# Patient Record
Sex: Male | Born: 1955 | Race: Black or African American | Hispanic: No | Marital: Single | Smoking: Former smoker
Health system: Southern US, Community
[De-identification: ages and names within clinical notes are randomized; demographics above are authoritative.]

## PROBLEM LIST (undated history)

## (undated) DIAGNOSIS — Z789 Other specified health status: Secondary | ICD-10-CM

## (undated) DIAGNOSIS — K8020 Calculus of gallbladder without cholecystitis without obstruction: Secondary | ICD-10-CM

## (undated) HISTORY — PX: PANCREAS SURGERY: SHX731

## (undated) HISTORY — DX: Other specified health status: Z78.9

## (undated) HISTORY — DX: Calculus of gallbladder without cholecystitis without obstruction: K80.20

## (undated) NOTE — *Deleted (*Deleted)
Patient ID: Tanner Thomas, male   DOB: 06/03/1955, 42 y.o.   MRN: [id number]    Progress Note   Subjective  Day # 2   Objective   Vital signs in last 24 hours: Temp:  [92.3 F (33.5 C)-98.6 F (37 C)] 98.6 F (37 C) (10/15 0624) Pulse Rate:  [45-65] 59 (10/15 0624) Resp:  [10-18] 18 (10/15 0624) BP: (129-168)/(69-91) 137/88 (10/15 0624) SpO2:  [98 %-100 %] 99 % (10/15 0624) Weight:  [66.2 kg] 66.2 kg (10/14 0947) Last BM Date: 02/04/20 General:    white male in NAD Heart:  Regular rate and rhythm; no murmurs Lungs: Respirations even and unlabored, lungs CTA bilaterally Abdomen:  Soft, nontender and nondistended. Normal bowel sounds. Extremities:  Without edema. Neurologic:  Alert and oriented,  grossly normal neurologically. Psych:  Cooperative. Normal mood and affect.  Intake/Output from previous day: 10/14 0701 - 10/15 0700 In: 1059.9 [P.O.:75; I.V.:984.9] Out: 1900 [Urine:1900] Intake/Output this shift: No intake/output data recorded.  Lab Results: Recent Labs    02/05/20 1620 02/05/20 2119 02/06/20 0429  WBC 8.0  --   --   HGB 13.6 12.8* 13.1  HCT 41.8 38.1* 40.1  PLT 112*  --   --    BMET Recent Labs    02/05/20 1620  NA 136  K 3.9  CL 104  CO2 22  GLUCOSE 128*  BUN 13  CREATININE 0.84  CALCIUM 8.7*   LFT Recent Labs    02/05/20 1620  PROT 7.7  ALBUMIN 4.0  AST 25  ALT 13  ALKPHOS 62  BILITOT 0.8   PT/INR No results for input(s): LABPROT, INR in the last 72 hours.  Studies/Results: DG Abd 1 View - KUB  Result Date: 02/05/2020 CLINICAL DATA:  Post ERCP.  Acute pancreatitis. EXAM: ABDOMEN - 1 VIEW COMPARISON:  CT abdomen pelvis 01/16/2019 FINDINGS: Normal bowel gas pattern. Small pancreatic calcifications noted in the head of the pancreas. Biliary stent noted. Cholecystectomy clips. Calcifications on either side of the L3 vertebral body appear to be in the anterior abdominal wall based on prior CT. IMPRESSION: No acute abnormality.  Pancreatic calcifications compatible with chronic pancreatitis. Biliary stent in good position. Electronically Signed   By: Marlan Palau M.D.   On: 02/05/2020 13:50   DG CHEST PORT 1 VIEW  Result Date: 02/05/2020 CLINICAL DATA:  Post ERCP.  Acute pancreatitis. EXAM: PORTABLE CHEST 1 VIEW COMPARISON:  01/19/2019 FINDINGS: The heart size and mediastinal contours are within normal limits. Both lungs are clear. The visualized skeletal structures are unremarkable. Surgical staples and scarring right apex. IMPRESSION: No active disease. Electronically Signed   By: Marlan Palau M.D.   On: 02/05/2020 13:52   DG ERCP  Result Date: 02/05/2020 CLINICAL DATA:  ERCP with sphincterotomy and stone extraction. Stent exchange for CBD stricture. EXAM: ERCP TECHNIQUE: Multiple spot images obtained with the fluoroscopic device and submitted for interpretation post-procedure. COMPARISON:  KUB 02/05/2020 FINDINGS: Plastic biliary stent in place placed in the common bile the beginning duct and contrast injected of the procedure. Wire placed into the common bile duct which is dilated with multiple filling defects consistent with calculi. Cholecystectomy clips. Metal stent was placed at the end of the procedure in the common bile duct. IMPRESSION: Dilated common bile duct with multiple filling defects compatible with stones. Plastic stent removed and metal stent placed in the common bile duct. These images were submitted for radiologic interpretation only. Please see the procedural report for the amount of contrast  and the fluoroscopy time utilized. Electronically Signed   By: Marlan Palau M.D.   On: 02/05/2020 13:55       Assessment / Plan:   ***        Active Problems:   Acute upper GI bleed     LOS: 0 days   Amy Esterwood PA-C 02/06/2020, 8:40 AM

---

## 2018-12-23 ENCOUNTER — Other Ambulatory Visit: Payer: Self-pay

## 2018-12-23 ENCOUNTER — Encounter (HOSPITAL_COMMUNITY): Payer: Self-pay | Admitting: Emergency Medicine

## 2018-12-23 ENCOUNTER — Emergency Department (HOSPITAL_COMMUNITY)
Admission: EM | Admit: 2018-12-23 | Discharge: 2018-12-23 | Disposition: A | Discharge status: Home / Self Care | Payer: Self-pay | Attending: Emergency Medicine | Admitting: Emergency Medicine

## 2018-12-23 ENCOUNTER — Emergency Department (HOSPITAL_COMMUNITY): Payer: Self-pay

## 2018-12-23 DIAGNOSIS — F17210 Nicotine dependence, cigarettes, uncomplicated: Secondary | ICD-10-CM | POA: Insufficient documentation

## 2018-12-23 DIAGNOSIS — M1711 Unilateral primary osteoarthritis, right knee: Secondary | ICD-10-CM | POA: Insufficient documentation

## 2018-12-23 MED ORDER — IBUPROFEN 600 MG PO TABS: 600.0000 mg | ORAL_TABLET | Freq: Four times a day (QID) | ORAL | 0 refills | Status: DC | PRN

## 2018-12-23 MED ORDER — IBUPROFEN 800 MG PO TABS
800.0000 mg | ORAL_TABLET | Freq: Once | ORAL | Status: AC
  Administered 2018-12-23: 12:00:00 800 mg via ORAL
  Filled 2018-12-23: qty 1

## 2018-12-23 NOTE — Discharge Instructions (Addendum)
Applying ice packs can help with pain if especially achy if on your feet a lot.  A heating pad can also be soothing, try both to determine which gives better relief.  You have severe arthritis as suspected (wear and tear of your joint). Call Dr. Aline Brochure for further evaluation and management of this condition.

## 2018-12-23 NOTE — ED Triage Notes (Signed)
Pt states "I am here for a checkup. I don't have a doctor any my knee and back have been hurting for several months."

## 2018-12-24 NOTE — ED Provider Notes (Signed)
[facility] EMERGENCY DEPARTMENT Provider Note   CSN: [id number] Arrival date & time: 12/23/18  0920     History   Chief Complaint Chief Complaint  Patient presents with  . Back Pain    HPI Tanner Thomas is a 63 y.o. male with no known significant past medical history but endorses has not had an MD in many years, recently moved to this area and has complaints of both low back pain and right knee pain which has been worsening in recent months.  He used to work in a job requiring constant standing and frequent lifting, but had to quit secondary to pain and moved to [place] to stay with family.  He rests the knee and back as much as possible, has had no other tx.  He denies weakness or numbness in his legs, no urinary or fecal incontinence or retention.  Denies history of cancer or IVDU.     The history is provided by the patient.    History reviewed. No pertinent past medical history.  There are no active problems to display for this patient.   Past Surgical History:  Procedure Laterality Date  . PANCREAS SURGERY          Home Medications    Prior to Admission medications   Medication Sig Start Date End Date Taking? Authorizing Provider  ibuprofen (ADVIL) 600 MG tablet Take 1 tablet (600 mg total) by mouth every 6 (six) hours as needed. 12/23/18   Evalee Jefferson, PA-C    Family History History reviewed. No pertinent family history.  Social History Social History   Tobacco Use  . Smoking status: Current Every Day Smoker    Packs/day: 1.00    Types: Cigarettes  Substance Use Topics  . Alcohol use: Yes    Comment: 2-3 days per week  . Drug use: Yes    Types: Marijuana     Allergies   Patient has no known allergies.   Review of Systems Review of Systems  Constitutional: Negative for fever.  Respiratory: Negative for shortness of breath.   Cardiovascular: Negative for chest pain and leg swelling.  Gastrointestinal: Negative for abdominal distention, abdominal pain  and constipation.  Genitourinary: Negative for difficulty urinating, dysuria, flank pain, frequency and urgency.  Musculoskeletal: Positive for arthralgias and back pain. Negative for gait problem and joint swelling.  Skin: Negative for rash.  Neurological: Negative for weakness and numbness.     Physical Exam Updated Vital Signs BP (!) 129/102 (BP Location: Right Arm)   Pulse (!) 53   Temp 98.6 F (37 C) (Oral)   Resp 16   Ht 5\' 9"  (1.753 m)   Wt 68 kg   SpO2 100%   BMI 22.15 kg/m   Physical Exam Vitals signs and nursing note reviewed.  Constitutional:      Appearance: He is well-developed.  HENT:     Head: Normocephalic.  Eyes:     Conjunctiva/sclera: Conjunctivae normal.  Neck:     Musculoskeletal: Normal range of motion and neck supple.  Cardiovascular:     Rate and Rhythm: Normal rate.     Comments: Pedal pulses normal. Pulmonary:     Effort: Pulmonary effort is normal.  Abdominal:     General: Bowel sounds are normal. There is no distension.     Palpations: Abdomen is soft. There is no mass.  Musculoskeletal:     Right knee: He exhibits decreased range of motion and abnormal patellar mobility. He exhibits no swelling, no effusion and  no erythema. Tenderness found. Medial joint line tenderness noted.     Lumbar back: He exhibits no tenderness, no bony tenderness, no swelling, no edema and no spasm.     Comments: Moderate persistent crepitus with ROM right knee. No effusion appreciated. No erythema.   Skin:    General: Skin is warm and dry.  Neurological:     Mental Status: He is alert.     Sensory: No sensory deficit.     Motor: No tremor or atrophy.     Gait: Gait normal.     Deep Tendon Reflexes:     Reflex Scores:      Patellar reflexes are 2+ on the right side and 2+ on the left side.      Achilles reflexes are 2+ on the right side and 2+ on the left side.    Comments: No strength deficit noted in hip and knee flexor and extensor muscle groups.  Ankle  flexion and extension intact.      ED Treatments / Results  Labs (all labs ordered are listed, but only abnormal results are displayed) Labs Reviewed - No data to display  EKG None  Radiology Dg Lumbar Spine Complete  Result Date: 12/23/2018 CLINICAL DATA:  Low back pain and right leg pain EXAM: LUMBAR SPINE - COMPLETE 4+ VIEW COMPARISON:  None. FINDINGS: Six non rib-bearing lumbar type vertebral segments. Vertebral body heights are maintained. No fracture identified. Relative preservation of the intervertebral disc spaces. Mild multilevel endplate spurring. Minimal lower lumbar facet arthrosis. IMPRESSION: 1. No acute fracture or static subluxation of the lumbar spine. 2. Mild degenerative changes. Electronically Signed   By: Davina Poke M.D.   On: 12/23/2018 12:30   Dg Knee Complete 4 Views Right  Result Date: 12/23/2018 CLINICAL DATA:  Pain EXAM: RIGHT KNEE - COMPLETE 4+ VIEW COMPARISON:  None. FINDINGS: No fracture or dislocation of the right knee. 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 Moderate, nonspecific knee joint effusion. Heterotopic ossification about the lateral distal femur. IMPRESSION: No fracture or dislocation of the right knee. 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 Moderate, nonspecific knee joint effusion. Heterotopic ossification about the lateral distal femur, consistent with prior trauma. Electronically Signed   By: Eddie Candle M.D.   On: 12/23/2018 12:29    Procedures Procedures (including critical care time)  Medications Ordered in ED Medications  ibuprofen (ADVIL) tablet 800 mg (800 mg Oral Given 12/23/18 1157)     Initial Impression / Assessment and Plan / ED Course  I have reviewed the triage vital signs and the nursing notes.  Pertinent labs & imaging  results that were available during my care of the patient were reviewed by me and considered in my medical decision making (see chart for details).        Pt with advanced osteoarthritis right knee.  Imaging reviewed and discussed with pt.  Advised that he will benefit from ortho evaluation, referral given.  He was also desirous of obtaining primary care - referrals given for this.  Prescribed ibuprofen, discussed role of ice/heat tx.  Activity as tolerated.  Lumbar exam and imaging unremarkable, no signs/sx of radiculopathy or cauda equina.  The patient appears reasonably screened and/or stabilized for discharge and I doubt any other medical condition or other [facility] requiring further screening, evaluation, or treatment in the ED at this  time prior to discharge.   Final Clinical Impressions(s) / ED Diagnoses   Final diagnoses:  Osteoarthritis of right knee, unspecified osteoarthritis type    ED Discharge Orders         Ordered    ibuprofen (ADVIL) 600 MG tablet  Every 6 hours PRN     12/23/18 1302           Evalee Jefferson, PA-C 12/24/18 [place], [place], DO 12/26/18 1545

## 2019-01-16 ENCOUNTER — Emergency Department (HOSPITAL_COMMUNITY): Payer: Self-pay

## 2019-01-16 ENCOUNTER — Ambulatory Visit
Admission: EM | Admit: 2019-01-16 | Discharge: 2019-01-16 | Disposition: A | Discharge status: Home / Self Care | Payer: Self-pay

## 2019-01-16 ENCOUNTER — Encounter (HOSPITAL_COMMUNITY): Payer: Self-pay | Admitting: Emergency Medicine

## 2019-01-16 ENCOUNTER — Other Ambulatory Visit: Payer: Self-pay

## 2019-01-16 ENCOUNTER — Inpatient Hospital Stay (HOSPITAL_COMMUNITY)
Admission: EM | Admit: 2019-01-16 | Discharge: 2019-01-19 | DRG: 689 | Disposition: A | Discharge status: Home / Self Care | Payer: Self-pay | Source: Ambulatory Visit | Attending: Family Medicine | Admitting: Family Medicine

## 2019-01-16 DIAGNOSIS — R1084 Generalized abdominal pain: Secondary | ICD-10-CM

## 2019-01-16 DIAGNOSIS — R0600 Dyspnea, unspecified: Secondary | ICD-10-CM

## 2019-01-16 DIAGNOSIS — R0689 Other abnormalities of breathing: Secondary | ICD-10-CM

## 2019-01-16 DIAGNOSIS — B9620 Unspecified Escherichia coli [E. coli] as the cause of diseases classified elsewhere: Secondary | ICD-10-CM | POA: Diagnosis present

## 2019-01-16 DIAGNOSIS — K831 Obstruction of bile duct: Secondary | ICD-10-CM | POA: Diagnosis present

## 2019-01-16 DIAGNOSIS — N12 Tubulo-interstitial nephritis, not specified as acute or chronic: Secondary | ICD-10-CM

## 2019-01-16 DIAGNOSIS — F1010 Alcohol abuse, uncomplicated: Secondary | ICD-10-CM | POA: Diagnosis present

## 2019-01-16 DIAGNOSIS — E43 Unspecified severe protein-calorie malnutrition: Secondary | ICD-10-CM | POA: Diagnosis present

## 2019-01-16 DIAGNOSIS — K861 Other chronic pancreatitis: Secondary | ICD-10-CM | POA: Diagnosis present

## 2019-01-16 DIAGNOSIS — R402362 Coma scale, best motor response, obeys commands, at arrival to emergency department: Secondary | ICD-10-CM | POA: Diagnosis present

## 2019-01-16 DIAGNOSIS — R000 Tachycardia, unspecified: Secondary | ICD-10-CM

## 2019-01-16 DIAGNOSIS — R402252 Coma scale, best verbal response, oriented, at arrival to emergency department: Secondary | ICD-10-CM | POA: Diagnosis present

## 2019-01-16 DIAGNOSIS — D649 Anemia, unspecified: Secondary | ICD-10-CM | POA: Diagnosis present

## 2019-01-16 DIAGNOSIS — Z681 Body mass index (BMI) 19 or less, adult: Secondary | ICD-10-CM

## 2019-01-16 DIAGNOSIS — I951 Orthostatic hypotension: Secondary | ICD-10-CM | POA: Diagnosis present

## 2019-01-16 DIAGNOSIS — R402142 Coma scale, eyes open, spontaneous, at arrival to emergency department: Secondary | ICD-10-CM | POA: Diagnosis present

## 2019-01-16 DIAGNOSIS — F17210 Nicotine dependence, cigarettes, uncomplicated: Secondary | ICD-10-CM | POA: Diagnosis present

## 2019-01-16 DIAGNOSIS — E860 Dehydration: Secondary | ICD-10-CM | POA: Diagnosis present

## 2019-01-16 DIAGNOSIS — I9589 Other hypotension: Secondary | ICD-10-CM

## 2019-01-16 DIAGNOSIS — N10 Acute tubulo-interstitial nephritis: Principal | ICD-10-CM | POA: Diagnosis present

## 2019-01-16 DIAGNOSIS — E871 Hypo-osmolality and hyponatremia: Secondary | ICD-10-CM | POA: Diagnosis present

## 2019-01-16 DIAGNOSIS — Z20828 Contact with and (suspected) exposure to other viral communicable diseases: Secondary | ICD-10-CM | POA: Diagnosis present

## 2019-01-16 HISTORY — DX: Tubulo-interstitial nephritis, not specified as acute or chronic: N12

## 2019-01-16 LAB — URINALYSIS, ROUTINE W REFLEX MICROSCOPIC
Bilirubin Urine: NEGATIVE
Glucose, UA: NEGATIVE mg/dL
Ketones, ur: NEGATIVE mg/dL
Nitrite: NEGATIVE
Protein, ur: 30 mg/dL — AB
Specific Gravity, Urine: 1.033 — ABNORMAL HIGH (ref 1.005–1.030)
WBC, UA: >50 WBC/hpf — ABNORMAL HIGH (ref 0–5)
pH: 6 (ref 5.0–8.0)

## 2019-01-16 LAB — COMPREHENSIVE METABOLIC PANEL
ALT: 20 U/L (ref 0–44)
AST: 32 U/L (ref 15–41)
Albumin: 3.2 g/dL — ABNORMAL LOW (ref 3.5–5.0)
Alkaline Phosphatase: 60 U/L (ref 38–126)
Anion gap: 11 (ref 5–15)
BUN: 16 mg/dL (ref 6–20)
CO2: 23 mmol/L (ref 22–32)
Calcium: 8.9 mg/dL (ref 8.9–10.3)
Chloride: 94 mmol/L — ABNORMAL LOW (ref 98–111)
Creatinine, Ser: 1.18 mg/dL (ref 0.61–1.24)
GFR calc Af Amer: >60 mL/min (ref >60)
GFR calc non Af Amer: >60 mL/min (ref >60)
Glucose, Bld: 135 mg/dL — ABNORMAL HIGH (ref 70–99)
Potassium: 4 mmol/L (ref 3.5–5.1)
Sodium: 128 mmol/L — ABNORMAL LOW (ref 135–145)
Total Bilirubin: 1.1 mg/dL (ref 0.3–1.2)
Total Protein: 8.1 g/dL (ref 6.5–8.1)

## 2019-01-16 LAB — RAPID URINE DRUG SCREEN, HOSP PERFORMED
Amphetamines: NOT DETECTED
Barbiturates: NOT DETECTED
Benzodiazepines: NOT DETECTED
Cocaine: NOT DETECTED
Opiates: NOT DETECTED
Tetrahydrocannabinol: POSITIVE — AB

## 2019-01-16 LAB — CBC
HCT: 31.9 % — ABNORMAL LOW (ref 39.0–52.0)
Hemoglobin: 10.1 g/dL — ABNORMAL LOW (ref 13.0–17.0)
MCH: 29.8 pg (ref 26.0–34.0)
MCHC: 31.7 g/dL (ref 30.0–36.0)
MCV: 94.1 fL (ref 80.0–100.0)
Platelets: 274 10*3/uL (ref 150–400)
RBC: 3.39 MIL/uL — ABNORMAL LOW (ref 4.22–5.81)
RDW: 16.7 % — ABNORMAL HIGH (ref 11.5–15.5)
WBC: 11.1 10*3/uL — ABNORMAL HIGH (ref 4.0–10.5)
nRBC: 0 % (ref 0.0–0.2)

## 2019-01-16 LAB — LIPASE, BLOOD: Lipase: 16 U/L (ref 11–51)

## 2019-01-16 LAB — ETHANOL: Alcohol, Ethyl (B): <10 mg/dL (ref <10)

## 2019-01-16 MED ORDER — SODIUM CHLORIDE 0.9 % IV SOLN
1.0000 g | Freq: Once | INTRAVENOUS | Status: AC
  Administered 2019-01-16: 1 g via INTRAVENOUS
  Filled 2019-01-16: qty 10

## 2019-01-16 MED ORDER — IOHEXOL 300 MG/ML  SOLN
100.0000 mL | Freq: Once | INTRAMUSCULAR | Status: AC | PRN
  Administered 2019-01-16: 100 mL via INTRAVENOUS

## 2019-01-16 MED ORDER — SODIUM CHLORIDE 0.9 % IV BOLUS
1000.0000 mL | Freq: Once | INTRAVENOUS | Status: AC
  Administered 2019-01-16: 1000 mL via INTRAVENOUS

## 2019-01-16 NOTE — ED Notes (Signed)
Advised patient we needed urine specimen.  Urinal left at bedside.  °

## 2019-01-16 NOTE — Discharge Instructions (Addendum)
Recommending further evaluation and management in the ED for chronic abdominal/ back pain, weight loss (apx 25 lbs in 3 weeks), hypotension in office, and tachycardia in office.  Patient, and patient's sister aware and in agreement with this plan.  Will go by private vehicle to [facility] ED.

## 2019-01-16 NOTE — ED Provider Notes (Signed)
[facility] EMERGENCY DEPARTMENT Provider Note   CSN: [id number] Arrival date & time: 01/16/19  1135     History   Chief Complaint Chief Complaint  Patient presents with  . Abdominal Pain    HPI Tanner Thomas is a 63 y.o. male.     HPI   Tanner Thomas is a 63 y.o. male who presents to the emergency department after evaluation at a local urgent care.  He complains of right-sided lower abdominal pain that radiates to his right back.  He states he has daily abdominal pain for 2 years, but the pain has gradually been increasing for more than 1 week.  He also endorses a 25 pound weight loss within the last month.  He describes the pain as constant and is unaffected by food intake.  He also complains of dizziness upon position change or standing.  He describes the dizziness as brief.  He does admit to frequent alcohol use, states that he drinks 3 to 4 16 ounce beers per day. Last drink was 2 days ago.   He denies chest pain, shortness of breath, nausea or vomiting, dysuria, diarrhea or constipation or dark-colored or bloody stools.  No fever or chills.  He admits to a history of surgery to his pancreas, but he is unable to further explain the type of surgery he had.  He is moved from [place] to [place] to be closer to family.   oHistory reviewed. No pertinent past medical history.  There are no active problems to display for this patient.   Past Surgical History:  Procedure Laterality Date  . PANCREAS SURGERY       Home Medications    Prior to Admission medications   Medication Sig Start Date End Date Taking? Authorizing Provider  ibuprofen (ADVIL) 600 MG tablet Take 1 tablet (600 mg total) by mouth every 6 (six) hours as needed. 12/23/18  Yes IdolAlmyra Free, PA-C    Family History No family history on file.  Social History Social History   Tobacco Use  . Smoking status: Current Every Day Smoker    Packs/day: 0.50    Types: Cigarettes  . Smokeless tobacco: Never Used   Substance Use Topics  . Alcohol use: Yes    Comment: 5-6 beers a day  . Drug use: Not Currently    Types: Marijuana     Allergies   Patient has no known allergies.   Review of Systems Review of Systems  Constitutional: Positive for unexpected weight change. Negative for appetite change, chills and fever.  Respiratory: Negative for cough and shortness of breath.   Cardiovascular: Negative for chest pain.  Gastrointestinal: Positive for abdominal pain. Negative for blood in stool, diarrhea, nausea and vomiting.  Genitourinary: Negative for decreased urine volume, difficulty urinating, dysuria and flank pain.  Musculoskeletal: Negative for back pain.  Skin: Negative for color change and rash.  Neurological: Positive for dizziness. Negative for syncope, facial asymmetry, speech difficulty, weakness, numbness and headaches.  Hematological: Negative for adenopathy.     Physical Exam Updated Vital Signs BP (!) 109/98 (BP Location: Right Arm)   Pulse (!) 110   Temp 98 F (36.7 C) (Oral)   Resp 18   Ht 5\' 9"  (1.753 m)   Wt 57.6 kg   SpO2 99%   BMI 18.75 kg/m   Physical Exam Vitals signs and nursing note reviewed.  Constitutional:      Appearance: Normal appearance. He is not toxic-appearing.     Comments: Pt is  somewhat frail appearing for stated age  HENT:     Mouth/Throat:     Mouth: Mucous membranes are moist.  Eyes:     General: No scleral icterus.    Extraocular Movements: Extraocular movements intact.     Pupils: Pupils are equal, round, and reactive to light.  Neck:     Musculoskeletal: Normal range of motion.  Cardiovascular:     Rate and Rhythm: Normal rate and regular rhythm.     Pulses: Normal pulses.  Pulmonary:     Effort: Pulmonary effort is normal. No respiratory distress.     Breath sounds: Normal breath sounds. No wheezing.  Abdominal:     General: Bowel sounds are normal.     Palpations: Abdomen is soft.     Tenderness: There is abdominal  tenderness (mild ttp of the right mid to lower abdomen.  no guarding or rebound tenderness.  no peritoneal signs.  ) in the right lower quadrant. There is no rebound. Negative signs include McBurney's sign.  Genitourinary:    Rectum: Guaiac result negative. No mass, tenderness or anal fissure.     Comments: Heme negative, brown stool on DRE.  No palpable masses Musculoskeletal: Normal range of motion.     Right lower leg: No edema.     Left lower leg: No edema.  Skin:    General: Skin is warm.     Capillary Refill: Capillary refill takes less than 2 seconds.     Findings: No rash.  Neurological:     General: No focal deficit present.     Mental Status: He is alert.     GCS: GCS eye subscore is 4. GCS verbal subscore is 5. GCS motor subscore is 6.     Sensory: Sensation is intact.     Motor: Motor function is intact. No weakness.     Coordination: Coordination is intact.     Comments: CN III-XII grossly intact.  Speech clear, mentating well      ED Treatments / Results  Labs (all labs ordered are listed, but only abnormal results are displayed) Labs Reviewed  COMPREHENSIVE METABOLIC PANEL - Abnormal; Notable for the following components:      Result Value   Sodium 128 (*)    Chloride 94 (*)    Glucose, Bld 135 (*)    Albumin 3.2 (*)    All other components within normal limits  CBC - Abnormal; Notable for the following components:   WBC 11.1 (*)    RBC 3.39 (*)    Hemoglobin 10.1 (*)    HCT 31.9 (*)    RDW 16.7 (*)    All other components within normal limits  LIPASE, BLOOD  ETHANOL  URINALYSIS, ROUTINE W REFLEX MICROSCOPIC  HEPATITIS PANEL, ACUTE  RAPID URINE DRUG SCREEN, HOSP PERFORMED  POC OCCULT BLOOD, ED    EKG None  Radiology No results found.  Procedures Procedures (including critical care time)  Medications Ordered in ED Medications  sodium chloride 0.9 % bolus 1,000 mL (has no administration in time range)     Initial Impression / Assessment and  Plan / ED Course  I have reviewed the triage vital signs and the nursing notes.  Pertinent labs & imaging results that were available during my care of the patient were reviewed by me and considered in my medical decision making (see chart for details).    Orthostatic VS for the past 24 hrs:  BP- Lying Pulse- Lying BP- Sitting Pulse- Sitting BP- Standing  at 0 minutes Pulse- Standing at 0 minutes  01/16/19 1515 120/77 65 118/78 74 105/82 93           Pt with right mid to lower abdominal pain.  Hx of same.  Hyponatremic, mildly orthostatic and anemic, will give IVF's.  Admits to hx of anemia.  Heme neg stool on DRE.  CT abd/pelvis pending.    1710  Discussed pt findings with Evalee Jefferson, PA-C who agrees to reassess and arrange dispo.    Final Clinical Impressions(s) / ED Diagnoses   Final diagnoses:  None    ED Discharge Orders    None       Kem Parkinson, PA-C 01/16/19 1744    Noemi Chapel, MD 01/18/19 [phone]

## 2019-01-16 NOTE — H&P (Signed)
History and Physical    Tanner Thomas [id number] DOB: 1956/02/16 DOA: 01/16/2019  PCP: Tanner Thomas   Patient coming from: Home  I have personally briefly reviewed patient's old medical records in [facility]  Chief Complaint: Abdominal pain, right flank pain  HPI: Tanner Thomas is a 63 y.o. male with medical history significant for alcohol abuse.  Presented to the ED with complaints of chronic intermittent lower abdominal pain over the past 1 to 2 years.  He tells me it is on the left lower side, but prior documentation states right lower side.  Over the past few days he has had new right flank pain.  No fever no chills.  No vomiting.  No loose stools.  No pain with urination. Patient also reports 25 pound weight loss over the past months.  Reports good p.o. intake.  He has never had a colonoscopy.  Family history of cancers.  Smokes half pack cigarettes daily.  No cough or difficulty breathing or chest pain. Patient moved from [place] to [place] about a month ago to be closer to family.  Currently lives with his sister.  Patient has not been receiving regular medical care.  He has hardly been to a hospital clinic except for when he had surgery on his pancreas about 5 years ago.  He is unsure what type of procedure was done or why he needed it.  ED Course: Temperature 98.  Blood pressure systolic 99991111.  WBC 11.1.  UA > 50 WBCs few bacteria.  UDS positive for marijuana. Sodium 128.  Blood alcohol level unremarkable.  Pelvis with contrast showed acute right pyelonephritis.  No abscess or hydronephrosis.  Also suggestive of cystitis.  Diffuse biliary ductal dilatation with distal common bile duct obstruction which appears to be due to chronic calcific pancreatitis.  In the ED he was weak and reported dizziness standing.  IV ceftriaxone started.  Hospitalist to admit for acute pyelonephritis.  Review of Systems: As Thomas HPI all other systems reviewed and negative.  History  reviewed. No pertinent past medical history.  Past Surgical History:  Procedure Laterality Date   PANCREAS SURGERY       reports that he has been smoking cigarettes. He has been smoking about 0.50 packs Thomas day. He has never used smokeless tobacco. He reports current alcohol use. He reports previous drug use. Drug: Marijuana.  No Known Allergies  No family history of cancers.   Prior to Admission medications   Medication Sig Start Date End Date Taking? Authorizing Provider  ibuprofen (ADVIL) 600 MG tablet Take 1 tablet (600 mg total) by mouth every 6 (six) hours as needed. 12/23/18  Yes Evalee Jefferson, PA-C    Physical Exam: Vitals:   01/16/19 1212 01/16/19 1517 01/16/19 1900 01/16/19 1930  BP:  120/77 93/71 114/75  Pulse:  65 73 (!) 59  Resp:  18 20 18   Temp:      TempSrc:      SpO2:  100% 100% 100%  Weight: 57.6 kg     Height: 5\' 9"  (1.753 m)       Constitutional: NAD, calm, comfortable Vitals:   01/16/19 1212 01/16/19 1517 01/16/19 1900 01/16/19 1930  BP:  120/77 93/71 114/75  Pulse:  65 73 (!) 59  Resp:  18 20 18   Temp:      TempSrc:      SpO2:  100% 100% 100%  Weight: 57.6 kg     Height: 5\' 9"  (1.753 m)  Eyes: PERRL, lids and conjunctivae normal ENMT: Mucous membranes are moist. Posterior pharynx clear of any exudate or lesions.Normal dentition.  Neck: normal, supple, no masses, no thyromegaly Respiratory: clear to auscultation bilaterally, no wheezing, no crackles. Normal respiratory effort. No accessory muscle use.  Cardiovascular: Regular rate and rhythm, no murmurs / rubs / gallops. No extremity edema. 2+ pedal pulses. No carotid bruits.  Abdomen: no tenderness, no masses palpated. No hepatosplenomegaly. Bowel sounds positive.  Musculoskeletal: no clubbing / cyanosis. No joint deformity upper and lower extremities. Good ROM, no contractures. Normal muscle tone.  Skin: no rashes, lesions, ulcers. No induration Neurologic: CN 2-12 grossly intact. Strength  5/5 in all 4.  Psychiatric: Normal judgment and insight. Alert and oriented x 3. Normal mood.   Labs on Admission: I have personally reviewed following labs and imaging studies  CBC: Recent Labs  Lab 01/16/19 1324  WBC 11.1*  HGB 10.1*  HCT 31.9*  MCV 94.1  PLT 123456   Basic Metabolic Panel: Recent Labs  Lab 01/16/19 1324  NA 128*  K 4.0  CL 94*  CO2 23  GLUCOSE 135*  BUN 16  CREATININE 1.18  CALCIUM 8.9   Liver Function Tests: Recent Labs  Lab 01/16/19 1324  AST 32  ALT 20  ALKPHOS 60  BILITOT 1.1  PROT 8.1  ALBUMIN 3.2*   Recent Labs  Lab 01/16/19 1324  LIPASE 16   Urine analysis:    Component Value Date/Time   COLORURINE AMBER (A) 01/16/2019 1724   APPEARANCEUR CLOUDY (A) 01/16/2019 1724   LABSPEC 1.033 (H) 01/16/2019 1724   PHURINE 6.0 01/16/2019 1724   GLUCOSEU NEGATIVE 01/16/2019 1724   HGBUR SMALL (A) 01/16/2019 1724   BILIRUBINUR NEGATIVE 01/16/2019 1724   Lee 01/16/2019 1724   PROTEINUR 30 (A) 01/16/2019 1724   NITRITE NEGATIVE 01/16/2019 1724   LEUKOCYTESUR LARGE (A) 01/16/2019 1724    Radiological Exams on Admission: Ct Abdomen Pelvis W Contrast  Result Date: 01/16/2019 CLINICAL DATA:  Progressive right-sided abdominal pain. Unintentional weight loss over past 2 months. EXAM: CT ABDOMEN AND PELVIS WITH CONTRAST TECHNIQUE: Multidetector CT imaging of the abdomen and pelvis was performed using the standard protocol following bolus administration of intravenous contrast. CONTRAST:  170mL OMNIPAQUE IOHEXOL 300 MG/ML  SOLN COMPARISON:  None. FINDINGS: Lower Chest: No acute findings. Hepatobiliary: No hepatic masses identified. Gallbladder is unremarkable. Diffuse biliary ductal dilatation is seen to the level of the pancreatic head, with common bile duct measuring approximately 13 mm in diameter. Pancreas: Diffuse pancreatic calcifications are seen, consistent with chronic pancreatitis. No pancreatic mass identified. No evidence of  acute peripancreatic inflammatory changes. A new rim enhancing fluid collection is seen adjacent to the pancreatic tail which measures 2.2 x 1.2 cm, consistent with a small pancreatic pseudocyst. Spleen: Within normal limits in size and appearance. Adrenals/Urinary Tract: Multiple ill-defined areas of decreased parenchymal enhancement are seen in the right kidney with mild perinephric stranding. These findings are consistent with pyelonephritis. No evidence of renal mass or abscess. No evidence of ureteral calculi or hydronephrosis. Normal appearance of left kidney. Mild diffuse bladder wall thickening, suspicious for cystitis. Stomach/Bowel: No evidence of obstruction, inflammatory process or abnormal fluid collections. Vascular/Lymphatic: No pathologically enlarged lymph nodes. No abdominal aortic aneurysm. Aortic atherosclerosis. 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 Reproductive:  No mass or other significant abnormality. Other:  None. Musculoskeletal:  No suspicious bone lesions identified. IMPRESSION: Acute right pyelonephritis. No evidence of  renal abscess or hydronephrosis. Mild diffuse bladder wall thickening, suspicious for cystitis. Diffuse biliary ductal dilatation, with distal common bile duct obstruction which appears to be due to chronic calcific pancreatitis. No radiographic evidence of pancreatic mass or acute pancreatitis. Small pancreatic pseudocyst adjacent to pancreatic tail. Chronic splenic vein thrombosis, with portosystemic collaterals in gastrohepatic and gastrosplenic ligaments. Electronically Signed   By: Marlaine Hind M.D.   On: 01/16/2019 17:00    EKG: None   Assessment/Plan Active Problems:   Pyelonephritis  Acute pyelonephritis-right flank pain.  Rules out for sepsis.  WBC 11.1.  Denies dysuria.  CT also suggest cystitis. -IV ceftriaxone 1 g daily -Follow-up urine cultures -CBC, BMP  a.m.  Hyponatremia-sodium 128.  Likely secondary to alcohol abuse/beer [place] mania.  1 L bolus normal saline given in ED. - n/s 100cc/hr x 15 hrs - BMP a.m  Weight loss-reports 25 pound weight loss in 2 months.  Tobacco and alcohol abuse.  Never had colonoscopy.  No known family history of cancers.  Does not have a primary care provider.  CT abdomen and pelvis suggest diffuse biliary ductal dilatation, distal common bile duct obstruction likely due to chronic calcific pancreatitis.  -Will need to establish care with outpatient provider.  Alcohol abuse- drinks 2 to 4 16 ounce beers Thomas day. Last drink yesterday morning 9/23. - CIWA  - thiamine, folate, multivitamins.  Tobacco abuse- Smokes half pack cigarettes daily. -Nicotine patch   DVT prophylaxis: Lovenox Code Status: Full Family Communication: None at bedside Disposition Plan: Thomas rounding team Consults called: None Admission status: Inpt, Med-surg. I certify that at the point of admission it is my clinical judgment that the patient will require inpatient hospital care spanning beyond 2 midnights from the point of admission due to high intensity of service, high risk for further deterioration and high frequency of surveillance required. The following factors support the patient status of inpatient: Acute pyelonephritis requiring IV antibiotics, while awaiting urine culture results.    Bethena Roys MD [facility]  01/16/2019, 9:14 PM

## 2019-01-16 NOTE — Clinical Social Work Note (Signed)
Called patient from my office.  Confirmed that he had no insurance, no PCP and no income.  He recently [about a month] moved from [place], and is staying with sister.  He gave permission to give his information with Diane from [facility].  Called Diane, who will follow up aith patient tomorrow.

## 2019-01-16 NOTE — ED Provider Notes (Signed)
Pt signed out at shift change.  Pt with chronic RLQ pain, etoh abuser, unintentional weight loss of 25 lbs in past 3 weeks.  Labs and CT imaging reviewed.  Discussed with pt who does endorse aching pain in his right flank and frequent urination, denies dysuria.  Generalized weakness.   Pt with hyponatremia, hypotension who is orthostatic with pyelonephritis.  Rocephin ordered.  IV fluids 1 L given. Will call for inpatient admission for further tx of sx.   Results for orders placed or performed during the hospital encounter of 01/16/19  Lipase, blood  Result Value Ref Range   Lipase 16 11 - 51 U/L  Comprehensive metabolic panel  Result Value Ref Range   Sodium 128 (L) 135 - 145 mmol/L   Potassium 4.0 3.5 - 5.1 mmol/L   Chloride 94 (L) 98 - 111 mmol/L   CO2 23 22 - 32 mmol/L   Glucose, Bld 135 (H) 70 - 99 mg/dL   BUN 16 6 - 20 mg/dL   Creatinine, Ser 1.18 0.61 - 1.24 mg/dL   Calcium 8.9 8.9 - 10.3 mg/dL   Total Protein 8.1 6.5 - 8.1 g/dL   Albumin 3.2 (L) 3.5 - 5.0 g/dL   AST 32 15 - 41 U/L   ALT 20 0 - 44 U/L   Alkaline Phosphatase 60 38 - 126 U/L   Total Bilirubin 1.1 0.3 - 1.2 mg/dL   GFR calc non Af Amer >60 >60 mL/min   GFR calc Af Amer >60 >60 mL/min   Anion gap 11 5 - 15  CBC  Result Value Ref Range   WBC 11.1 (H) 4.0 - 10.5 K/uL   RBC 3.39 (L) 4.22 - 5.81 MIL/uL   Hemoglobin 10.1 (L) 13.0 - 17.0 g/dL   HCT 31.9 (L) 39.0 - 52.0 %   MCV 94.1 80.0 - 100.0 fL   MCH 29.8 26.0 - 34.0 pg   MCHC 31.7 30.0 - 36.0 g/dL   RDW 16.7 (H) 11.5 - 15.5 %   Platelets 274 150 - 400 K/uL   nRBC 0.0 0.0 - 0.2 %  Urinalysis, Routine w reflex microscopic  Result Value Ref Range   Color, Urine AMBER (A) YELLOW   APPearance CLOUDY (A) CLEAR   Specific Gravity, Urine 1.033 (H) 1.005 - 1.030   pH 6.0 5.0 - 8.0   Glucose, UA NEGATIVE NEGATIVE mg/dL   Hgb urine dipstick SMALL (A) NEGATIVE   Bilirubin Urine NEGATIVE NEGATIVE   Ketones, ur NEGATIVE NEGATIVE mg/dL   Protein, ur 30 (A) NEGATIVE  mg/dL   Nitrite NEGATIVE NEGATIVE   Leukocytes,Ua LARGE (A) NEGATIVE   RBC / HPF 0-5 0 - 5 RBC/hpf   WBC, UA >50 (H) 0 - 5 WBC/hpf   Bacteria, UA FEW (A) NONE SEEN   Squamous Epithelial / LPF 0-5 0 - 5   WBC Clumps PRESENT    Mucus PRESENT    Non Squamous Epithelial 0-5 (A) NONE SEEN  Ethanol  Result Value Ref Range   Alcohol, Ethyl (B) <10 <10 mg/dL  Urine rapid drug screen (hosp performed)  Result Value Ref Range   Opiates NONE DETECTED NONE DETECTED   Cocaine NONE DETECTED NONE DETECTED   Benzodiazepines NONE DETECTED NONE DETECTED   Amphetamines NONE DETECTED NONE DETECTED   Tetrahydrocannabinol POSITIVE (A) NONE DETECTED   Barbiturates NONE DETECTED NONE DETECTED   Dg Lumbar Spine Complete  Result Date: 12/23/2018 CLINICAL DATA:  Low back pain and right leg pain EXAM: LUMBAR SPINE -  COMPLETE 4+ VIEW COMPARISON:  None. FINDINGS: Six non rib-bearing lumbar type vertebral segments. Vertebral body heights are maintained. No fracture identified. Relative preservation of the intervertebral disc spaces. Mild multilevel endplate spurring. Minimal lower lumbar facet arthrosis. IMPRESSION: 1. No acute fracture or static subluxation of the lumbar spine. 2. Mild degenerative changes. Electronically Signed   By: Davina Poke M.D.   On: 12/23/2018 12:30   Ct Abdomen Pelvis W Contrast  Result Date: 01/16/2019 CLINICAL DATA:  Progressive right-sided abdominal pain. Unintentional weight loss over past 2 months. EXAM: CT ABDOMEN AND PELVIS WITH CONTRAST TECHNIQUE: Multidetector CT imaging of the abdomen and pelvis was performed using the standard protocol following bolus administration of intravenous contrast. CONTRAST:  133mL OMNIPAQUE IOHEXOL 300 MG/ML  SOLN COMPARISON:  None. FINDINGS: Lower Chest: No acute findings. Hepatobiliary: No hepatic masses identified. Gallbladder is unremarkable. Diffuse biliary ductal dilatation is seen to the level of the pancreatic head, with common bile duct  measuring approximately 13 mm in diameter. Pancreas: Diffuse pancreatic calcifications are seen, consistent with chronic pancreatitis. No pancreatic mass identified. No evidence of acute peripancreatic inflammatory changes. A new rim enhancing fluid collection is seen adjacent to the pancreatic tail which measures 2.2 x 1.2 cm, consistent with a small pancreatic pseudocyst. Spleen: Within normal limits in size and appearance. Adrenals/Urinary Tract: Multiple ill-defined areas of decreased parenchymal enhancement are seen in the right kidney with mild perinephric stranding. These findings are consistent with pyelonephritis. No evidence of renal mass or abscess. No evidence of ureteral calculi or hydronephrosis. Normal appearance of left kidney. Mild diffuse bladder wall thickening, suspicious for cystitis. Stomach/Bowel: No evidence of obstruction, inflammatory process or abnormal fluid collections. Vascular/Lymphatic: No pathologically enlarged lymph nodes. No abdominal aortic aneurysm. Aortic atherosclerosis. 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 Reproductive:  No mass or other significant abnormality. Other:  None. Musculoskeletal:  No suspicious bone lesions identified. IMPRESSION: Acute right pyelonephritis. No evidence of renal abscess or hydronephrosis. Mild diffuse bladder wall thickening, suspicious for cystitis. Diffuse biliary ductal dilatation, with distal common bile duct obstruction which appears to be due to chronic calcific pancreatitis. No radiographic evidence of pancreatic mass or acute pancreatitis. Small pancreatic pseudocyst adjacent to pancreatic tail. Chronic splenic vein thrombosis, with portosystemic collaterals in gastrohepatic and gastrosplenic ligaments. Electronically Signed   By: Marlaine Hind M.D.   On: 01/16/2019 17:00   Dg Knee Complete 4 Views Right  Result Date: 12/23/2018 CLINICAL DATA:  Pain  EXAM: RIGHT KNEE - COMPLETE 4+ VIEW COMPARISON:  None. FINDINGS: No fracture or dislocation of the right knee. 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 Moderate, nonspecific knee joint effusion. Heterotopic ossification about the lateral distal femur. IMPRESSION: No fracture or dislocation of the right knee. 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 Moderate, nonspecific knee joint effusion. Heterotopic ossification about the lateral distal femur, consistent with prior trauma. Electronically Signed   By: Eddie Candle M.D.   On: 12/23/2018 12:29      Evalee Jefferson, Hershal Coria 01/16/19 Newell, Waller, DO 01/21/19 1112

## 2019-01-16 NOTE — ED Triage Notes (Signed)
ABD pain to RLQ that radiates to RT lower back x 2 years.  Seen at urgent care today for weight loss of 25 lbs within the last 3 weeks.

## 2019-01-16 NOTE — ED Triage Notes (Signed)
Pt presents to UC w/ c/o abdominal pain for 1 year. Pt is with his sister who is present in triage room. He states he has had decreased appetite, weight loss x2 months. Pt's sister states EMS yesterday bc he was weak and couldn't get up from bed. Pt denies diarrhea, n/v. Pt states he feels dizzy when he stands.

## 2019-01-16 NOTE — ED Provider Notes (Signed)
[facility]   ZP:[id number] 01/16/19 Arrival Time: 1048  CC: ABDOMINAL pain  SUBJECTIVE: HPI obtained from patient and patient's sister:  Tanner Thomas is a 63 y.o. male who presents with complaint of abdominal pain x >1 year.  Denies a precipitating event, or trauma.  Localizes pain to RT low back  Describes as intermittent, worsening and throbbing in character.  Has not tried OTC medications.  Denies alleviating or aggravating factors.  Denies association with eating or using bathroom.  Denies similar symptoms in the past.  Last BM this morning and normal for patient.  Complains of associated dizziness, weakness, decreased appetite, and weight loss x 2 months.  Sister called EMS yesterday because patient was too weak to get out of bed.  Refused transfer to ED.  Denies fever, chills, nausea, vomiting, chest pain, SOB, diarrhea, constipation, hematochezia, melena, dysuria, difficulty urinating, increased frequency or urgency, flank pain, loss of bowel or bladder function.  Admits to alcohol use.  Recent alcohol use last night.    Patient is a poor historian, cannot tell me more information about the surgery he had on his pancreas.  Moved from [place] to  to be closer to family.     ROS: As per HPI.  All other pertinent ROS negative.     History reviewed. No pertinent past medical history. Past Surgical History:  Procedure Laterality Date  . PANCREAS SURGERY     No Known Allergies No current facility-administered medications on file prior to encounter.    Current Outpatient Medications on File Prior to Encounter  Medication Sig Dispense Refill  . ibuprofen (ADVIL) 600 MG tablet Take 1 tablet (600 mg total) by mouth every 6 (six) hours as needed. 30 tablet 0   Social History   Socioeconomic History  . Marital status: Single    Spouse name: Not on file  . Number of children: Not on file  . Years of education: Not on file  . Highest education level: Not on file  Occupational  History  . Not on file  Social Needs  . Financial resource strain: Not on file  . Food insecurity    Worry: Not on file    Inability: Not on file  . Transportation needs    Medical: Not on file    Non-medical: Not on file  Tobacco Use  . Smoking status: Current Every Day Smoker    Packs/day: 1.00    Types: Cigarettes  . Smokeless tobacco: Never Used  Substance and Sexual Activity  . Alcohol use: Yes    Comment: 2-3 days per week  . Drug use: Not Currently    Types: Marijuana  . Sexual activity: Not on file  Lifestyle  . Physical activity    Days per week: Not on file    Minutes per session: Not on file  . Stress: Not on file  Relationships  . Social Herbalist on phone: Not on file    Gets together: Not on file    Attends religious service: Not on file    Active member of club or organization: Not on file    Attends meetings of clubs or organizations: Not on file    Relationship status: Not on file  . Intimate partner violence    Fear of current or ex partner: Not on file    Emotionally abused: Not on file    Physically abused: Not on file    Forced sexual activity: Not on file  Other Topics  Concern  . Not on file  Social History Narrative  . Not on file   History reviewed. No pertinent family history.   OBJECTIVE:  Vitals:   01/16/19 1056  BP: 93/66  Pulse: (!) 114  Resp: 16  Temp: 98.2 F (36.8 C)  TempSrc: Oral  SpO2: 97%    127LBS in office today.    General appearance: Alert; chronically ill appearing HEENT: NCAT. PERRL, EOMI grossly; Oropharynx clear.  Dry mucous membranes.  Lungs: clear to auscultation bilaterally without adventitious breath sounds Heart: Tachycardic  Abdomen: soft, non-distended; normal active bowel sounds; non-tender to light and deep palpation; nontender at McBurney's point; negative Murphy's sign; no guarding; no pulsatile mass; small hard mass approximately 1 cm in diameter in 9 o'clock position of navel, NTTP  (states this has been present since birth) Back: no CVA tenderness; TTP over LT low back, no midline tenderness Extremities: no edema; symmetrical with no gross deformities Skin: warm and dry Neurologic: normal gait Psychological: alert and cooperative; normal mood and affect   ASSESSMENT & PLAN:  1. Generalized abdominal pain   2. Other specified hypotension   3. Tachycardia     Deferred further work-up to the ED Recommending further evaluation and management in the ED for chronic abdominal/ back pain, weight loss (apx 25 lbs in 3 weeks), hypotension in office, and tachycardia in office.  Patient, and patient's sister aware and in agreement with this plan.  Will go by private vehicle to [facility] ED.      Lestine Box, PA-C 01/16/19 1214

## 2019-01-16 NOTE — ED Provider Notes (Signed)
Pt presents with R mid abd ttp - has been ongoing for a couple of years - comes from UC after having c/o same with 25 lb weight loss - in last month per his rpoert - chronic alcoholic. Has mild ttp mid R abd - no guarding, no muprhy's signs. Pt agreeable to CT.  Medical screening examination/treatment/procedure(s) were conducted as a shared visit with non-physician practitioner(s) and myself.  I personally evaluated the patient during the encounter.  Clinical Impression:   Final diagnoses:  None         Noemi Chapel, MD 01/18/19 [phone]

## 2019-01-16 NOTE — ED Notes (Signed)
Patient became dizzy after standing for approximately 30 seconds.  Patient stated that "it did clear up but he had a brief episode of dizziness".

## 2019-01-17 DIAGNOSIS — N12 Tubulo-interstitial nephritis, not specified as acute or chronic: Secondary | ICD-10-CM

## 2019-01-17 LAB — CBC
HCT: 26.1 % — ABNORMAL LOW (ref 39.0–52.0)
Hemoglobin: 8.2 g/dL — ABNORMAL LOW (ref 13.0–17.0)
MCH: 29.6 pg (ref 26.0–34.0)
MCHC: 31.4 g/dL (ref 30.0–36.0)
MCV: 94.2 fL (ref 80.0–100.0)
Platelets: 224 10*3/uL (ref 150–400)
RBC: 2.77 MIL/uL — ABNORMAL LOW (ref 4.22–5.81)
RDW: 16.2 % — ABNORMAL HIGH (ref 11.5–15.5)
WBC: 6.7 10*3/uL (ref 4.0–10.5)
nRBC: 0 % (ref 0.0–0.2)

## 2019-01-17 LAB — BASIC METABOLIC PANEL
Anion gap: 6 (ref 5–15)
BUN: 15 mg/dL (ref 6–20)
CO2: 23 mmol/L (ref 22–32)
Calcium: 8.2 mg/dL — ABNORMAL LOW (ref 8.9–10.3)
Chloride: 101 mmol/L (ref 98–111)
Creatinine, Ser: 0.87 mg/dL (ref 0.61–1.24)
GFR calc Af Amer: >60 mL/min (ref >60)
GFR calc non Af Amer: >60 mL/min (ref >60)
Glucose, Bld: 122 mg/dL — ABNORMAL HIGH (ref 70–99)
Potassium: 3.7 mmol/L (ref 3.5–5.1)
Sodium: 130 mmol/L — ABNORMAL LOW (ref 135–145)

## 2019-01-17 LAB — HEPATITIS PANEL, ACUTE
HCV Ab: 0.3 s/co ratio (ref 0.0–0.9)
Hep A IgM: NEGATIVE
Hep B C IgM: NEGATIVE
Hepatitis B Surface Ag: NEGATIVE

## 2019-01-17 LAB — MAGNESIUM: Magnesium: 1.6 mg/dL — ABNORMAL LOW (ref 1.7–2.4)

## 2019-01-17 LAB — SARS CORONAVIRUS 2 (TAT 6-24 HRS): SARS Coronavirus 2: NEGATIVE

## 2019-01-17 LAB — PHOSPHORUS: Phosphorus: 2.3 mg/dL — ABNORMAL LOW (ref 2.5–4.6)

## 2019-01-17 MED ORDER — ACETAMINOPHEN 325 MG PO TABS
650.0000 mg | ORAL_TABLET | Freq: Four times a day (QID) | ORAL | Status: DC | PRN
  Administered 2019-01-18: 650 mg via ORAL
  Filled 2019-01-17: qty 2

## 2019-01-17 MED ORDER — LORAZEPAM 1 MG PO TABS
1.0000 mg | ORAL_TABLET | ORAL | Status: DC | PRN
  Administered 2019-01-18 (×2): 2 mg via ORAL
  Filled 2019-01-17 (×2): qty 2

## 2019-01-17 MED ORDER — SODIUM CHLORIDE 0.9 % IV SOLN
1.0000 g | INTRAVENOUS | Status: DC
  Administered 2019-01-17 – 2019-01-19 (×3): 1 g via INTRAVENOUS
  Filled 2019-01-17 (×3): qty 10

## 2019-01-17 MED ORDER — ACETAMINOPHEN 650 MG RE SUPP: 650.0000 mg | Freq: Four times a day (QID) | RECTAL | Status: DC | PRN

## 2019-01-17 MED ORDER — SODIUM CHLORIDE 0.9 % IV SOLN
INTRAVENOUS | Status: AC
  Administered 2019-01-17 (×2): via INTRAVENOUS

## 2019-01-17 MED ORDER — MORPHINE SULFATE (PF) 2 MG/ML IV SOLN
2.0000 mg | INTRAVENOUS | Status: DC | PRN
  Administered 2019-01-18: 2 mg via INTRAVENOUS
  Filled 2019-01-17: qty 1

## 2019-01-17 MED ORDER — FOLIC ACID 1 MG PO TABS
1.0000 mg | ORAL_TABLET | Freq: Every day | ORAL | Status: DC
  Administered 2019-01-17 – 2019-01-19 (×3): 1 mg via ORAL
  Filled 2019-01-17 (×3): qty 1

## 2019-01-17 MED ORDER — VITAMIN B-1 100 MG PO TABS
100.0000 mg | ORAL_TABLET | Freq: Every day | ORAL | Status: DC
  Administered 2019-01-17 – 2019-01-19 (×3): 100 mg via ORAL
  Filled 2019-01-17 (×3): qty 1

## 2019-01-17 MED ORDER — SODIUM CHLORIDE 0.9 % IV SOLN
INTRAVENOUS | Status: DC
  Administered 2019-01-17 – 2019-01-19 (×5): via INTRAVENOUS

## 2019-01-17 MED ORDER — ADULT MULTIVITAMIN W/MINERALS CH
1.0000 | ORAL_TABLET | Freq: Every day | ORAL | Status: DC
  Administered 2019-01-17 – 2019-01-19 (×3): 1 via ORAL
  Filled 2019-01-17 (×3): qty 1

## 2019-01-17 MED ORDER — ONDANSETRON HCL 4 MG/2ML IJ SOLN: 4.0000 mg | Freq: Four times a day (QID) | INTRAMUSCULAR | Status: DC | PRN

## 2019-01-17 MED ORDER — THIAMINE HCL 100 MG/ML IJ SOLN: 100.0000 mg | Freq: Every day | INTRAMUSCULAR | Status: DC

## 2019-01-17 MED ORDER — ONDANSETRON HCL 4 MG PO TABS: 4.0000 mg | ORAL_TABLET | Freq: Four times a day (QID) | ORAL | Status: DC | PRN

## 2019-01-17 MED ORDER — NICOTINE 7 MG/24HR TD PT24
7.0000 mg | MEDICATED_PATCH | Freq: Every day | TRANSDERMAL | Status: DC
  Administered 2019-01-17 – 2019-01-19 (×3): 7 mg via TRANSDERMAL
  Filled 2019-01-17 (×4): qty 1

## 2019-01-17 MED ORDER — DIAZEPAM 5 MG PO TABS
5.0000 mg | ORAL_TABLET | Freq: Once | ORAL | Status: AC
  Administered 2019-01-17: 5 mg via ORAL
  Filled 2019-01-17: qty 1

## 2019-01-17 MED ORDER — ENOXAPARIN SODIUM 40 MG/0.4ML ~~LOC~~ SOLN
40.0000 mg | SUBCUTANEOUS | Status: DC
  Administered 2019-01-17 – 2019-01-19 (×3): 40 mg via SUBCUTANEOUS
  Filled 2019-01-17 (×3): qty 0.4

## 2019-01-17 MED ORDER — ENSURE ENLIVE PO LIQD
237.0000 mL | Freq: Two times a day (BID) | ORAL | Status: DC
  Administered 2019-01-18 – 2019-01-19 (×3): 237 mL via ORAL

## 2019-01-17 MED ORDER — LORAZEPAM 2 MG/ML IJ SOLN: 1.0000 mg | INTRAMUSCULAR | Status: DC | PRN

## 2019-01-17 MED ORDER — POLYETHYLENE GLYCOL 3350 17 G PO PACK: 17.0000 g | PACK | Freq: Every day | ORAL | Status: DC | PRN

## 2019-01-17 NOTE — Progress Notes (Addendum)
Patient Demographics:    Tanner Thomas, is a 63 y.o. male, DOB - [birth year], [id number]  Admit date - 01/16/2019   Admitting Physician Ejiroghene Arlyce Dice, MD  Outpatient Primary MD for the patient is Patient, No Pcp Per  LOS - 1   Chief Complaint  Patient presents with   Abdominal Pain        Subjective:    Tanner Thomas today has no fevers, no emesis,  No chest pain, right flank pain persist,  Assessment  & Plan :    Active Problems:   Pyelonephritis   1) right-sided pyelonephritis----continue IV Rocephin pending urine cultures, WBC down to 6.7 from 11  2) hyponatremia--- suspect due to beer potomania, sodium is up to 130 from 128,, continue IV normal saline  3) acute on chronic anemia, hemoglobin down to 8.2 from 10.1 on admission, patient with history of alcohol abuse, continue Protonix, stool occult blood pending  4) severe protein caloric malnutrition--the setting of alcohol and tobacco abuse with reduced oral intake, nutritional supplement as advised, dietitian consult and input appreciated  5)ETOH Abuse--no frank DTs at this time, continue lorazepam per CIWA protocol, continue thiamine and folic acid  Disposition/Need for in-Hospital Stay- patient unable to be discharged at this time due to -hyponatremia and right-sided pyelonephritis requiring IV fluids and IV antibiotics respectively  Code Status : Full  Family Communication:   NA (patient is alert, awake and coherent)  Disposition Plan  : home  Consults  :  na  DVT Prophylaxis  :  Lovenox -  SCDs   Lab Results  Component Value Date   PLT 224 01/17/2019    Inpatient Medications  Scheduled Meds:  enoxaparin (LOVENOX) injection  40 mg Subcutaneous Q24H   feeding supplement (ENSURE ENLIVE)  237 mL Oral BID BM   folic acid  1 mg Oral Daily   multivitamin with minerals  1 tablet Oral Daily   nicotine  7 mg  Transdermal Daily   thiamine  100 mg Oral Daily   Or   thiamine  100 mg Intravenous Daily   Continuous Infusions:  cefTRIAXone (ROCEPHIN)  IV Stopped (01/17/19 0222)   PRN Meds:.acetaminophen **OR** acetaminophen, LORazepam **OR** LORazepam, morphine injection, ondansetron **OR** ondansetron (ZOFRAN) IV, polyethylene glycol   Anti-infectives (From admission, onward)   Start     Dose/Rate Route Frequency Ordered Stop   01/17/19 0108  cefTRIAXone (ROCEPHIN) 1 g in sodium chloride 0.9 % 100 mL IVPB     1 g 200 mL/hr over 30 Minutes Intravenous Every 24 hours 01/17/19 0108     01/16/19 1900  cefTRIAXone (ROCEPHIN) 1 g in sodium chloride 0.9 % 100 mL IVPB     1 g 200 mL/hr over 30 Minutes Intravenous  Once 01/16/19 1855 01/16/19 2037        Objective:   Vitals:   01/17/19 0100 01/17/19 0130 01/17/19 0652 01/17/19 1247  BP: 117/66 112/67 111/61 98/62  Pulse: 65 70 61 73  Resp: 18 19 20 18   Temp:   99.1 F (37.3 C) 97.9 F (36.6 C)  TempSrc:   Oral   SpO2: 100% 100% 100% 100%  Weight:   57.5 kg   Height:   5\' 10"  (1.778 m)  Wt Readings from Last 3 Encounters:  01/17/19 57.5 kg  12/23/18 68 kg     Intake/Output Summary (Last 24 hours) at 01/17/2019 1754 Last data filed at 01/17/2019 1300 Gross per 24 hour  Intake 1480 ml  Output --  Net 1480 ml   Physical Exam  Gen:- Awake Alert,   HEENT:- [place].AT, No sclera icterus Neck-Supple Neck,No JVD,.  Lungs-  CTAB , fair symmetrical air movement CV- S1, S2 normal, regular  Abd-  +ve B.Sounds, Abd Soft, Rt flank/CVA area tenderness, prior laparotomy scar noted Extremity/Skin:- No  edema, pedal pulses present  Psych-affect is appropriate, oriented x3 Neuro-no new focal deficits, no tremors   Data Review:   Micro Results Recent Results (from the past 240 hour(s))  SARS CORONAVIRUS 2 (TAT 6-24 HRS) Nasopharyngeal Nasopharyngeal Swab     Status: None   Collection Time: 01/16/19  7:28 PM   Specimen: Nasopharyngeal Swab   Result Value Ref Range Status   SARS Coronavirus 2 NEGATIVE NEGATIVE Final    Comment: (NOTE) SARS-CoV-2 target nucleic acids are NOT DETECTED. The SARS-CoV-2 RNA is generally detectable in upper and lower respiratory specimens during the acute phase of infection. Negative results do not preclude SARS-CoV-2 infection, do not rule out co-infections with other pathogens, and should not be used as the sole basis for treatment or other patient management decisions. Negative results must be combined with clinical observations, patient history, and epidemiological information. The expected result is Negative. Fact Sheet for Patients: SugarRoll.be Fact Sheet for Healthcare Providers: https://www.woods-mathews.com/ This test is not yet approved or cleared by the [place] FDA and  has been authorized for detection and/or diagnosis of SARS-CoV-2 by FDA under an Emergency Use Authorization (EUA). This EUA will remain  in effect (meaning this test can be used) for the duration of the COVID-19 declaration under Section 56 4(b)(1) of the Act, 21 U.S.C. section 360bbb-3(b)(1), unless the authorization is terminated or revoked sooner. Performed at [facility], [place] [address] [place], [place] [address]     Radiology Reports Dg Lumbar Spine Complete  Result Date: 12/23/2018 CLINICAL DATA:  Low back pain and right leg pain EXAM: LUMBAR SPINE - COMPLETE 4+ VIEW COMPARISON:  None. FINDINGS: Six non rib-bearing lumbar type vertebral segments. Vertebral body heights are maintained. No fracture identified. Relative preservation of the intervertebral disc spaces. Mild multilevel endplate spurring. Minimal lower lumbar facet arthrosis. IMPRESSION: 1. No acute fracture or static subluxation of the lumbar spine. 2. Mild degenerative changes. Electronically Signed   By: Davina Poke M.D.   On: 12/23/2018 12:30   Ct Abdomen Pelvis W Contrast  Result  Date: 01/16/2019 CLINICAL DATA:  Progressive right-sided abdominal pain. Unintentional weight loss over past 2 months. EXAM: CT ABDOMEN AND PELVIS WITH CONTRAST TECHNIQUE: Multidetector CT imaging of the abdomen and pelvis was performed using the standard protocol following bolus administration of intravenous contrast. CONTRAST:  139mL OMNIPAQUE IOHEXOL 300 MG/ML  SOLN COMPARISON:  None. FINDINGS: Lower Chest: No acute findings. Hepatobiliary: No hepatic masses identified. Gallbladder is unremarkable. Diffuse biliary ductal dilatation is seen to the level of the pancreatic head, with common bile duct measuring approximately 13 mm in diameter. Pancreas: Diffuse pancreatic calcifications are seen, consistent with chronic pancreatitis. No pancreatic mass identified. No evidence of acute peripancreatic inflammatory changes. A new rim enhancing fluid collection is seen adjacent to the pancreatic tail which measures 2.2 x 1.2 cm, consistent with a small pancreatic pseudocyst. Spleen: Within normal limits in size and appearance. Adrenals/Urinary  Tract: Multiple ill-defined areas of decreased parenchymal enhancement are seen in the right kidney with mild perinephric stranding. These findings are consistent with pyelonephritis. No evidence of renal mass or abscess. No evidence of ureteral calculi or hydronephrosis. Normal appearance of left kidney. Mild diffuse bladder wall thickening, suspicious for cystitis. Stomach/Bowel: No evidence of obstruction, inflammatory process or abnormal fluid collections. Vascular/Lymphatic: No pathologically enlarged lymph nodes. No abdominal aortic aneurysm. Aortic atherosclerosis. 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 Reproductive:  No mass or other significant abnormality. Other:  None. Musculoskeletal:  No suspicious bone lesions identified. IMPRESSION: Acute right pyelonephritis. No evidence of renal  abscess or hydronephrosis. Mild diffuse bladder wall thickening, suspicious for cystitis. Diffuse biliary ductal dilatation, with distal common bile duct obstruction which appears to be due to chronic calcific pancreatitis. No radiographic evidence of pancreatic mass or acute pancreatitis. Small pancreatic pseudocyst adjacent to pancreatic tail. Chronic splenic vein thrombosis, with portosystemic collaterals in gastrohepatic and gastrosplenic ligaments. Electronically Signed   By: Marlaine Hind M.D.   On: 01/16/2019 17:00   Dg Knee Complete 4 Views Right  Result Date: 12/23/2018 CLINICAL DATA:  Pain EXAM: RIGHT KNEE - COMPLETE 4+ VIEW COMPARISON:  None. FINDINGS: No fracture or dislocation of the right knee. 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 Moderate, nonspecific knee joint effusion. Heterotopic ossification about the lateral distal femur. IMPRESSION: No fracture or dislocation of the right knee. 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 Moderate, nonspecific knee joint effusion. Heterotopic ossification about the lateral distal femur, consistent with prior trauma. Electronically Signed   By: Eddie Candle M.D.   On: 12/23/2018 12:29     CBC Recent Labs  Lab 01/16/19 1324 01/17/19 0625  WBC 11.1* 6.7  HGB 10.1* 8.2*  HCT 31.9* 26.1*  PLT 274 224  MCV 94.1 94.2  MCH 29.8 29.6  MCHC 31.7 31.4  RDW 16.7* 16.2*    Chemistries  Recent Labs  Lab 01/16/19 1324 01/16/19 2326 01/17/19 0625  NA 128*  --  130*  K 4.0  --  3.7  CL 94*  --  101  CO2 23  --  23  GLUCOSE 135*  --  122*  BUN 16  --  15  CREATININE 1.18  --  0.87  CALCIUM 8.9  --  8.2*  MG  --  1.6*  --   AST 32  --   --   ALT 20  --   --   ALKPHOS 60  --   --   BILITOT 1.1  --   --     ------------------------------------------------------------------------------------------------------------------ No results for input(s): CHOL, HDL, LDLCALC, TRIG, CHOLHDL, LDLDIRECT in the last 72 hours.  No results found for: HGBA1C ------------------------------------------------------------------------------------------------------------------ No results for input(s): TSH, T4TOTAL, T3FREE, THYROIDAB in the last 72 hours.  Invalid input(s): FREET3 ------------------------------------------------------------------------------------------------------------------ No results for input(s): VITAMINB12, FOLATE, FERRITIN, TIBC, IRON, RETICCTPCT in the last 72 hours.  Coagulation profile No results for input(s): INR, PROTIME in the last 168 hours.  No results for input(s): DDIMER in the last 72 hours.  Cardiac Enzymes No results for input(s): CKMB, TROPONINI, MYOGLOBIN in the last 168 hours.  Invalid input(s): CK ------------------------------------------------------------------------------------------------------------------ No results found for: BNP   Roxan Hockey M.D on 01/17/2019 at 5:54 PM  Go to [email or web address] - for contact info  Component Value Date/Time   BNP 1,397.7 (H) 01/09/2019 1302   BNP 45.9 09/01/2015 1629   Bemnet Trovato M.D on 01/10/2019 at 4:00 PM  Go to [email or web address] - for contact info  [facility] - Office  [phone]

## 2019-01-17 NOTE — Progress Notes (Addendum)
Initial Nutrition Assessment  DOCUMENTATION CODES:   Severe malnutrition in context of chronic illness   Underweight  INTERVENTION:  Ensure Enlive po BID, each supplement provides 350 kcal and 20 grams of protein    NUTRITION DIAGNOSIS:   Severe Malnutrition related to chronic illness(ETOH abuse) as evidenced by 15%- percent weight loss, moderate fat depletion, severe muscle depletion.   GOAL:   Patient will meet greater than or equal to 90% of their needs  MONITOR:   PO intake, Supplement acceptance, Weight trends  REASON FOR ASSESSMENT:   Malnutrition Screening Tool    ASSESSMENT:  Patient is an underweight 63 yo male with hx of ETOH abuse, tobacco smoker. He presents with complaint of abdominal pain, unplanned weight loss. Hypomagnesium and hypophosphatemia.     Meal intake: 75% -feeds himself. Endorses good appetite and according to diet history eats well. Breakfast: eggs, bacon, toast coffee. Lunch: 1-2 sandwiches and a soft drink or water. Dinner: chicken, vegetables and cornbread. Question if pt has been eating as well as stated given the severity of his wt loss.   Patient currently weighs 126.5 lb (57.5 kg). A significant loss of ~ 25 lbs (15%) reported- 68 kg 8/31. Patient reports loss as starting a few months ago when he lived in [place] . He has been in the area around 1 month. Lives with his sister.  Medications reviewed and include: Thiamine, Folic acid, MVI, Rocephin.    Labs: Mag.  BMP Latest Ref Rng & Units 01/17/2019 01/16/2019  Glucose 70 - 99 mg/dL 122(H) 135(H)  BUN 6 - 20 mg/dL 15 16  Creatinine 0.61 - 1.24 mg/dL 0.87 1.18  Sodium 135 - 145 mmol/L 130(L) 128(L)  Potassium 3.5 - 5.1 mmol/L 3.7 4.0  Chloride 98 - 111 mmol/L 101 94(L)  CO2 22 - 32 mmol/L 23 23  Calcium 8.9 - 10.3 mg/dL 8.2(L) 8.9     NUTRITION - FOCUSED PHYSICAL EXAM: Nutrition-Focused physical exam findings are moderate fat depletion, severe muscle depletion, and no edema.      Diet Order:   Diet Order            Diet regular Room service appropriate? Yes; Fluid consistency: Thin  Diet effective now              EDUCATION NEEDS:  No education needs have been identified at this time   Skin:  Skin Assessment: Reviewed RN Assessment  Last BM:  unknown  Height:   Ht Readings from Last 1 Encounters:  01/17/19 5\' 10"  (1.778 m)    Weight:   Wt Readings from Last 1 Encounters:  01/17/19 57.5 kg    Ideal Body Weight:  75 kg  BMI:  Body mass index is 18.19 kg/m.  Estimated Nutritional Needs:   Kcal:  1740-2030 (30-35 kcal/kg/bw)  Protein:  81-93 (1.4-1.6 gr/kg/bw)  Fluid:  1.7-2.0 liters daily  Colman Cater MS,RD,CSG,LDN Office: [phone] Pager: [phone]

## 2019-01-17 NOTE — Plan of Care (Signed)

## 2019-01-18 LAB — HIV ANTIBODY (ROUTINE TESTING W REFLEX): HIV Screen 4th Generation wRfx: NONREACTIVE

## 2019-01-18 MED ORDER — K PHOS MONO-SOD PHOS DI & MONO 155-852-130 MG PO TABS
250.0000 mg | ORAL_TABLET | Freq: Three times a day (TID) | ORAL | Status: DC
  Administered 2019-01-18 – 2019-01-19 (×4): 250 mg via ORAL
  Filled 2019-01-18 (×4): qty 1

## 2019-01-18 NOTE — Progress Notes (Signed)
Tanner Thomas Demographics:    Tanner Thomas, is a 63 y.o. male, DOB - 30-Apr-1955, [id number]  Admit date - 01/16/2019   Admitting Physician Ejiroghene Arlyce Dice, MD  Outpatient Primary MD for the Tanner Thomas is Tanner Thomas, No Pcp Per  LOS - 2   Chief Complaint  Tanner Thomas presents with   Abdominal Pain        Subjective:    [name] today has no fevers, no emesis,  No chest pain, right flank pain persist,   Assessment  & Plan :    Active Problems:   Pyelonephritis   1) right-sided E coli pyelonephritis----continue IV Rocephin pending urine cultures, WBC down to 6.7 from 11  2) hyponatremia--- suspect due to beer potomania, sodium is up to 130 from 128,, continue IV normal saline  3)Hypotension--- query dehydration and volume depletion  4) severe protein caloric malnutrition--the setting of alcohol and tobacco abuse with reduced oral intake, nutritional supplement as advised, dietitian consult and input appreciated  5)ETOH Abuse--no frank DTs at this time, continue lorazepam per CIWA protocol, continue thiamine and folic acid  6) acute on chronic anemia, hemoglobin down to 8.2 from 10.1 on admission, Tanner Thomas with history of alcohol abuse, continue Protonix, stool occult blood pending  Disposition/Need for in-Hospital Stay- Tanner Thomas unable to be discharged at this time due to -hyponatremia and right-sided pyelonephritis requiring IV fluids and IV antibiotics pending sensitivities of E. coli  Code Status : Full  Family Communication:   NA (Tanner Thomas is alert, awake and coherent)  Disposition Plan  : home  Consults  :  na  DVT Prophylaxis  :  Lovenox -  SCDs   Lab Results  Component Value Date   PLT 224 01/17/2019    Inpatient Medications  Scheduled Meds:  enoxaparin (LOVENOX) injection  40 mg Subcutaneous Q24H   feeding supplement (ENSURE ENLIVE)  237 mL Oral BID BM   folic acid  1 mg  Oral Daily   multivitamin with minerals  1 tablet Oral Daily   nicotine  7 mg Transdermal Daily   phosphorus  250 mg Oral TID   thiamine  100 mg Oral Daily   Or   thiamine  100 mg Intravenous Daily   Continuous Infusions:  sodium chloride 75 mL/hr at 01/18/19 0354   cefTRIAXone (ROCEPHIN)  IV 1 g (01/18/19 0042)   PRN Meds:.acetaminophen **OR** acetaminophen, LORazepam **OR** LORazepam, morphine injection, ondansetron **OR** ondansetron (ZOFRAN) IV, polyethylene glycol   Anti-infectives (From admission, onward)   Start     Dose/Rate Route Frequency Ordered Stop   01/17/19 0108  cefTRIAXone (ROCEPHIN) 1 g in sodium chloride 0.9 % 100 mL IVPB     1 g 200 mL/hr over 30 Minutes Intravenous Every 24 hours 01/17/19 0108     01/16/19 1900  cefTRIAXone (ROCEPHIN) 1 g in sodium chloride 0.9 % 100 mL IVPB     1 g 200 mL/hr over 30 Minutes Intravenous  Once 01/16/19 1855 01/16/19 2037        Objective:   Vitals:   01/17/19 0652 01/17/19 1247 01/18/19 0030 01/18/19 0612  BP: 111/61 98/62 91/63  (!) 87/62  Pulse: 61 73 78 67  Resp: 20 18 16 14   Temp: 99.1 F (37.3 C) 97.9 F (36.6 C)  98.5 F (36.9 C) 97.8 F (36.6 C)  TempSrc: Oral  Oral Oral  SpO2: 100% 100% 100% 100%  Weight: 57.5 kg     Height: 5\' 10"  (1.778 m)       Wt Readings from Last 3 Encounters:  01/17/19 57.5 kg  12/23/18 68 kg     Intake/Output Summary (Last 24 hours) at 01/18/2019 0914 Last data filed at 01/18/2019 [id number] Gross per 24 hour  Intake 3260.93 ml  Output 1000 ml  Net 2260.93 ml   Physical Exam  Gen:- Awake Alert,   HEENT:- [place].AT, No sclera icterus Neck-Supple Neck,No JVD,.  Lungs-  CTAB , fair symmetrical air movement CV- S1, S2 normal, regular  Abd-  +ve B.Sounds, Abd Soft, improving rt flank/CVA area tenderness, prior laparotomy scar noted Extremity/Skin:- No  edema, pedal pulses present  Psych-affect is appropriate, oriented x3 Neuro-no new focal deficits, no tremors   Data Review:    Micro Results Recent Results (from the past 240 hour(s))  Urine Culture     Status: Abnormal (Preliminary result)   Collection Time: 01/16/19  6:55 PM   Specimen: Urine, Clean Catch  Result Value Ref Range Status   Specimen Description   Final    URINE, CLEAN CATCH Performed at [facility], [address], [place], [place] [address]    Special Requests   Final    NONE Performed at [facility], [address] [place], [place] [address]    Culture >=100,000 COLONIES/mL GRAM NEGATIVE RODS (A)  Final   Report Status PENDING  Incomplete  SARS CORONAVIRUS 2 (TAT 6-24 HRS) Nasopharyngeal Nasopharyngeal Swab     Status: None   Collection Time: 01/16/19  7:28 PM   Specimen: Nasopharyngeal Swab  Result Value Ref Range Status   SARS Coronavirus 2 NEGATIVE NEGATIVE Final    Comment: (NOTE) SARS-CoV-2 target nucleic acids are NOT DETECTED. The SARS-CoV-2 RNA is generally detectable in upper and lower respiratory specimens during the acute phase of infection. Negative results do not preclude SARS-CoV-2 infection, do not rule out co-infections with other pathogens, and should not be used as the sole basis for treatment or other Tanner Thomas management decisions. Negative results must be combined with clinical observations, Tanner Thomas history, and epidemiological information. The expected result is Negative. Fact Sheet for Patients: SugarRoll.be Fact Sheet for Healthcare Providers: https://www.woods-mathews.com/ This test is not yet approved or cleared by the [place] FDA and  has been authorized for detection and/or diagnosis of SARS-CoV-2 by FDA under an Emergency Use Authorization (EUA). This EUA will remain  in effect (meaning this test can be used) for the duration of the COVID-19 declaration under Section 56 4(b)(1) of the Act, 21 U.S.C. section 360bbb-3(b)(1), unless the authorization is terminated or revoked sooner. Performed at [facility], [place] [address], [place], [place] [address]     Radiology Reports Dg Lumbar Spine Complete  Result Date: 12/23/2018 CLINICAL DATA:  Low back pain and right leg pain EXAM: LUMBAR SPINE - COMPLETE 4+ VIEW COMPARISON:  None. FINDINGS: Six non rib-bearing lumbar type vertebral segments. Vertebral body heights are maintained. No fracture identified. Relative preservation of the intervertebral disc spaces. Mild multilevel endplate spurring. Minimal lower lumbar facet arthrosis. IMPRESSION: 1. No acute fracture or static subluxation of the lumbar spine. 2. Mild degenerative changes. Electronically Signed   By: Davina Poke M.D.   On: 12/23/2018 12:30   Ct Abdomen Pelvis W Contrast  Result Date: 01/16/2019 CLINICAL DATA:  Progressive right-sided abdominal pain. Unintentional  weight loss over past 2 months. EXAM: CT ABDOMEN AND PELVIS WITH CONTRAST TECHNIQUE: Multidetector CT imaging of the abdomen and pelvis was performed using the standard protocol following bolus administration of intravenous contrast. CONTRAST:  16mL OMNIPAQUE IOHEXOL 300 MG/ML  SOLN COMPARISON:  None. FINDINGS: Lower Chest: No acute findings. Hepatobiliary: No hepatic masses identified. Gallbladder is unremarkable. Diffuse biliary ductal dilatation is seen to the level of the pancreatic head, with common bile duct measuring approximately 13 mm in diameter. Pancreas: Diffuse pancreatic calcifications are seen, consistent with chronic pancreatitis. No pancreatic mass identified. No evidence of acute peripancreatic inflammatory changes. A new rim enhancing fluid collection is seen adjacent to the pancreatic tail which measures 2.2 x 1.2 cm, consistent with a small pancreatic pseudocyst. Spleen: Within normal limits in size and appearance. Adrenals/Urinary Tract: Multiple ill-defined areas of decreased parenchymal enhancement are seen in the right kidney with mild perinephric stranding. These findings are consistent with  pyelonephritis. No evidence of renal mass or abscess. No evidence of ureteral calculi or hydronephrosis. Normal appearance of left kidney. Mild diffuse bladder wall thickening, suspicious for cystitis. Stomach/Bowel: No evidence of obstruction, inflammatory process or abnormal fluid collections. Vascular/Lymphatic: No pathologically enlarged lymph nodes. No abdominal aortic aneurysm. Aortic atherosclerosis. 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 Reproductive:  No mass or other significant abnormality. Other:  None. Musculoskeletal:  No suspicious bone lesions identified. IMPRESSION: Acute right pyelonephritis. No evidence of renal abscess or hydronephrosis. Mild diffuse bladder wall thickening, suspicious for cystitis. Diffuse biliary ductal dilatation, with distal common bile duct obstruction which appears to be due to chronic calcific pancreatitis. No radiographic evidence of pancreatic mass or acute pancreatitis. Small pancreatic pseudocyst adjacent to pancreatic tail. Chronic splenic vein thrombosis, with portosystemic collaterals in gastrohepatic and gastrosplenic ligaments. Electronically Signed   By: Marlaine Hind M.D.   On: 01/16/2019 17:00   Dg Knee Complete 4 Views Right  Result Date: 12/23/2018 CLINICAL DATA:  Pain EXAM: RIGHT KNEE - COMPLETE 4+ VIEW COMPARISON:  None. FINDINGS: No fracture or dislocation of the right knee. 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 Moderate, nonspecific knee joint effusion. Heterotopic ossification about the lateral distal femur. IMPRESSION: No fracture or dislocation of the right knee. 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 Moderate, nonspecific knee joint effusion. Heterotopic ossification about  the lateral distal femur, consistent with prior trauma. Electronically Signed   By: Eddie Candle M.D.   On: 12/23/2018 12:29     CBC Recent Labs  Lab 01/16/19 1324 01/17/19 0625  WBC 11.1* 6.7  HGB 10.1* 8.2*  HCT 31.9* 26.1*  PLT 274 224  MCV 94.1 94.2  MCH 29.8 29.6  MCHC 31.7 31.4  RDW 16.7* 16.2*    Chemistries  Recent Labs  Lab 01/16/19 1324 01/16/19 2326 01/17/19 0625  NA 128*  --  130*  K 4.0  --  3.7  CL 94*  --  101  CO2 23  --  23  GLUCOSE 135*  --  122*  BUN 16  --  15  CREATININE 1.18  --  0.87  CALCIUM 8.9  --  8.2*  MG  --  1.6*  --   AST 32  --   --   ALT 20  --   --   ALKPHOS 60  --   --  BILITOT 1.1  --   --    ------------------------------------------------------------------------------------------------------------------ No results for input(s): CHOL, HDL, LDLCALC, TRIG, CHOLHDL, LDLDIRECT in the last 72 hours.  No results found for: HGBA1C ------------------------------------------------------------------------------------------------------------------ No results for input(s): TSH, T4TOTAL, T3FREE, THYROIDAB in the last 72 hours.  Invalid input(s): FREET3 ------------------------------------------------------------------------------------------------------------------ No results for input(s): VITAMINB12, FOLATE, FERRITIN, TIBC, IRON, RETICCTPCT in the last 72 hours.  Coagulation profile No results for input(s): INR, PROTIME in the last 168 hours.  No results for input(s): DDIMER in the last 72 hours.  Cardiac Enzymes No results for input(s): CKMB, TROPONINI, MYOGLOBIN in the last 168 hours.  Invalid input(s): CK ------------------------------------------------------------------------------------------------------------------ No results found for: BNP   Roxan Hockey M.D on 01/18/2019 at 9:14 AM  Go to [email or web address] - for contact info  [facility] - Office  [phone]

## 2019-01-19 ENCOUNTER — Inpatient Hospital Stay (HOSPITAL_COMMUNITY): Payer: Self-pay

## 2019-01-19 LAB — COMPREHENSIVE METABOLIC PANEL
ALT: 28 U/L (ref 0–44)
AST: 38 U/L (ref 15–41)
Albumin: 2.5 g/dL — ABNORMAL LOW (ref 3.5–5.0)
Alkaline Phosphatase: 115 U/L (ref 38–126)
Anion gap: 7 (ref 5–15)
BUN: 10 mg/dL (ref 6–20)
CO2: 23 mmol/L (ref 22–32)
Calcium: 8.1 mg/dL — ABNORMAL LOW (ref 8.9–10.3)
Chloride: 102 mmol/L (ref 98–111)
Creatinine, Ser: 0.77 mg/dL (ref 0.61–1.24)
GFR calc Af Amer: >60 mL/min (ref >60)
GFR calc non Af Amer: >60 mL/min (ref >60)
Glucose, Bld: 118 mg/dL — ABNORMAL HIGH (ref 70–99)
Potassium: 3.4 mmol/L — ABNORMAL LOW (ref 3.5–5.1)
Sodium: 132 mmol/L — ABNORMAL LOW (ref 135–145)
Total Bilirubin: 0.3 mg/dL (ref 0.3–1.2)
Total Protein: 6.6 g/dL (ref 6.5–8.1)

## 2019-01-19 LAB — MAGNESIUM: Magnesium: 1.3 mg/dL — ABNORMAL LOW (ref 1.7–2.4)

## 2019-01-19 LAB — CBC
HCT: 26.4 % — ABNORMAL LOW (ref 39.0–52.0)
Hemoglobin: 8.2 g/dL — ABNORMAL LOW (ref 13.0–17.0)
MCH: 28.9 pg (ref 26.0–34.0)
MCHC: 31.1 g/dL (ref 30.0–36.0)
MCV: 93 fL (ref 80.0–100.0)
Platelets: 202 10*3/uL (ref 150–400)
RBC: 2.84 MIL/uL — ABNORMAL LOW (ref 4.22–5.81)
RDW: 16.3 % — ABNORMAL HIGH (ref 11.5–15.5)
WBC: 4.4 10*3/uL (ref 4.0–10.5)
nRBC: 0 % (ref 0.0–0.2)

## 2019-01-19 LAB — URINE CULTURE: Culture: >=100000 — AB

## 2019-01-19 MED ORDER — POTASSIUM CHLORIDE CRYS ER 20 MEQ PO TBCR
40.0000 meq | EXTENDED_RELEASE_TABLET | ORAL | Status: AC
  Administered 2019-01-19: 40 meq via ORAL
  Filled 2019-01-19: qty 2

## 2019-01-19 MED ORDER — MAGNESIUM SULFATE 2 GM/50ML IV SOLN
2.0000 g | Freq: Once | INTRAVENOUS | Status: AC
  Administered 2019-01-19: 2 g via INTRAVENOUS

## 2019-01-19 MED ORDER — ACETAMINOPHEN 325 MG PO TABS: 650.0000 mg | ORAL_TABLET | Freq: Four times a day (QID) | ORAL | 1 refills | Status: DC | PRN

## 2019-01-19 MED ORDER — FOLIC ACID 1 MG PO TABS: 1.0000 mg | ORAL_TABLET | Freq: Every day | ORAL | 3 refills | Status: DC

## 2019-01-19 MED ORDER — AMOXICILLIN 500 MG PO CAPS: 500.0000 mg | ORAL_CAPSULE | Freq: Three times a day (TID) | ORAL | 0 refills | Status: AC

## 2019-01-19 MED ORDER — THIAMINE HCL 100 MG PO TABS: 100.0000 mg | ORAL_TABLET | Freq: Every day | ORAL | 2 refills | Status: DC

## 2019-01-19 NOTE — Plan of Care (Signed)
  Problem: Education: Goal: Knowledge of General Education information will improve Description: Including pain rating scale, medication(s)/side effects and non-pharmacologic comfort measures 01/19/2019 1254 by Santa Lighter, RN Outcome: Adequate for Discharge 01/19/2019 1254 by Santa Lighter, RN Outcome: Progressing   Problem: Clinical Measurements: Goal: Ability to maintain clinical measurements within normal limits will improve Outcome: Adequate for Discharge Goal: Will remain free from infection Outcome: Adequate for Discharge Goal: Diagnostic test results will improve Outcome: Adequate for Discharge Goal: Respiratory complications will improve Outcome: Adequate for Discharge Goal: Cardiovascular complication will be avoided Outcome: Adequate for Discharge   Problem: Activity: Goal: Risk for activity intolerance will decrease Outcome: Adequate for Discharge   Problem: Nutrition: Goal: Adequate nutrition will be maintained Outcome: Adequate for Discharge   Problem: Coping: Goal: Level of anxiety will decrease Outcome: Adequate for Discharge   Problem: Elimination: Goal: Will not experience complications related to bowel motility Outcome: Adequate for Discharge Goal: Will not experience complications related to urinary retention Outcome: Adequate for Discharge   Problem: Pain Managment: Goal: General experience of comfort will improve Outcome: Adequate for Discharge   Problem: Safety: Goal: Ability to remain free from injury will improve Outcome: Adequate for Discharge   Problem: Skin Integrity: Goal: Risk for impaired skin integrity will decrease Outcome: Adequate for Discharge

## 2019-01-19 NOTE — Plan of Care (Signed)
  Problem: Education: Goal: Knowledge of General Education information will improve Description Including pain rating scale, medication(s)/side effects and non-pharmacologic comfort measures Outcome: Progressing   Problem: Health Behavior/Discharge Planning: Goal: Ability to manage health-related needs will improve Outcome: Progressing   

## 2019-01-19 NOTE — Progress Notes (Signed)
Removed IV-clean, dry, intact. Reviewed  D/c paperwork with patient. Answered all questions and discussed new medications. Walked stable patient and belongings to main entrance where I thought his sister would be there to pick him up but patient said he had forgotten to call her. I called her and she said she would come pick him up. She agreed that I could leave him sitting in the lobby.

## 2019-01-19 NOTE — Discharge Instructions (Signed)
1) you were treated for E. coli infection in your urine your kidney--- please take and complete amoxicillin antibiotic as prescribed 2) follow-up to primary care physician within a week for recheck and reevaluation 3) abstinence from tobacco and alcohol advised 4)Follow-up with gastroenterologist Dr. Laural Golden ([address] , [address], --[phone])  for endoluminal evaluation to rule out underlying malignancy given tobacco and alcohol abuse and concerns about possible weight loss

## 2019-01-19 NOTE — Discharge Summary (Signed)
Tanner Thomas, is a 63 y.o. male  DOB 1955-05-26  MRN [id number].  Admission date:  01/16/2019  Admitting Physician  Bethena Roys, MD  Discharge Date:  01/19/2019   Primary MD  Patient, No Pcp Per  Recommendations for primary care physician for things to follow:   - 1)You were treated for E. coli infection in your urine your kidney--- please take and complete amoxicillin antibiotic as prescribed 2) follow-up to primary care physician within a week for recheck and reevaluation 3) abstinence from tobacco and alcohol advised 4)Follow-up with gastroenterologist Dr. Laural Golden ([address] , [address], --[phone])  for endoluminal evaluation to rule out underlying malignancy given tobacco and alcohol abuse and concerns about possible weight loss  Admission Diagnosis  Hyponatremia [E87.1] Pyelonephritis [N12] Orthostasis [I95.1]   Discharge Diagnosis  Hyponatremia [E87.1] Pyelonephritis [N12] Orthostasis [I95.1]    Principal Problem:   E coli Pyelonephritis      History reviewed. No pertinent past medical history.  Past Surgical History:  Procedure Laterality Date  . PANCREAS SURGERY         HPI  from the history and physical done on the day of admission:   -  Patient coming from: Home  I have personally briefly reviewed patient's old medical records in [place]  Chief Complaint: Abdominal pain, right flank pain  HPI: Tanner Thomas is a 63 y.o. male with medical history significant for alcohol abuse.  Presented to the ED with complaints of chronic intermittent lower abdominal pain over the past 1 to 2 years.  He tells me it is on the left lower side, but prior documentation states right lower side.  Over the past few days he has had new right flank pain.  No fever no chills.  No vomiting.  No loose stools.  No pain with urination. Patient also reports 25  pound weight loss over the past months.  Reports good p.o. intake.  He has never had a colonoscopy.  Family history of cancers.  Smokes half pack cigarettes daily.  No cough or difficulty breathing or chest pain. Patient moved from [place] to [place] about a month ago to be closer to family.  Currently lives with his sister.  Patient has not been receiving regular medical care.  He has hardly been to a hospital clinic except for when he had surgery on his pancreas about 5 years ago.  He is unsure what type of procedure was done or why he needed it.  ED Course: Temperature 98.  Blood pressure systolic 99991111.  WBC 11.1.  UA > 50 WBCs few bacteria.  UDS positive for marijuana. Sodium 128.  Blood alcohol level unremarkable.  Pelvis with contrast showed acute right pyelonephritis.  No abscess or hydronephrosis.  Also suggestive of cystitis.  Diffuse biliary ductal dilatation with distal common bile duct obstruction which appears to be due to chronic calcific pancreatitis.  In the ED he was weak and reported dizziness standing.  IV ceftriaxone started.  Hospitalist to admit for  acute pyelonephritis.      Hospital Course:     -1) Right-sided E coli Pyelonephritis----treated with  IV Rocephin  , WBC down to 4.4 from 11 -No fever , right flank pain resolved -We will discharge home on p.o. amoxicillin  2) hyponatremia--- suspect due to beer potomania, sodium is up to 132 from 128,, treated with IV normal saline -Excessive beer intake discouraged  3)Hypotension--- query dehydration and volume depletion  4) severe protein caloric malnutrition/weight loss concerns--the setting of alcohol and tobacco abuse with reduced oral intake, nutritional supplement as advised, dietitian consult and input appreciated -CT abdomen and pelvis without acute findings -Chest x-ray without acute findings -Follow-up with gastroenterologist for endoluminal evaluation to rule out underlying malignancy given tobacco  and alcohol abuse  5)ETOH Abuse--no frank DTs at this time, continue lorazepam per CIWA protocol, continue thiamine and folic acid -Hypomagnesemia with magnesium of 1.3 noted, IV magnesium given  6) acute on chronic anemia, hemoglobin down to 8.2 from 10.1 on admission, patient with history of alcohol abuse, --EGD and colonoscopy with Dr. Laural Golden as outpatient advised   Code Status : Full  Family Communication:   NA (patient is alert, awake and coherent)  Disposition Plan  : home  Discharge Condition: stable  Follow UP- Follow-up with gastroenterologist Dr. Laural Golden ([address] , [address], --[phone])  for endoluminal evaluation to rule out underlying malignancy given tobacco and alcohol abuse and concerns about possible weight loss   Diet and Activity recommendation:  As advised  Discharge Instructions     Discharge Instructions    Call MD for:  difficulty breathing, headache or visual disturbances   Complete by: As directed    Call MD for:  persistant dizziness or light-headedness   Complete by: As directed    Call MD for:  persistant nausea and vomiting   Complete by: As directed    Call MD for:  temperature >100.4   Complete by: As directed    Diet - low sodium heart healthy   Complete by: As directed    Discharge instructions   Complete by: As directed    1) you were treated for E. coli infection in your urine your kidney--- please take and complete amoxicillin antibiotic as prescribed 2) follow-up to primary care physician within a week for recheck and reevaluation 3) abstinence from tobacco and alcohol advised 4)Follow-up with gastroenterologist Dr. Laural Golden ([address] , [address], --[phone])  for endoluminal evaluation to rule out underlying malignancy given tobacco and alcohol abuse and concerns about possible weight loss   Increase activity slowly   Complete by: As directed         Discharge Medications      Allergies as of 01/19/2019   No Known Allergies     Medication List    STOP taking these medications   ibuprofen 600 MG tablet Commonly known as: ADVIL     TAKE these medications   acetaminophen 325 MG tablet Commonly known as: TYLENOL Take 2 tablets (650 mg total) by mouth every 6 (six) hours as needed for mild pain, fever or headache (or Fever >/= 101).   amoxicillin 500 MG capsule Commonly known as: AMOXIL Take 1 capsule (500 mg total) by mouth 3 (three) times daily for 5 days.   folic acid 1 MG tablet Commonly known as: FOLVITE Take 1 tablet (1 mg total) by mouth daily. Start taking on: January 20, 2019   thiamine 100 MG tablet Take 1 tablet (100  mg total) by mouth daily. Start taking on: January 20, 2019       Major procedures and Radiology Reports - PLEASE review detailed and final reports for all details, in brief -    Dg Lumbar Spine Complete  Result Date: 12/23/2018 CLINICAL DATA:  Low back pain and right leg pain EXAM: LUMBAR SPINE - COMPLETE 4+ VIEW COMPARISON:  None. FINDINGS: Six non rib-bearing lumbar type vertebral segments. Vertebral body heights are maintained. No fracture identified. Relative preservation of the intervertebral disc spaces. Mild multilevel endplate spurring. Minimal lower lumbar facet arthrosis. IMPRESSION: 1. No acute fracture or static subluxation of the lumbar spine. 2. Mild degenerative changes. Electronically Signed   By: Davina Poke M.D.   On: 12/23/2018 12:30   Ct Abdomen Pelvis W Contrast  Result Date: 01/16/2019 CLINICAL DATA:  Progressive right-sided abdominal pain. Unintentional weight loss over past 2 months. EXAM: CT ABDOMEN AND PELVIS WITH CONTRAST TECHNIQUE: Multidetector CT imaging of the abdomen and pelvis was performed using the standard protocol following bolus administration of intravenous contrast. CONTRAST:  143mL OMNIPAQUE IOHEXOL 300 MG/ML  SOLN COMPARISON:  None. FINDINGS: Lower Chest: No acute findings.  Hepatobiliary: No hepatic masses identified. Gallbladder is unremarkable. Diffuse biliary ductal dilatation is seen to the level of the pancreatic head, with common bile duct measuring approximately 13 mm in diameter. Pancreas: Diffuse pancreatic calcifications are seen, consistent with chronic pancreatitis. No pancreatic mass identified. No evidence of acute peripancreatic inflammatory changes. A new rim enhancing fluid collection is seen adjacent to the pancreatic tail which measures 2.2 x 1.2 cm, consistent with a small pancreatic pseudocyst. Spleen: Within normal limits in size and appearance. Adrenals/Urinary Tract: Multiple ill-defined areas of decreased parenchymal enhancement are seen in the right kidney with mild perinephric stranding. These findings are consistent with pyelonephritis. No evidence of renal mass or abscess. No evidence of ureteral calculi or hydronephrosis. Normal appearance of left kidney. Mild diffuse bladder wall thickening, suspicious for cystitis. Stomach/Bowel: No evidence of obstruction, inflammatory process or abnormal fluid collections. Vascular/Lymphatic: No pathologically enlarged lymph nodes. No abdominal aortic aneurysm. Aortic atherosclerosis. 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 Reproductive:  No mass or other significant abnormality. Other:  None. Musculoskeletal:  No suspicious bone lesions identified. IMPRESSION: Acute right pyelonephritis. No evidence of renal abscess or hydronephrosis. Mild diffuse bladder wall thickening, suspicious for cystitis. Diffuse biliary ductal dilatation, with distal common bile duct obstruction which appears to be due to chronic calcific pancreatitis. No radiographic evidence of pancreatic mass or acute pancreatitis. Small pancreatic pseudocyst adjacent to pancreatic tail. Chronic splenic vein thrombosis, with portosystemic collaterals in gastrohepatic and  gastrosplenic ligaments. Electronically Signed   By: Marlaine Hind M.D.   On: 01/16/2019 17:00   Dg Knee Complete 4 Views Right  Result Date: 12/23/2018 CLINICAL DATA:  Pain EXAM: RIGHT KNEE - COMPLETE 4+ VIEW COMPARISON:  None. FINDINGS: No fracture or dislocation of the right knee. 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 Moderate, nonspecific knee joint effusion. Heterotopic ossification about the lateral distal femur. IMPRESSION: No fracture or dislocation of the right knee. 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 Moderate, nonspecific knee joint effusion. Heterotopic ossification about the lateral distal femur, consistent with prior trauma. Electronically Signed   By: Eddie Candle M.D.   On: 12/23/2018 12:29  Micro Results    Recent Results (from the past 240 hour(s))  Urine Culture     Status: Abnormal   Collection Time: 01/16/19  6:55 PM   Specimen: Urine, Clean Catch  Result Value Ref Range Status   Specimen Description   Final    URINE, CLEAN CATCH Performed at [facility], [address] [place], [place] [address]    Special Requests   Final    NONE Performed at [facility], [address] [place], [place] [address]    Culture >=100,000 COLONIES/mL ESCHERICHIA COLI (A)  Final   Report Status 01/19/2019 FINAL  Final   Organism ID, Bacteria ESCHERICHIA COLI (A)  Final      Susceptibility   Escherichia coli - MIC*    AMPICILLIN <=2 SENSITIVE Sensitive     CEFAZOLIN <=4 SENSITIVE Sensitive     CEFTRIAXONE <=1 SENSITIVE Sensitive     CIPROFLOXACIN <=0.25 SENSITIVE Sensitive     GENTAMICIN <=1 SENSITIVE Sensitive     IMIPENEM <=0.25 SENSITIVE Sensitive     NITROFURANTOIN <=16 SENSITIVE Sensitive     TRIMETH/SULFA <=20 SENSITIVE Sensitive     AMPICILLIN/SULBACTAM <=2 SENSITIVE  Sensitive     PIP/TAZO <=4 SENSITIVE Sensitive     Extended ESBL NEGATIVE Sensitive     * >=100,000 COLONIES/mL ESCHERICHIA COLI  SARS CORONAVIRUS 2 (TAT 6-24 HRS) Nasopharyngeal Nasopharyngeal Swab     Status: None   Collection Time: 01/16/19  7:28 PM   Specimen: Nasopharyngeal Swab  Result Value Ref Range Status   SARS Coronavirus 2 NEGATIVE NEGATIVE Final    Comment: (NOTE) SARS-CoV-2 target nucleic acids are NOT DETECTED. The SARS-CoV-2 RNA is generally detectable in upper and lower respiratory specimens during the acute phase of infection. Negative results do not preclude SARS-CoV-2 infection, do not rule out co-infections with other pathogens, and should not be used as the sole basis for treatment or other patient management decisions. Negative results must be combined with clinical observations, patient history, and epidemiological information. The expected result is Negative. Fact Sheet for Patients: SugarRoll.be Fact Sheet for Healthcare Providers: https://www.woods-mathews.com/ This test is not yet approved or cleared by the [place] FDA and  has been authorized for detection and/or diagnosis of SARS-CoV-2 by FDA under an Emergency Use Authorization (EUA). This EUA will remain  in effect (meaning this test can be used) for the duration of the COVID-19 declaration under Section 56 4(b)(1) of the Act, 21 U.S.C. section 360bbb-3(b)(1), unless the authorization is terminated or revoked sooner. Performed at [facility], [place] [address] [place], [place] [address]        Today   Subjective    Marlow Guard today has no new complaints, -Voiding well without dysuria or hematuria, no further flank pain, no vomiting or diarrhea -No fevers no chills          Patient has been seen and examined prior to discharge   Objective   Blood pressure 107/86, pulse 75, temperature 100.1 F (37.8 C), temperature source Oral,  resp. rate 17, height 5\' 10"  (1.778 m), weight 57.5 kg, SpO2 100 %.   Intake/Output Summary (Last 24 hours) at 01/19/2019 1259 Last data filed at 01/19/2019 0700 Gross per 24 hour  Intake 3218.68 ml  Output 1900 ml  Net 1318.68 ml    Exam Gen:- Awake Alert, no acute distress  HEENT:- [place].AT, No sclera icterus Mouth--poor dentition Neck-Supple Neck,No JVD,.  Lungs-  CTAB , good air movement bilaterally  CV- S1, S2 normal,  regular Abd-  +ve B.Sounds, Abd Soft, No CVA tenderness tenderness,    -Prior laparotomy scar noted Extremity/Skin:- No  edema,   good pulses Psych-affect is appropriate, oriented x3 Neuro-no new focal deficits, no tremors    Data Review   CBC w Diff:  Lab Results  Component Value Date   WBC 4.4 01/19/2019   HGB 8.2 (L) 01/19/2019   HCT 26.4 (L) 01/19/2019   PLT 202 01/19/2019    CMP:  Lab Results  Component Value Date   NA 132 (L) 01/19/2019   K 3.4 (L) 01/19/2019   CL 102 01/19/2019   CO2 23 01/19/2019   BUN 10 01/19/2019   CREATININE 0.77 01/19/2019   PROT 6.6 01/19/2019   ALBUMIN 2.5 (L) 01/19/2019   BILITOT 0.3 01/19/2019   ALKPHOS 115 01/19/2019   AST 38 01/19/2019   ALT 28 01/19/2019  .   Total Discharge time is about 33 minutes  Roxan Hockey M.D on 01/19/2019 at 12:59 PM  Go to [email or web address] -  for contact info  [facility] - Office  [phone]

## 2019-01-22 ENCOUNTER — Telehealth: Payer: Self-pay

## 2019-01-22 NOTE — Telephone Encounter (Signed)
Pt. Eligibility is 01/22/2019 till 01/22/2020 with [facility]. Faxed over pt. medassist application and other docs for [facility]ist.  Drema Halon

## 2019-02-06 ENCOUNTER — Encounter: Payer: Self-pay | Admitting: Physician Assistant

## 2019-02-06 ENCOUNTER — Ambulatory Visit: Payer: Self-pay | Admitting: Physician Assistant

## 2019-02-06 VITALS — BP 90/72 | HR 94 | Temp 98.1°F | Wt 133.4 lb

## 2019-02-06 DIAGNOSIS — D649 Anemia, unspecified: Secondary | ICD-10-CM

## 2019-02-06 DIAGNOSIS — F17200 Nicotine dependence, unspecified, uncomplicated: Secondary | ICD-10-CM

## 2019-02-06 DIAGNOSIS — E876 Hypokalemia: Secondary | ICD-10-CM

## 2019-02-06 DIAGNOSIS — Z7689 Persons encountering health services in other specified circumstances: Secondary | ICD-10-CM

## 2019-02-06 DIAGNOSIS — R739 Hyperglycemia, unspecified: Secondary | ICD-10-CM

## 2019-02-06 DIAGNOSIS — Z13220 Encounter for screening for lipoid disorders: Secondary | ICD-10-CM

## 2019-02-06 DIAGNOSIS — E46 Unspecified protein-calorie malnutrition: Secondary | ICD-10-CM

## 2019-02-06 DIAGNOSIS — R634 Abnormal weight loss: Secondary | ICD-10-CM

## 2019-02-06 DIAGNOSIS — Z1211 Encounter for screening for malignant neoplasm of colon: Secondary | ICD-10-CM

## 2019-02-06 DIAGNOSIS — Z125 Encounter for screening for malignant neoplasm of prostate: Secondary | ICD-10-CM

## 2019-02-06 DIAGNOSIS — E871 Hypo-osmolality and hyponatremia: Secondary | ICD-10-CM

## 2019-02-06 DIAGNOSIS — F1010 Alcohol abuse, uncomplicated: Secondary | ICD-10-CM

## 2019-02-06 NOTE — Progress Notes (Signed)
BP 90/72   Pulse 94   Temp 98.1 F (36.7 C)   Wt 133 lb 6.4 oz (60.5 kg)   SpO2 99%   BMI 19.14 kg/m    Subjective:    Patient ID: Tanner Thomas, male    DOB: [birth year], 63 y.o.   MRN: [id number]  HPI: Tanner Thomas is a 63 y.o. male presenting on 02/06/2019 for New Patient (Initial Visit) (pt states he takes a medicine but is unsure of the name and what it's for. pt states he got it at a medicine place in [place]. )   HPI   Pt had a negative covid 19 screening questionnaire   Pt is a poor historian.  Some History obtained from recent hospital records.   Pt is not forthcoming or honest with answers.  Pt was recently in hospital for pyelonephritis  Pt states abdominal pain went away.   Tanner Thomas is a 63 y.o. male with medical history significant for alcohol abuse.  Pt with chronic intermittent lower abdominal pain over the past 1 to 2 years. Patient also reports 25 pound weight loss over the past several months. He Reports good p.o. intake.  He has never had a colonoscopy.  Family history of cancers.  Smokes pack cigarettes daily.  No cough or difficulty breathing or chest pain.  He also smokes marijuana (pt denies but + UDS )  Patient moved from [place] to [place] about a month ago to be closer to family.  Currently lives with his sister.  Patient has not been receiving regular medical care.  He has hardly been to a hospital clinic except for when he had surgery on his pancreas about 5 years ago.  He is unsure what type of procedure was done or why he needed it.  Pt does not work.  He has no reason he just hasn't for years and years.   He started losing weight "a couple months back"  Pt does not consider himself an alcoholic.  He says he does drink a lot and he knows it is bad for him.    Relevant past medical, surgical, family and social history reviewed and updated as indicated. Interim medical history since our last visit reviewed. Allergies and medications  reviewed and updated.  No current outpatient medications on file.   Review of Systems  Per HPI unless specifically indicated above     Objective:    BP 90/72   Pulse 94   Temp 98.1 F (36.7 C)   Wt 133 lb 6.4 oz (60.5 kg)   SpO2 99%   BMI 19.14 kg/m   Wt Readings from Last 3 Encounters:  02/06/19 133 lb 6.4 oz (60.5 kg)  01/17/19 126 lb 12.2 oz (57.5 kg)  12/23/18 150 lb (68 kg)    Physical Exam Vitals signs reviewed.  Constitutional:      General: He is not in acute distress.    Appearance: He is well-developed.     Comments: Pt does not look healthy.  He is thin and disheveled  HENT:     Head: Normocephalic and atraumatic.  Eyes:     Conjunctiva/sclera: Conjunctivae normal.     Pupils: Pupils are equal, round, and reactive to light.  Neck:     Musculoskeletal: Neck supple.     Thyroid: No thyromegaly.  Cardiovascular:     Rate and Rhythm: Normal rate and regular rhythm.  Pulmonary:     Effort: Pulmonary effort is normal.     Breath  sounds: Normal breath sounds. No wheezing or rales.  Abdominal:     General: Bowel sounds are normal.     Palpations: Abdomen is soft. There is no mass.     Tenderness: There is no abdominal tenderness.  Musculoskeletal:     Right lower leg: No edema.     Left lower leg: No edema.  Lymphadenopathy:     Cervical: No cervical adenopathy.  Skin:    General: Skin is warm and dry.     Findings: No rash.  Neurological:     Mental Status: He is alert and oriented to person, place, and time.  Psychiatric:        Attention and Perception: Attention normal.        Speech: Speech normal.        Behavior: Behavior is cooperative.           Assessment & Plan:   Encounter Diagnoses  Name Primary?  . Encounter to establish care Yes  . Anemia, unspecified type   . Tobacco use disorder   . Screening for malignant neoplasm of prostate   . Screening cholesterol level   . Hypokalemia   . Elevated serum glucose   . Hyponatremia    . Protein-calorie malnutrition, unspecified severity ([place])   . Alcohol abuse   . Weight loss   . Screening for colon cancer      -will check baseline labs -pt to Start OTC iron  -Refer to GI for colonoscopy/eval wt loss -Pt does not have a cell phone -pt is given application for [facility] financial assistance -pt counseled on eating enough calories every day -encouraged pt to cut back on drinking.  Recommended no more than 7 drinks/week at the most.  Encouraged him to consider AA meetings or other counseling services -encouraged smoking cessation -pt to follow up 1 month.  He is to contact office sooner prn

## 2019-02-06 NOTE — Patient Instructions (Addendum)
  Start over-the-counter iron supplement Cut back alcohol drinking Cut back smoking   -----------------------------------

## 2019-02-12 ENCOUNTER — Encounter: Payer: Self-pay | Admitting: Internal Medicine

## 2019-03-05 ENCOUNTER — Telehealth: Payer: Self-pay

## 2019-03-05 ENCOUNTER — Other Ambulatory Visit (HOSPITAL_COMMUNITY)
Admission: RE | Admit: 2019-03-05 | Discharge: 2019-03-05 | Disposition: A | Discharge status: Home / Self Care | Payer: Self-pay | Source: Ambulatory Visit | Attending: Nurse Practitioner | Admitting: Nurse Practitioner

## 2019-03-05 ENCOUNTER — Other Ambulatory Visit: Payer: Self-pay

## 2019-03-05 ENCOUNTER — Other Ambulatory Visit (HOSPITAL_COMMUNITY)
Admission: RE | Admit: 2019-03-05 | Discharge: 2019-03-05 | Disposition: A | Discharge status: Home / Self Care | Payer: Self-pay | Source: Ambulatory Visit | Attending: Internal Medicine | Admitting: Internal Medicine

## 2019-03-05 ENCOUNTER — Encounter (HOSPITAL_COMMUNITY)
Admission: RE | Admit: 2019-03-05 | Discharge: 2019-03-05 | Disposition: A | Discharge status: Home / Self Care | Payer: Self-pay | Source: Ambulatory Visit | Attending: Internal Medicine | Admitting: Internal Medicine

## 2019-03-05 ENCOUNTER — Ambulatory Visit (INDEPENDENT_AMBULATORY_CARE_PROVIDER_SITE_OTHER): Payer: Self-pay | Admitting: Nurse Practitioner

## 2019-03-05 ENCOUNTER — Encounter (HOSPITAL_COMMUNITY): Payer: Self-pay

## 2019-03-05 ENCOUNTER — Encounter: Payer: Self-pay | Admitting: Nurse Practitioner

## 2019-03-05 DIAGNOSIS — R634 Abnormal weight loss: Secondary | ICD-10-CM | POA: Insufficient documentation

## 2019-03-05 DIAGNOSIS — D649 Anemia, unspecified: Secondary | ICD-10-CM

## 2019-03-05 DIAGNOSIS — R935 Abnormal findings on diagnostic imaging of other abdominal regions, including retroperitoneum: Secondary | ICD-10-CM | POA: Insufficient documentation

## 2019-03-05 DIAGNOSIS — D509 Iron deficiency anemia, unspecified: Secondary | ICD-10-CM | POA: Insufficient documentation

## 2019-03-05 LAB — CBC WITH DIFFERENTIAL/PLATELET
Abs Immature Granulocytes: 0.01 10*3/uL (ref 0.00–0.07)
Basophils Absolute: 0 10*3/uL (ref 0.0–0.1)
Basophils Relative: 0 %
Eosinophils Absolute: 0.2 10*3/uL (ref 0.0–0.5)
Eosinophils Relative: 4 %
HCT: 37.8 % — ABNORMAL LOW (ref 39.0–52.0)
Hemoglobin: 11.9 g/dL — ABNORMAL LOW (ref 13.0–17.0)
Immature Granulocytes: 0 %
Lymphocytes Relative: 41 %
Lymphs Abs: 2.2 10*3/uL (ref 0.7–4.0)
MCH: 28.4 pg (ref 26.0–34.0)
MCHC: 31.5 g/dL (ref 30.0–36.0)
MCV: 90.2 fL (ref 80.0–100.0)
Monocytes Absolute: 0.7 10*3/uL (ref 0.1–1.0)
Monocytes Relative: 12 %
Neutro Abs: 2.4 10*3/uL (ref 1.7–7.7)
Neutrophils Relative %: 43 %
Platelets: 198 10*3/uL (ref 150–400)
RBC: 4.19 MIL/uL — ABNORMAL LOW (ref 4.22–5.81)
RDW: 14.6 % (ref 11.5–15.5)
WBC: 5.5 10*3/uL (ref 4.0–10.5)
nRBC: 0 % (ref 0.0–0.2)

## 2019-03-05 LAB — COMPREHENSIVE METABOLIC PANEL
ALT: 22 U/L (ref 0–44)
AST: 28 U/L (ref 15–41)
Albumin: 3.6 g/dL (ref 3.5–5.0)
Alkaline Phosphatase: 110 U/L (ref 38–126)
Anion gap: 8 (ref 5–15)
BUN: 8 mg/dL (ref 8–23)
CO2: 23 mmol/L (ref 22–32)
Calcium: 9.2 mg/dL (ref 8.9–10.3)
Chloride: 104 mmol/L (ref 98–111)
Creatinine, Ser: 0.59 mg/dL — ABNORMAL LOW (ref 0.61–1.24)
GFR calc Af Amer: >60 mL/min (ref >60)
GFR calc non Af Amer: >60 mL/min (ref >60)
Glucose, Bld: 92 mg/dL (ref 70–99)
Potassium: 3.7 mmol/L (ref 3.5–5.1)
Sodium: 135 mmol/L (ref 135–145)
Total Bilirubin: 0.8 mg/dL (ref 0.3–1.2)
Total Protein: 7.8 g/dL (ref 6.5–8.1)

## 2019-03-05 LAB — IRON: Iron: 75 ug/dL (ref 45–182)

## 2019-03-05 LAB — LIPASE, BLOOD: Lipase: 17 U/L (ref 11–51)

## 2019-03-05 LAB — SARS CORONAVIRUS 2 (TAT 6-24 HRS): SARS Coronavirus 2: NEGATIVE

## 2019-03-05 LAB — FERRITIN: Ferritin: 64 ng/mL (ref 24–336)

## 2019-03-05 NOTE — Telephone Encounter (Signed)
Melanie at [facility] called office, RMR wants to do TCS/-/+EGD w/Propofol tomorrow if possible. Tried to call pt, no answer, LMOVM for return call ASAP.

## 2019-03-05 NOTE — Progress Notes (Signed)
Primary Care Physician:  Soyla Dryer, PA-C Primary Gastroenterologist:  Dr. Gala Romney  Chief Complaint  Patient presents with  . Anemia  . Consult    TCS never done prior. No FH of colon ca or polyps  . loss of weight    30 lbs in 5-6 months. eats well per pt.    HPI:   Tanner Thomas is a 63 y.o. male who presents on referral from primary care for weight loss, anemia, and to schedule a colonoscopy.  Reviewed information provided with referral including office visit dated 02/06/2019 which was an encounter to establish care and discuss multiple issues.  Noted history of alcohol abuse.  Noted 25 pound weight loss over the past several months despite good oral intake.  No history of colonoscopy.  Just relocated to [place] from [place] a month ago to be closer to family.  He had some sort of pancreatic procedure/surgery, although he is unsure what was done.  He admits to drinking a lot but denies that he is an alcoholic.  Recommended referral to GI for colonoscopy/evaluation for weight loss.  Recommended reduction in alcohol intake with no more than 7 drinks a week.  Reviewed abdominal imaging dated 01/16/2019.  The pancreas has the appearance of chronic pancreatitis but no mass identified.  A new rim enhancing fluid collection adjacent to the pancreatic tail measures 2.2 x 1.2 cm consistent with a small pancreatic pseudocyst.  Also noted diffuse biliary ductal dilation measuring 13 mm in diameter to the level of the pancreatic head.  Recent CBCs found hemoglobin on 01/16/2019 at 10.1 which declined to 8.2 on 01/17/2019.  LFTs checked during hospitalization for acute pyelonephritis recently found no elevated LFTs despite biliary ductal dilation and obstructed CBD.  No history of colonoscopy found in our system.  Today he states he's doing ok overall. He has never been told he's anemic before his recent hospitalization. He initially states he had a colonoscopy at [facility] a month ago, but  when further questioned he states he wasn't put to sleep, no bowel prep; no report found. Not sure what "colonoscopy" he had. Denies hematochezia, melena. Has had ongoing weight loss of 30 lb in 3-6 months (subjectively); Objectively hard to quantify due to limited Epic history but is down 2-3 lbs in the past 3 weeks. Has LLQ abdominal pain, intermittent, at it's worst is 8/10, crampy. Has a bowel movement about every day, typically postprandially. Stools sometimes hard, depending on what he eats; denies diarrhea. Denies GERD symptoms. Denies N/V, fever, chills. Denies URI or flu-like symptoms. Denies loss of sense of taste or smell. Denies chest pain, dyspnea, dizziness, lightheadedness, syncope, near syncope. Denies any other upper or lower GI symptoms.  Denies previous [place] in [place] for pancreas problems. States later he did have pancreas surgery in [place] but doesn't know what was done.  States he only drinks 3-4 drinks a week. Used to drink 12-13 a week but seems not sure?  Not currently on any medications.  Past Medical History:  Diagnosis Date  . Known health problems: none     Past Surgical History:  Procedure Laterality Date  . PANCREAS SURGERY      No current outpatient medications on file.   No current facility-administered medications for this visit.     Allergies as of 03/05/2019  . (No Known Allergies)    Family History  Problem Relation Age of Onset  . Heart disease Father   . Colon cancer Neg Hx   .  Gastric cancer Neg Hx   . Esophageal cancer Neg Hx     Social History   Socioeconomic History  . Marital status: Single    Spouse name: Not on file  . Number of children: Not on file  . Years of education: Not on file  . Highest education level: Not on file  Occupational History  . Not on file  Social Needs  . Financial resource strain: Not on file  . Food insecurity    Worry: Not on file    Inability: Not on file  . Transportation needs    Medical:  Not on file    Non-medical: Not on file  Tobacco Use  . Smoking status: Current Every Day Smoker    Packs/day: 1.00    Types: Cigarettes  . Smokeless tobacco: Never Used  Substance and Sexual Activity  . Alcohol use: Yes    Alcohol/week: 13.0 standard drinks    Types: 13 Cans of beer per week    Comment: 03/04/19 states 3-4 a week; previously: as of 02-06-19 drinks about 12-13 beers weekly  . Drug use: Not Currently    Types: Marijuana    Comment: pt denies but + UDS 10/20  . Sexual activity: Not on file  Lifestyle  . Physical activity    Days per week: Not on file    Minutes per session: Not on file  . Stress: Not on file  Relationships  . Social Herbalist on phone: Not on file    Gets together: Not on file    Attends religious service: Not on file    Active member of club or organization: Not on file    Attends meetings of clubs or organizations: Not on file    Relationship status: Not on file  . Intimate partner violence    Fear of current or ex partner: Not on file    Emotionally abused: Not on file    Physically abused: Not on file    Forced sexual activity: Not on file  Other Topics Concern  . Not on file  Social History Narrative  . Not on file    Review of Systems: General: Negative for anorexia, weight loss, fever, chills, fatigue, weakness. ENT: Negative for hoarseness, difficulty swallowing. CV: Negative for chest pain, angina, palpitations, peripheral edema.  Respiratory: Negative for dyspnea at rest, cough, sputum, wheezing.  GI: See history of present illness. MS: Admits chronic joint pain.  Derm: Negative for rash or itching.  Endo: Negative for unusual weight change.  Heme: Negative for bruising or bleeding. Allergy: Negative for rash or hives.    Physical Exam: BP 115/81   Pulse 96   Temp (!) 97 F (36.1 C) (Oral)   Ht 5\' 10"  (1.778 m)   Wt 131 lb 12.8 oz (59.8 kg)   BMI 18.91 kg/m  General:   Alert and oriented. Pleasant and  cooperative. Appears thin and almost cachectic.  Head:  Normocephalic and atraumatic. Eyes:  Without icterus, sclera clear and conjunctiva pink.  Ears:  Normal auditory acuity. Cardiovascular:  S1, S2 present without murmurs appreciated. Extremities without clubbing or edema. Respiratory:  Clear to auscultation bilaterally. No wheezes, rales, or rhonchi. No distress.  Gastrointestinal:  +BS, soft, and non-distended. Mild right lower quadrant TTP. No HSM noted. No guarding or rebound. No masses appreciated.  Rectal:  Deferred  Musculoskalatal:  Symmetrical without gross deformities. Intercostal spaces sunken in. Skin:  Intact without significant lesions or rashes. Neurologic:  Alert  and oriented x4;  grossly normal neurologically. Psych:  Alert and cooperative. Normal mood and affect. Heme/Lymph/Immune: No excessive bruising noted.    03/05/2019 9:45 AM   Disclaimer: This note was dictated with voice recognition software. Similar sounding words can inadvertently be transcribed and may not be corrected upon review.

## 2019-03-05 NOTE — Patient Instructions (Signed)
Your health issues we discussed today were:   Anemia ("blood loss") with weight loss: 1. Have your labs done since you are able to 2. We will request your records from [facility] 3. We will schedule a colonoscopy and upper endoscopy for you 4. Further recommendations will follow 5. Call us if you have any worsening or severe symptoms such as blood in your stools  Overall I recommend:  1. Continue your other current medications 2. Call us if you have any questions or concerns 3. Follow-up in 3 months   Because of recent events of COVID-19 ("Coronavirus"), follow CDC recommendations:  1. Wash your hand frequently 2. Avoid touching your face 3. Stay away from people who are sick 4. If you have symptoms such as fever, cough, shortness of breath then call your healthcare provider for further guidance 5. If you are sick, STAY AT HOME unless otherwise directed by your healthcare provider. 6. Follow directions from state and national officials regarding staying safe   At [facility] we value your feedback. You may receive a survey about your visit today. Please share your experience as we strive to create trusting relationships with our patients to provide genuine, compassionate, quality care.  We appreciate your understanding and patience as we review any laboratory studies, imaging, and other diagnostic tests that are ordered as we care for you. Our office policy is 5 business days for review of these results, and any emergent or urgent results are addressed in a timely manner for your best interest. If you do not hear from our office in 1 week, please contact us.   We also encourage the use of MyChart, which contains your medical information for your review as well. If you are not enrolled in this feature, an access code is on this after visit summary for your convenience. Thank you for allowing [place] to be involved in your care.  It was great to see you today!  I hope you  have a Happy Thanksgiving!!

## 2019-03-05 NOTE — Assessment & Plan Note (Signed)
The patient admits a subjective weight loss of 30 pounds in the past 3 to 6 months.  Difficult to quantify objectively due to limited history as he is just recently moved to the area.  However, I am inclined to believe he has had a substantial weight loss.  He appears quite thin, almost cachectic.  He also has a significant history of alcohol abuse, although at this point he states he only drinks 3-4 drinks a week.  Apparent chronic pancreatitis based on imaging while he was in the hospital showing a calcified pancreatic head and CBD obstruction, although LFTs were normal.  We will proceed with endoscopic evaluation.  Given his weight loss, new onset anemia I am concerned about possible carcinogenic process.  Proceed with TCS and EGD on propofol/MAC with Dr. Gala Romney in near future: the risks, benefits, and alternatives have been discussed with the patient in detail. The patient states understanding and desires to proceed.  The patient is not currently on any medications.  He does have a history of alcohol abuse.  Currently drinks 3-4 drinks a week, although this is questionable given primary care's notes about his misleading related to his alcohol intake.  Denies drug use although his urine drug screen was positive for marijuana on 01/16/2019.  Because of all these factors we will plan for the procedure on propofol/MAC to promote adequate sedation.

## 2019-03-05 NOTE — Telephone Encounter (Signed)
RMR called office, wants pt to have TCS/EGD w/Propofol tomorrow at 9:45am.   Spoke to pt, he can do procedure tomorrow. He is at hospital now, had labs drawn. Orders entered.  Endo scheduler advised for him to go to COVID testing site now. Pre-op nurse will call him later today.  Informed pt, advised him to come back to our office to get procedure instructions. He has already been given Suprep sample.

## 2019-03-05 NOTE — H&P (View-Only) (Signed)
Primary Care Physician:  Soyla Dryer, PA-C Primary Gastroenterologist:  Dr. Gala Romney  Chief Complaint  Patient presents with  . Anemia  . Consult    TCS never done prior. No FH of colon ca or polyps  . loss of weight    30 lbs in 5-6 months. eats well per pt.    HPI:   Tanner Thomas is a 63 y.o. male who presents on referral from primary care for weight loss, anemia, and to schedule a colonoscopy.  Reviewed information provided with referral including office visit dated 02/06/2019 which was an encounter to establish care and discuss multiple issues.  Noted history of alcohol abuse.  Noted 25 pound weight loss over the past several months despite good oral intake.  No history of colonoscopy.  Just relocated to [place] from [place] a month ago to be closer to family.  He had some sort of pancreatic procedure/surgery, although he is unsure what was done.  He admits to drinking a lot but denies that he is an alcoholic.  Recommended referral to GI for colonoscopy/evaluation for weight loss.  Recommended reduction in alcohol intake with no more than 7 drinks a week.  Reviewed abdominal imaging dated 01/16/2019.  The pancreas has the appearance of chronic pancreatitis but no mass identified.  A new rim enhancing fluid collection adjacent to the pancreatic tail measures 2.2 x 1.2 cm consistent with a small pancreatic pseudocyst.  Also noted diffuse biliary ductal dilation measuring 13 mm in diameter to the level of the pancreatic head.  Recent CBCs found hemoglobin on 01/16/2019 at 10.1 which declined to 8.2 on 01/17/2019.  LFTs checked during hospitalization for acute pyelonephritis recently found no elevated LFTs despite biliary ductal dilation and obstructed CBD.  No history of colonoscopy found in our system.  Today he states he's doing ok overall. He has never been told he's anemic before his recent hospitalization. He initially states he had a colonoscopy at [facility] a month ago, but  when further questioned he states he wasn't put to sleep, no bowel prep; no report found. Not sure what "colonoscopy" he had. Denies hematochezia, melena. Has had ongoing weight loss of 30 lb in 3-6 months (subjectively); Objectively hard to quantify due to limited Epic history but is down 2-3 lbs in the past 3 weeks. Has LLQ abdominal pain, intermittent, at it's worst is 8/10, crampy. Has a bowel movement about every day, typically postprandially. Stools sometimes hard, depending on what he eats; denies diarrhea. Denies GERD symptoms. Denies N/V, fever, chills. Denies URI or flu-like symptoms. Denies loss of sense of taste or smell. Denies chest pain, dyspnea, dizziness, lightheadedness, syncope, near syncope. Denies any other upper or lower GI symptoms.  Denies previous [place] in [place] for pancreas problems. States later he did have pancreas surgery in [place] but doesn't know what was done.  States he only drinks 3-4 drinks a week. Used to drink 12-13 a week but seems not sure?  Not currently on any medications.  Past Medical History:  Diagnosis Date  . Known health problems: none     Past Surgical History:  Procedure Laterality Date  . PANCREAS SURGERY      No current outpatient medications on file.   No current facility-administered medications for this visit.     Allergies as of 03/05/2019  . (No Known Allergies)    Family History  Problem Relation Age of Onset  . Heart disease Father   . Colon cancer Neg Hx   .  Gastric cancer Neg Hx   . Esophageal cancer Neg Hx     Social History   Socioeconomic History  . Marital status: Single    Spouse name: Not on file  . Number of children: Not on file  . Years of education: Not on file  . Highest education level: Not on file  Occupational History  . Not on file  Social Needs  . Financial resource strain: Not on file  . Food insecurity    Worry: Not on file    Inability: Not on file  . Transportation needs    Medical:  Not on file    Non-medical: Not on file  Tobacco Use  . Smoking status: Current Every Day Smoker    Packs/day: 1.00    Types: Cigarettes  . Smokeless tobacco: Never Used  Substance and Sexual Activity  . Alcohol use: Yes    Alcohol/week: 13.0 standard drinks    Types: 13 Cans of beer per week    Comment: 03/04/19 states 3-4 a week; previously: as of 02-06-19 drinks about 12-13 beers weekly  . Drug use: Not Currently    Types: Marijuana    Comment: pt denies but + UDS 10/20  . Sexual activity: Not on file  Lifestyle  . Physical activity    Days per week: Not on file    Minutes per session: Not on file  . Stress: Not on file  Relationships  . Social Herbalist on phone: Not on file    Gets together: Not on file    Attends religious service: Not on file    Active member of club or organization: Not on file    Attends meetings of clubs or organizations: Not on file    Relationship status: Not on file  . Intimate partner violence    Fear of current or ex partner: Not on file    Emotionally abused: Not on file    Physically abused: Not on file    Forced sexual activity: Not on file  Other Topics Concern  . Not on file  Social History Narrative  . Not on file    Review of Systems: General: Negative for anorexia, weight loss, fever, chills, fatigue, weakness. ENT: Negative for hoarseness, difficulty swallowing. CV: Negative for chest pain, angina, palpitations, peripheral edema.  Respiratory: Negative for dyspnea at rest, cough, sputum, wheezing.  GI: See history of present illness. MS: Admits chronic joint pain.  Derm: Negative for rash or itching.  Endo: Negative for unusual weight change.  Heme: Negative for bruising or bleeding. Allergy: Negative for rash or hives.    Physical Exam: BP 115/81   Pulse 96   Temp (!) 97 F (36.1 C) (Oral)   Ht 5\' 10"  (1.778 m)   Wt 131 lb 12.8 oz (59.8 kg)   BMI 18.91 kg/m  General:   Alert and oriented. Pleasant and  cooperative. Appears thin and almost cachectic.  Head:  Normocephalic and atraumatic. Eyes:  Without icterus, sclera clear and conjunctiva pink.  Ears:  Normal auditory acuity. Cardiovascular:  S1, S2 present without murmurs appreciated. Extremities without clubbing or edema. Respiratory:  Clear to auscultation bilaterally. No wheezes, rales, or rhonchi. No distress.  Gastrointestinal:  +BS, soft, and non-distended. Mild right lower quadrant TTP. No HSM noted. No guarding or rebound. No masses appreciated.  Rectal:  Deferred  Musculoskalatal:  Symmetrical without gross deformities. Intercostal spaces sunken in. Skin:  Intact without significant lesions or rashes. Neurologic:  Alert  and oriented x4;  grossly normal neurologically. Psych:  Alert and cooperative. Normal mood and affect. Heme/Lymph/Immune: No excessive bruising noted.    03/05/2019 9:45 AM   Disclaimer: This note was dictated with voice recognition software. Similar sounding words can inadvertently be transcribed and may not be corrected upon review.

## 2019-03-05 NOTE — Assessment & Plan Note (Signed)
CT of the abdomen was completed during his recent admission for acute pyelonephritis on 01/16/2019.  The pancreas had diffuse pancreatic calcifications consistent with the appearance of chronic pancreatitis but no mass identified.  No evidence of acute pancreatitis.  A new rim-enhancing fluid collection adjacent to the pancreatic tail measuring 2.2 x 1.2 cm consistent with a small pancreatic pseudocyst.  Also noted diffuse biliary ductal dilation at 13 mm down to the level of the pancreatic head.  Likely chronic pancreatitis due to known history of chronic alcohol abuse.  No acute pancreatitis.  CBD dilation but normal LFTs and doubt true obstruction.  I will check a lipase and CMP at this time as well to his CBC as per above.  We will request previous records to try to identify past medical history and procedures.  Follow-up in 3 months.

## 2019-03-05 NOTE — Telephone Encounter (Signed)
Pt came by office and picked up procedure instructions. Reviewed instructions with pt. He verbalized understanding. He is aware pre-op nurse will call him this afternoon.

## 2019-03-05 NOTE — Assessment & Plan Note (Signed)
New onset anemia diagnosed during recent hospitalization in September for acute pyelonephritis.  Hemoglobin found to be 10.1 and then declined to ten 8.2 on 01/17/2019.  His hemoglobin remained stable on 01/19/2019 when it was also 8.2.  No overt symptomatic anemia symptoms.  I will recheck a CBC at this time to check status of his anemia.  Further endoscopic evaluation as per below.  Follow-up in 3 months.

## 2019-03-05 NOTE — Telephone Encounter (Signed)
Pt's friend called office, he is inside lab at this time for bloodwork. He will have pt call office.

## 2019-03-05 NOTE — Telephone Encounter (Signed)
Pt called office and asked what time he needs to arrive for TCS tomorrow. States pre-op nurse hasn't called him.  Called scheduler, pre-op nurse tried x3 to call him.  I called pt and spoke to him after couple attempts. Gave him phone number to endo scheduler so he can speak to pre-op nurse.

## 2019-03-06 ENCOUNTER — Ambulatory Visit (HOSPITAL_COMMUNITY): Payer: Self-pay | Admitting: Anesthesiology

## 2019-03-06 ENCOUNTER — Ambulatory Visit (HOSPITAL_COMMUNITY)
Admission: RE | Admit: 2019-03-06 | Discharge: 2019-03-06 | Disposition: A | Discharge status: Home / Self Care | Payer: Self-pay | Source: Ambulatory Visit | Attending: Internal Medicine | Admitting: Internal Medicine

## 2019-03-06 ENCOUNTER — Other Ambulatory Visit: Payer: Self-pay

## 2019-03-06 ENCOUNTER — Telehealth: Payer: Self-pay

## 2019-03-06 ENCOUNTER — Encounter (HOSPITAL_COMMUNITY): Payer: Self-pay | Admitting: *Deleted

## 2019-03-06 ENCOUNTER — Encounter (HOSPITAL_COMMUNITY)
Admission: RE | Disposition: A | Discharge status: Home / Self Care | Payer: Self-pay | Source: Ambulatory Visit | Attending: Internal Medicine

## 2019-03-06 DIAGNOSIS — F1020 Alcohol dependence, uncomplicated: Secondary | ICD-10-CM | POA: Insufficient documentation

## 2019-03-06 DIAGNOSIS — J449 Chronic obstructive pulmonary disease, unspecified: Secondary | ICD-10-CM | POA: Insufficient documentation

## 2019-03-06 DIAGNOSIS — I868 Varicose veins of other specified sites: Secondary | ICD-10-CM | POA: Insufficient documentation

## 2019-03-06 DIAGNOSIS — Z1211 Encounter for screening for malignant neoplasm of colon: Secondary | ICD-10-CM | POA: Insufficient documentation

## 2019-03-06 DIAGNOSIS — Z8249 Family history of ischemic heart disease and other diseases of the circulatory system: Secondary | ICD-10-CM | POA: Insufficient documentation

## 2019-03-06 DIAGNOSIS — Z20828 Contact with and (suspected) exposure to other viral communicable diseases: Secondary | ICD-10-CM | POA: Insufficient documentation

## 2019-03-06 DIAGNOSIS — K2950 Unspecified chronic gastritis without bleeding: Secondary | ICD-10-CM | POA: Insufficient documentation

## 2019-03-06 DIAGNOSIS — R935 Abnormal findings on diagnostic imaging of other abdominal regions, including retroperitoneum: Secondary | ICD-10-CM

## 2019-03-06 DIAGNOSIS — D649 Anemia, unspecified: Secondary | ICD-10-CM | POA: Insufficient documentation

## 2019-03-06 DIAGNOSIS — K6289 Other specified diseases of anus and rectum: Secondary | ICD-10-CM | POA: Insufficient documentation

## 2019-03-06 DIAGNOSIS — K3189 Other diseases of stomach and duodenum: Secondary | ICD-10-CM | POA: Insufficient documentation

## 2019-03-06 DIAGNOSIS — Z681 Body mass index (BMI) 19 or less, adult: Secondary | ICD-10-CM | POA: Insufficient documentation

## 2019-03-06 DIAGNOSIS — F17210 Nicotine dependence, cigarettes, uncomplicated: Secondary | ICD-10-CM | POA: Insufficient documentation

## 2019-03-06 DIAGNOSIS — R634 Abnormal weight loss: Secondary | ICD-10-CM | POA: Insufficient documentation

## 2019-03-06 DIAGNOSIS — K766 Portal hypertension: Secondary | ICD-10-CM | POA: Insufficient documentation

## 2019-03-06 DIAGNOSIS — I8510 Secondary esophageal varices without bleeding: Secondary | ICD-10-CM | POA: Insufficient documentation

## 2019-03-06 HISTORY — PX: ESOPHAGOGASTRODUODENOSCOPY (EGD) WITH PROPOFOL: SHX5813

## 2019-03-06 HISTORY — PX: BIOPSY: SHX5522

## 2019-03-06 HISTORY — PX: COLONOSCOPY WITH PROPOFOL: SHX5780

## 2019-03-06 SURGERY — COLONOSCOPY WITH PROPOFOL
Anesthesia: General

## 2019-03-06 MED ORDER — PROPOFOL 500 MG/50ML IV EMUL
INTRAVENOUS | Status: DC | PRN
  Administered 2019-03-06: 150 ug/kg/min via INTRAVENOUS

## 2019-03-06 MED ORDER — GLYCOPYRROLATE 0.2 MG/ML IJ SOLN
INTRAMUSCULAR | Status: DC | PRN
  Administered 2019-03-06: .2 mg via INTRAVENOUS

## 2019-03-06 MED ORDER — PROMETHAZINE HCL 25 MG/ML IJ SOLN: 6.2500 mg | INTRAMUSCULAR | Status: DC | PRN

## 2019-03-06 MED ORDER — KETAMINE HCL 50 MG/5ML IJ SOSY
PREFILLED_SYRINGE | INTRAMUSCULAR | Status: AC
  Filled 2019-03-06: qty 5

## 2019-03-06 MED ORDER — PROPOFOL 10 MG/ML IV BOLUS
INTRAVENOUS | Status: AC
  Filled 2019-03-06: qty 40

## 2019-03-06 MED ORDER — HYDROCODONE-ACETAMINOPHEN 7.5-325 MG PO TABS: 1.0000 | ORAL_TABLET | Freq: Once | ORAL | Status: DC | PRN

## 2019-03-06 MED ORDER — LIDOCAINE HCL 1 % IJ SOLN
INTRAMUSCULAR | Status: DC | PRN
  Administered 2019-03-06: 50 mg via INTRADERMAL

## 2019-03-06 MED ORDER — LACTATED RINGERS IV SOLN
INTRAVENOUS | Status: DC
  Administered 2019-03-06: 09:00:00 via INTRAVENOUS

## 2019-03-06 MED ORDER — MIDAZOLAM HCL 5 MG/5ML IJ SOLN
INTRAMUSCULAR | Status: DC | PRN
  Administered 2019-03-06 (×2): 1 mg via INTRAVENOUS

## 2019-03-06 MED ORDER — PHENYLEPHRINE 40 MCG/ML (10ML) SYRINGE FOR IV PUSH (FOR BLOOD PRESSURE SUPPORT)
PREFILLED_SYRINGE | INTRAVENOUS | Status: AC
  Filled 2019-03-06: qty 10

## 2019-03-06 MED ORDER — EPHEDRINE 5 MG/ML INJ
INTRAVENOUS | Status: AC
  Filled 2019-03-06: qty 10

## 2019-03-06 MED ORDER — MIDAZOLAM HCL 2 MG/2ML IJ SOLN: 0.5000 mg | Freq: Once | INTRAMUSCULAR | Status: DC | PRN

## 2019-03-06 MED ORDER — PROPOFOL 10 MG/ML IV BOLUS
INTRAVENOUS | Status: DC | PRN
  Administered 2019-03-06: 40 mg via INTRAVENOUS
  Administered 2019-03-06 (×3): 20 mg via INTRAVENOUS

## 2019-03-06 MED ORDER — CHLORHEXIDINE GLUCONATE CLOTH 2 % EX PADS: 6.0000 | MEDICATED_PAD | Freq: Once | CUTANEOUS | Status: DC

## 2019-03-06 MED ORDER — PHENYLEPHRINE HCL (PRESSORS) 10 MG/ML IV SOLN
INTRAVENOUS | Status: DC | PRN
  Administered 2019-03-06: 80 ug via INTRAVENOUS

## 2019-03-06 MED ORDER — HYDROMORPHONE HCL 1 MG/ML IJ SOLN: 0.2500 mg | INTRAMUSCULAR | Status: DC | PRN

## 2019-03-06 MED ORDER — KETAMINE HCL 10 MG/ML IJ SOLN
INTRAMUSCULAR | Status: DC | PRN
  Administered 2019-03-06: 5 mg via INTRAVENOUS
  Administered 2019-03-06: 10 mg via INTRAVENOUS

## 2019-03-06 MED ORDER — EPHEDRINE SULFATE 50 MG/ML IJ SOLN
INTRAMUSCULAR | Status: DC | PRN
  Administered 2019-03-06: 10 mg via INTRAVENOUS

## 2019-03-06 MED ORDER — MIDAZOLAM HCL 2 MG/2ML IJ SOLN
INTRAMUSCULAR | Status: AC
  Filled 2019-03-06: qty 2

## 2019-03-06 NOTE — Telephone Encounter (Signed)
Please see info from RMR below. [facility] Please schedule EUS, and a low dose screening chest CT.   EGD of portal hypertension and esophageal varices. Bulging periampullary area of uncertain significance. Colonoscopy inadequate prep but no gross cancer or big polyp. Rectal varices. Fat droplets in stool.   Please orchestrate initiating a PPI and pancreatic enzymes empirically for chronic pancreatitis. Patient needs to be scheduled for EUS-Dr. Ardis Hughs abnormal pancreaticobiliary tree. Thanks.   I would also recommend back to PCP a low-dose screening chest CT

## 2019-03-06 NOTE — Anesthesia Preprocedure Evaluation (Signed)
Anesthesia Evaluation  Patient identified by MRN, date of birth, ID band Patient awake    Reviewed: Allergy & Precautions, NPO status , Patient's Chart, lab work & pertinent test results  Airway Mallampati: I  TM Distance: >3 FB Neck ROM: Full    Dental no notable dental hx. (+) Edentulous Upper   Pulmonary COPD, Current Smoker and Patient abstained from smoking.,  Reports 2PPD smoker and was previously heavier smoker -COPD by h/o - denies o2 or inhaler use     Pulmonary exam normal breath sounds clear to auscultation       Cardiovascular Exercise Tolerance: Good negative cardio ROS Normal cardiovascular examI Rhythm:Regular Rate:Normal  States can walk miles on a level ground   Neuro/Psych negative neurological ROS  negative psych ROS   GI/Hepatic negative GI ROS, Neg liver ROS, Bowel prep,Here for EGD/Colon for Weight loss/anemia    Endo/Other  negative endocrine ROS  Renal/GU negative Renal ROS  negative genitourinary   Musculoskeletal negative musculoskeletal ROS (+)   Abdominal   Peds negative pediatric ROS (+)  Hematology negative hematology ROS (+) anemia ,   Anesthesia Other Findings   Reproductive/Obstetrics negative OB ROS                             Anesthesia Physical Anesthesia Plan  ASA: III  Anesthesia Plan: General   Post-op Pain Management:    Induction: Intravenous  PONV Risk Score and Plan: 1 and Propofol infusion, TIVA, Treatment may vary due to age or medical condition and Ondansetron  Airway Management Planned: Nasal Cannula and Simple Face Mask  Additional Equipment:   Intra-op Plan:   Post-operative Plan: Extubation in OR  Informed Consent: I have reviewed the patients History and Physical, chart, labs and discussed the procedure including the risks, benefits and alternatives for the proposed anesthesia with the patient or authorized representative  who has indicated his/her understanding and acceptance.     Dental advisory given  Plan Discussed with: CRNA  Anesthesia Plan Comments: (Plan Full PPE use  Plan GA with GETA as needed d/w pt -WTP with same after Q&A)        Anesthesia Quick Evaluation

## 2019-03-06 NOTE — Op Note (Signed)
[facility] Patient Name: Tanner Thomas Procedure Date: 03/06/2019 9:33 AM MRN: [id number] Date of Birth: 10/17/1955 Attending MD: Norvel Richards , MD CSN: [id number] Age: 63 Admit Type: Outpatient Procedure:                Upper GI endoscopy Indications:              Weight loss Providers:                Norvel Richards, MD, Charlsie Quest. Theda Sers RN, RN,                            Aram Candela Referring MD:              Medicines:                Propofol per Anesthesia Complications:            No immediate complications. Estimated Blood Loss:     Estimated blood loss was minimal. Procedure:                Pre-Anesthesia Assessment:                           - Prior to the procedure, a History and Physical                            was performed, and patient medications and                            allergies were reviewed. The patient's tolerance of                            previous anesthesia was also reviewed. The risks                            and benefits of the procedure and the sedation                            options and risks were discussed with the patient.                            All questions were answered, and informed consent                            was obtained. Prior Anticoagulants: The patient has                            taken no previous anticoagulant or antiplatelet                            agents. ASA Grade Assessment: III - A patient with                            severe systemic disease. After reviewing the risks  and benefits, the patient was deemed in                            satisfactory condition to undergo the procedure.                           After obtaining informed consent, the endoscope was                            passed under direct vision. Throughout the                            procedure, the patient's blood pressure, pulse, and                            oxygen saturations were  monitored continuously. The                            [id number] scope was introduced through the                            mouth, and advanced to the second part of duodenum.                            The upper GI endoscopy was accomplished without                            difficulty. The patient tolerated the procedure                            well. Scope In: 10:06:42 AM Scope Out: 10:12:37 AM Total Procedure Duration: 0 hours 5 minutes 55 seconds  Findings:      Grade 1-2 varix -approximately 8 cm in length distal one third of the       esophagus were found in the lower third of the esophagus. Being a lesion       no other lesion seen.      Portal hypertensive gastropathy was found in the gastric body and       fundus. Patchy erythema bastion in the distal stomach. No ulcer or       infiltrating process. No gastric varices seen. Pylorus patent.       Examination of the bulb and second portion revealed an unusually large       bulge and what I believed to be the periampullary region. I could not       identify the ampullary orifice however with the gastroscope. Mucosa       otherwise appeared normal. Biopsies taken. Impression:               - Grade I-II esophageal varices.                           - Portal hypertensive gastropathy. Abnormal gastric                            mucosa?"status post biopsy                           -  Normal duodenal bulb.                           - No specimens collected. Moderate Sedation:      Moderate (conscious) sedation was personally administered by an       anesthesia professional. The following parameters were monitored: oxygen       saturation, heart rate, blood pressure, respiratory rate, EKG, adequacy       of pulmonary ventilation, and response to care. Recommendation:           - Patient has a contact number available for                            emergencies. The signs and symptoms of potential                             delayed complications were discussed with the                            patient. Return to normal activities tomorrow.                            Written discharge instructions were provided to the                            patient.                           - Resume previous diet.                           - Continue present medications. We will consider                            adding pancreatic enzyme supplements empirically                            given CT findings. 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                           - Await pathology results.                           - Refer for an endoscopic ultrasound of the                            abnormal pancreaticobiliary tree in the near                            future. See colonoscopy report. Procedure Code(s):        ---  Professional ---                           [id number], Esophagogastroduodenoscopy, flexible,                            transoral; diagnostic, including collection of                            specimen(s) by brushing or washing, when performed                            (separate procedure) Diagnosis Code(s):        --- Professional ---                           I85.00, Esophageal varices without bleeding                           K76.6, Portal hypertension                           K31.89, Other diseases of stomach and duodenum                           R63.4, Abnormal weight loss CPT copyright 2019 American Medical Association. All rights reserved. The codes documented in this report are preliminary and upon coder review may  be revised to meet current compliance requirements. Cristopher Estimable. Maris Abascal, MD Norvel Richards, MD 03/06/2019 10:54:12 AM This report has been signed electronically. Number of Addenda: 0

## 2019-03-06 NOTE — Telephone Encounter (Signed)
Mansouraty, Telford Nab., MD  Timothy Lasso, RN; Milus Banister, MD        Patient with likely chronic pancreatitis and has biliary ductal dilation and normal LFTs.  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  EGD performed today showed a bulbous region near ampulla but not fully visualized.  Will be decreased sensitivity for finding lesions due to calcifications noted throughout the pancreas but worthwhile.   Needs EGD/EUS Radial/Linear in next few weeks with DJ or myself to evaluate and rule out an ampullary lesion - though none noted on imaging.  Please move forward with scheduling.   Thanks.  GM   FYI DJ.

## 2019-03-06 NOTE — Telephone Encounter (Signed)
Per note below looks like he is recommending PCP order low dose chest CT. Please advise EG. Referral for EUS already sent

## 2019-03-06 NOTE — Anesthesia Postprocedure Evaluation (Signed)
Anesthesia Post Note  Patient: Tanner Thomas  Procedure(s) Performed: COLONOSCOPY WITH PROPOFOL (N/A ) ESOPHAGOGASTRODUODENOSCOPY (EGD) WITH PROPOFOL (N/A ) BIOPSY  Patient location during evaluation: PACU Anesthesia Type: General Level of consciousness: awake and alert and oriented Pain management: pain level controlled Vital Signs Assessment: post-procedure vital signs reviewed and stable Respiratory status: spontaneous breathing Cardiovascular status: blood pressure returned to baseline and stable     Last Vitals:  Vitals:   03/06/19 0903  BP: 125/83  Pulse: 65  Temp: 36.4 C  SpO2: 100%    Last Pain:  Vitals:   03/06/19 1006  TempSrc:   PainSc: 0-No pain                 Gavyn Ybarra

## 2019-03-06 NOTE — Telephone Encounter (Signed)
Referral sent to [place] for EUS via Epic.

## 2019-03-06 NOTE — Discharge Instructions (Signed)
Colonoscopy Discharge Instructions  Read the instructions outlined below and refer to this sheet in the next few weeks. These discharge instructions provide you with general information on caring for yourself after you leave the hospital. Your doctor may also give you specific instructions. While your treatment has been planned according to the most current medical practices available, unavoidable complications occasionally occur. If you have any problems or questions after discharge, call Dr. Gala Romney at [phone]. ACTIVITY  You may resume your regular activity, but move at a slower pace for the next 24 hours.   Take frequent rest periods for the next 24 hours.   Walking will help get rid of the air and reduce the bloated feeling in your belly (abdomen).   No driving for 24 hours (because of the medicine (anesthesia) used during the test).    Do not sign any important legal documents or operate any machinery for 24 hours (because of the anesthesia used during the test).  NUTRITION  Drink plenty of fluids.   You may resume your normal diet as instructed by your doctor.   Begin with a light meal and progress to your normal diet. Heavy or fried foods are harder to digest and may make you feel sick to your stomach (nauseated).   Avoid alcoholic beverages for 24 hours or as instructed.  MEDICATIONS  You may resume your normal medications unless your doctor tells you otherwise.  WHAT YOU CAN EXPECT TODAY  Some feelings of bloating in the abdomen.   Passage of more gas than usual.   Spotting of blood in your stool or on the toilet paper.  IF YOU HAD POLYPS REMOVED DURING THE COLONOSCOPY:  No aspirin products for 7 days or as instructed.   No alcohol for 7 days or as instructed.   Eat a soft diet for the next 24 hours.  FINDING OUT THE RESULTS OF YOUR TEST Not all test results are available during your visit. If your test results are not back during the visit, make an appointment  with your caregiver to find out the results. Do not assume everything is normal if you have not heard from your caregiver or the medical facility. It is important for you to follow up on all of your test results.  SEEK IMMEDIATE MEDICAL ATTENTION IF:  You have more than a spotting of blood in your stool.   Your belly is swollen (abdominal distention).   You are nauseated or vomiting.   You have a temperature over 101.   You have abdominal pain or discomfort that is severe or gets worse throughout the day.    EGD Discharge instructions Please read the instructions outlined below and refer to this sheet in the next few weeks. These discharge instructions provide you with general information on caring for yourself after you leave the hospital. Your doctor may also give you specific instructions. While your treatment has been planned according to the most current medical practices available, unavoidable complications occasionally occur. If you have any problems or questions after discharge, please call your doctor. ACTIVITY  You may resume your regular activity but move at a slower pace for the next 24 hours.   Take frequent rest periods for the next 24 hours.   Walking will help expel (get rid of) the air and reduce the bloated feeling in your abdomen.   No driving for 24 hours (because of the anesthesia (medicine) used during the test).   You may shower.   Do not sign  any important legal documents or operate any machinery for 24 hours (because of the anesthesia used during the test).  NUTRITION  Drink plenty of fluids.   You may resume your normal diet.   Begin with a light meal and progress to your normal diet.   Avoid alcoholic beverages for 24 hours or as instructed by your caregiver.  MEDICATIONS  You may resume your normal medications unless your caregiver tells you otherwise.  WHAT YOU CAN EXPECT TODAY  You may experience abdominal discomfort such as a feeling of  fullness or gas pains.  FOLLOW-UP  Your doctor will discuss the results of your test with you.  SEEK IMMEDIATE MEDICAL ATTENTION IF ANY OF THE FOLLOWING OCCUR:  Excessive nausea (feeling sick to your stomach) and/or vomiting.   Severe abdominal pain and distention (swelling).   Trouble swallowing.   Temperature over 101 F (37.8 C).   Rectal bleeding or vomiting of blood.    Your stomach was inflamed and it was biopsied.  Further recommendations to follow pending review of pathology report  You will likely need further evaluation of your pancreas with a test called endoscopic ultrasound.  My office will schedule.  At patient's request, I called his cousin Jeanine Luz at [phone] and reviewed impression and recommendations    Monitored Anesthesia Care, Care After These instructions provide you with information about caring for yourself after your procedure. Your health care provider may also give you more specific instructions. Your treatment has been planned according to current medical practices, but problems sometimes occur. Call your health care provider if you have any problems or questions after your procedure. What can I expect after the procedure? After your procedure, you may:  Feel sleepy for several hours.  Feel clumsy and have poor balance for several hours.  Feel forgetful about what happened after the procedure.  Have poor judgment for several hours.  Feel nauseous or vomit.  Have a sore throat if you had a breathing tube during the procedure. Follow these instructions at home: For at least 24 hours after the procedure:      Have a responsible adult stay with you. It is important to have someone help care for you until you are awake and alert.  Rest as needed.  Do not: ? Participate in activities in which you could fall or become injured. ? Drive. ? Use heavy machinery. ? Drink alcohol. ? Take sleeping pills or medicines that cause  drowsiness. ? Make important decisions or sign legal documents. ? Take care of children on your own. Eating and drinking  Follow the diet that is recommended by your health care provider.  If you vomit, drink water, juice, or soup when you can drink without vomiting.  Make sure you have little or no nausea before eating solid foods. General instructions  Take over-the-counter and prescription medicines only as told by your health care provider.  If you have sleep apnea, surgery and certain medicines can increase your risk for breathing problems. Follow instructions from your health care provider about wearing your sleep device: ? Anytime you are sleeping, including during daytime naps. ? While taking prescription pain medicines, sleeping medicines, or medicines that make you drowsy.  If you smoke, do not smoke without supervision.  Keep all follow-up visits as told by your health care provider. This is important. Contact a health care provider if:  You keep feeling nauseous or you keep vomiting.  You feel light-headed.  You develop a rash.  You have  a fever. Get help right away if:  You have trouble breathing. Summary  For several hours after your procedure, you may feel sleepy and have poor judgment.  Have a responsible adult stay with you for at least 24 hours or until you are awake and alert. This information is not intended to replace advice given to you by your health care provider. Make sure you discuss any questions you have with your health care provider. Document Released: 08/01/2015 Document Revised: 07/09/2017 Document Reviewed: 08/01/2015 Elsevier Patient Education  2020 [employer].

## 2019-03-06 NOTE — Interval H&P Note (Signed)
History and Physical Interval Note:  03/06/2019 9:42 AM  Tanner Thomas  has presented today for surgery, with the diagnosis of weight loss, anemia.  The various methods of treatment have been discussed with the patient and family. After consideration of risks, benefits and other options for treatment, the patient has consented to  Procedure(s) with comments: COLONOSCOPY WITH PROPOFOL (N/A) - 9:45am - ok at 10:00 per Melanie ESOPHAGOGASTRODUODENOSCOPY (EGD) WITH PROPOFOL (N/A) as a surgical intervention.  The patient's history has been reviewed, patient examined, no change in status, stable for surgery.  I have reviewed the patient's chart and labs.  Questions were answered to the patient's satisfaction.     Manus Rudd  Patient denies dysphagia.  Longstanding history of alcohol and tobacco use.  Here for diagnostic EGD and colonoscopy as outlined.  The risks, benefits, limitations, imponderables and alternatives regarding both EGD and colonoscopy have been reviewed with the patient. Questions have been answered. All parties agreeable.

## 2019-03-06 NOTE — Op Note (Signed)
[facility] Patient Name: Tanner Thomas Procedure Date: 03/06/2019 9:27 AM MRN: [id number] Date of Birth: 1955-07-12 Attending MD: Norvel Richards , MD CSN: [id number] Age: 64 Admit Type: Outpatient Procedure:                Colonoscopy Indications:              Screening for colorectal malignant neoplasm Providers:                Norvel Richards, MD, Charlsie Quest. Theda Sers RN, RN,                            Aram Candela Referring MD:              Medicines:                Propofol per Anesthesia Complications:            No immediate complications. Estimated Blood Loss:     Estimated blood loss: none. Procedure:                Pre-Anesthesia Assessment:                           - Prior to the procedure, a History and Physical                            was performed, and patient medications and                            allergies were reviewed. The patient's tolerance of                            previous anesthesia was also reviewed. The risks                            and benefits of the procedure and the sedation                            options and risks were discussed with the patient.                            All questions were answered, and informed consent                            was obtained. Prior Anticoagulants: The patient has                            taken no previous anticoagulant or antiplatelet                            agents. ASA Grade Assessment: III - A patient with                            severe systemic disease. After reviewing the risks  and benefits, the patient was deemed in                            satisfactory condition to undergo the procedure.                           After obtaining informed consent, the colonoscope                            was passed under direct vision. Throughout the                            procedure, the patient's blood pressure, pulse, and   oxygen saturations were monitored continuously. The                            [id number] scope was introduced through                            the anus and advanced to the the cecum, identified                            by appendiceal orifice and ileocecal valve. The                            colonoscopy was performed without difficulty. The                            patient tolerated the procedure well. The quality                            of the bowel preparation was inadequate. Scope In: 10:19:45 AM Scope Out: 10:34:08 AM Scope Withdrawal Time: 0 hours 8 minutes 50 seconds  Total Procedure Duration: 0 hours 14 minutes 23 seconds  Findings:      The perianal and digital rectal examinations were normal. Patient had a       1 cm wide , Serpentiginous rectal varix extending from the anorectal       junction to the first valve of [place]. Prep was suboptimal throughout       the colon. No gross tumor or larger polyp seen. Distal 3 cm of terminal       ileum appeared normal. Quite abit of bit of vegetable debris and viscous       stool thwarted examination of the entire colonic mucosa. It is notable       there were quite a bit of fat droplets in the effluent. Impression:               - Preparation of the colon was inadequate. Rectal                            varix. Grossly normal-appearing colon (see above)                           - No specimens collected. See EGD report Moderate Sedation:      Moderate (conscious)  sedation was personally administered by an       anesthesia professional. The following parameters were monitored: oxygen       saturation, heart rate, blood pressure, respiratory rate, EKG, adequacy       of pulmonary ventilation, and response to care. Recommendation:           - Patient has a contact number available for                            emergencies. The signs and symptoms of potential                            delayed complications were  discussed with the                            patient. Return to normal activities tomorrow.                            Written discharge instructions were provided to the                            patient.                           - Advance diet as tolerated. We will plan to add                            pancreatic enzymes to his regimen in the near                            future. Patient needs an endoscopic ultrasound to                            further evaluate his pancreatico-biliary tree.                            Offer repeat screening colonoscopy in 1 year in the                            presence of a good prep. Procedure Code(s):        --- Professional ---                           [phone], Colonoscopy, flexible; diagnostic, including                            collection of specimen(s) by brushing or washing,                            when performed (separate procedure) Diagnosis Code(s):        --- Professional ---                           Z12.11, Encounter for screening for malignant  neoplasm of colon CPT copyright 2019 American Medical Association. All rights reserved. The codes documented in this report are preliminary and upon coder review may  be revised to meet current compliance requirements. Cristopher Estimable. Kelina Beauchamp, MD Norvel Richards, MD 03/06/2019 11:01:32 AM This report has been signed electronically. Number of Addenda: 0

## 2019-03-06 NOTE — Progress Notes (Signed)
cc'ed to pcp °

## 2019-03-06 NOTE — Telephone Encounter (Signed)
-----   Message from Daneil Dolin, MD sent at 03/06/2019 11:16 AM EST ----- Regarding: RE: EUS Yes, please ----- Message ----- From: Hassan Rowan, LPN Sent: [id number]  11:04 AM EST To: Daneil Dolin, MD Subject: EUS                                            Is it ok to send referral to Dr. Ardis Hughs at [place] for EUS?

## 2019-03-06 NOTE — Telephone Encounter (Signed)
Left message with [facility] for Soyla Dryer to discuss low dose CT chest. Requested call-back; they will be closed until Monday 03/10/2019.  Cc: ME for FYI. Cc: EG for f/u

## 2019-03-06 NOTE — Transfer of Care (Signed)
Immediate Anesthesia Transfer of Care Note  Patient: Tanner Thomas  Procedure(s) Performed: COLONOSCOPY WITH PROPOFOL (N/A ) ESOPHAGOGASTRODUODENOSCOPY (EGD) WITH PROPOFOL (N/A ) BIOPSY  Patient Location: PACU  Anesthesia Type:General  Level of Consciousness: awake  Airway & Oxygen Therapy: Patient Spontanous Breathing  Post-op Assessment: Report given to RN  Post vital signs: Reviewed  Last Vitals:  Vitals Value Taken Time  BP 102/78 03/06/19 1041  Temp    Pulse 91 03/06/19 1043  Resp 15 03/06/19 1043  SpO2 100 % 03/06/19 1043  Vitals shown include unvalidated device data.  Last Pain:  Vitals:   03/06/19 1006  TempSrc:   PainSc: 0-No pain         Complications: No apparent anesthesia complications

## 2019-03-09 ENCOUNTER — Encounter: Payer: Self-pay | Admitting: Internal Medicine

## 2019-03-10 ENCOUNTER — Other Ambulatory Visit: Payer: Self-pay

## 2019-03-10 DIAGNOSIS — R935 Abnormal findings on diagnostic imaging of other abdominal regions, including retroperitoneum: Secondary | ICD-10-CM

## 2019-03-10 DIAGNOSIS — D649 Anemia, unspecified: Secondary | ICD-10-CM

## 2019-03-10 LAB — SURGICAL PATHOLOGY

## 2019-03-10 NOTE — Telephone Encounter (Signed)
The pt has been scheduled for EUS/EGD on 12/17 at 1145 am at [facility] with Dr Ardis Hughs   Left message on machine to call back

## 2019-03-10 NOTE — Telephone Encounter (Signed)
Placed a call to set up EGD/EUS ampillary lesion with Dr Ardis Hughs on 04/10/19 at 1145 am.  Left message on machine to call back

## 2019-03-11 ENCOUNTER — Ambulatory Visit: Payer: Self-pay | Admitting: Physician Assistant

## 2019-03-11 ENCOUNTER — Telehealth: Payer: Self-pay | Admitting: Nurse Practitioner

## 2019-03-11 ENCOUNTER — Other Ambulatory Visit (HOSPITAL_COMMUNITY)
Admission: RE | Admit: 2019-03-11 | Discharge: 2019-03-11 | Disposition: A | Discharge status: Home / Self Care | Payer: Self-pay | Source: Ambulatory Visit | Attending: Physician Assistant | Admitting: Physician Assistant

## 2019-03-11 ENCOUNTER — Encounter: Payer: Self-pay | Admitting: Physician Assistant

## 2019-03-11 DIAGNOSIS — E46 Unspecified protein-calorie malnutrition: Secondary | ICD-10-CM | POA: Insufficient documentation

## 2019-03-11 DIAGNOSIS — Z125 Encounter for screening for malignant neoplasm of prostate: Secondary | ICD-10-CM | POA: Insufficient documentation

## 2019-03-11 DIAGNOSIS — E871 Hypo-osmolality and hyponatremia: Secondary | ICD-10-CM | POA: Insufficient documentation

## 2019-03-11 DIAGNOSIS — D649 Anemia, unspecified: Secondary | ICD-10-CM | POA: Insufficient documentation

## 2019-03-11 DIAGNOSIS — R634 Abnormal weight loss: Secondary | ICD-10-CM

## 2019-03-11 DIAGNOSIS — Z13220 Encounter for screening for lipoid disorders: Secondary | ICD-10-CM | POA: Insufficient documentation

## 2019-03-11 DIAGNOSIS — R739 Hyperglycemia, unspecified: Secondary | ICD-10-CM | POA: Insufficient documentation

## 2019-03-11 DIAGNOSIS — E876 Hypokalemia: Secondary | ICD-10-CM | POA: Insufficient documentation

## 2019-03-11 DIAGNOSIS — F1010 Alcohol abuse, uncomplicated: Secondary | ICD-10-CM | POA: Insufficient documentation

## 2019-03-11 LAB — CBC WITH DIFFERENTIAL/PLATELET
Abs Immature Granulocytes: 0.01 10*3/uL (ref 0.00–0.07)
Basophils Absolute: 0.1 10*3/uL (ref 0.0–0.1)
Basophils Relative: 1 %
Eosinophils Absolute: 0.2 10*3/uL (ref 0.0–0.5)
Eosinophils Relative: 5 %
HCT: 40.5 % (ref 39.0–52.0)
Hemoglobin: 12.5 g/dL — ABNORMAL LOW (ref 13.0–17.0)
Immature Granulocytes: 0 %
Lymphocytes Relative: 41 %
Lymphs Abs: 2.1 10*3/uL (ref 0.7–4.0)
MCH: 28.8 pg (ref 26.0–34.0)
MCHC: 30.9 g/dL (ref 30.0–36.0)
MCV: 93.3 fL (ref 80.0–100.0)
Monocytes Absolute: 0.5 10*3/uL (ref 0.1–1.0)
Monocytes Relative: 11 %
Neutro Abs: 2.1 10*3/uL (ref 1.7–7.7)
Neutrophils Relative %: 42 %
Platelets: 163 10*3/uL (ref 150–400)
RBC: 4.34 MIL/uL (ref 4.22–5.81)
RDW: 14.6 % (ref 11.5–15.5)
WBC: 5 10*3/uL (ref 4.0–10.5)
nRBC: 0 % (ref 0.0–0.2)

## 2019-03-11 LAB — HEMOGLOBIN A1C
Hgb A1c MFr Bld: 5.8 % — ABNORMAL HIGH (ref 4.8–5.6)
Mean Plasma Glucose: 119.76 mg/dL

## 2019-03-11 LAB — COMPREHENSIVE METABOLIC PANEL
ALT: 20 U/L (ref 0–44)
AST: 25 U/L (ref 15–41)
Albumin: 3.9 g/dL (ref 3.5–5.0)
Alkaline Phosphatase: 99 U/L (ref 38–126)
Anion gap: 10 (ref 5–15)
BUN: 9 mg/dL (ref 8–23)
CO2: 26 mmol/L (ref 22–32)
Calcium: 9.4 mg/dL (ref 8.9–10.3)
Chloride: 102 mmol/L (ref 98–111)
Creatinine, Ser: 0.76 mg/dL (ref 0.61–1.24)
GFR calc Af Amer: >60 mL/min (ref >60)
GFR calc non Af Amer: >60 mL/min (ref >60)
Glucose, Bld: 111 mg/dL — ABNORMAL HIGH (ref 70–99)
Potassium: 4.1 mmol/L (ref 3.5–5.1)
Sodium: 138 mmol/L (ref 135–145)
Total Bilirubin: 0.7 mg/dL (ref 0.3–1.2)
Total Protein: 8.2 g/dL — ABNORMAL HIGH (ref 6.5–8.1)

## 2019-03-11 LAB — LIPID PANEL
Cholesterol: 138 mg/dL (ref 0–200)
HDL: 44 mg/dL (ref >40)
LDL Cholesterol: 76 mg/dL (ref 0–99)
Total CHOL/HDL Ratio: 3.1 RATIO
Triglycerides: 88 mg/dL (ref <150)
VLDL: 18 mg/dL (ref 0–40)

## 2019-03-11 LAB — PSA: Prostatic Specific Antigen: 3.07 ng/mL (ref 0.00–4.00)

## 2019-03-11 MED ORDER — OMEPRAZOLE 40 MG PO CPDR: 40.0000 mg | DELAYED_RELEASE_CAPSULE | Freq: Every day | ORAL | 3 refills | Status: DC

## 2019-03-11 NOTE — Telephone Encounter (Signed)
Noted will try to reach pt. Samples are ready for pick up.

## 2019-03-11 NOTE — Telephone Encounter (Signed)
Tried to call the patient to start him on PPI (omeprazole 40mg  daily) and Creon (36,000 units/meal; 12,000 units per snack). He will likely need PA or patient assistance.  We can have him pick up Creon samples 36,000 units x 3 boxes to start: take 1 pill with meals, 1 pill with snacks.  I tried to call but VM if full.

## 2019-03-11 NOTE — Progress Notes (Signed)
There were no vitals taken for this visit.   Subjective:    Patient ID: Tanner Thomas, male    DOB: August 10, 1955, 63 y.o.   MRN: [id number]  HPI: Tanner Thomas is a 63 y.o. male presenting on 03/11/2019 for No chief complaint on file.   HPI  This is a telemedicine appointment due to coronavirus pandemic.  It is via telephone as patient does not have a video and it video enabled device.  I connected with  [name] on 03/11/19 by a video enabled telemedicine application and verified that I am speaking with the correct person using two identifiers.   I discussed the limitations of evaluation and management by telemedicine. The patient expressed understanding and agreed to proceed.   Patient is sitting in his car.  Provider is at office.   Patient is a 63 year old male who just came to this clinic in mid October to establish patient care patient has had weight loss of about 25 pounds.  Patient does have history of EtOH.  Patient has also been a longtime smoker but says that he quit smoking a couple weeks ago.  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  When asked about his eating habits, he says He is stlll only eating once daily. - says he isn't hungry.    Patient had chest x-ray in September which was unremarkable.  Patient has been evaluated by GI.  He has undergone colonoscopy which was essentially normal.  He is scheduled for EGD in December.  He Denies depression  Pt hasn't done lab work ordered at initial [facility] on 02/06/19  Pt says he is feeling okay today.     Relevant past medical, surgical, family and social history reviewed and updated as indicated. Interim medical history since our last visit reviewed. Allergies and medications reviewed and updated.   No current outpatient medications on file.    Review of Systems  Per HPI unless specifically indicated above     Objective:    There were no vitals  taken for this visit.  Wt Readings from Last 3 Encounters:  03/05/19 131 lb 13.4 oz (59.8 kg)  03/05/19 131 lb 12.8 oz (59.8 kg)  02/06/19 133 lb 6.4 oz (60.5 kg)    Physical Exam Constitutional:      General: He is not in acute distress.    Appearance: He is underweight.  HENT:     Head: Normocephalic and atraumatic.  Pulmonary:     Effort: Pulmonary effort is normal. No respiratory distress.  Neurological:     Mental Status: He is alert and oriented to person, place, and time.  Psychiatric:        Attention and Perception: Attention normal.        Speech: Speech normal.        Behavior: Behavior is cooperative.       Assessment & Plan:   Encounter Diagnosis  Name Primary?  . Weight loss Yes       Patient is encouraged to get fasting labs drawn.  Discussed that we need to check his lipids and PSA among other things.  Patient is given some samples of nutritional supplement similar to boost.  Encourage patient that he needs to eat 3 times a day for to help increase his weight  Patient is to follow-up with gastroenterology per their recommendation.  Patient will follow up for review of labs and recheck after his EGD.  Patient is to  contact office sooner as needed.  Patient is congratulated on stopping smoking.  He is encouraged to continue to avoid smoking for overall better health.

## 2019-03-12 ENCOUNTER — Telehealth: Payer: Self-pay | Admitting: Nurse Practitioner

## 2019-03-12 NOTE — Telephone Encounter (Signed)
Pt called back.  Informed him that EG recommends to start him on PPI (omeprazole 40mg  daily) and Creon (36,000 units/meal; 12,000 units per snack).  Informed him that the medication may need PA or patient assistance. Pt was also advised the we have Creon samples 36,000 units x 3 boxes up front for him to pick up and  to start.  Pt advised to  take 1 pill with meals and 1 pill with snacks.  Pt voiced understanding.

## 2019-03-12 NOTE — Telephone Encounter (Signed)
Noted  

## 2019-03-12 NOTE — Telephone Encounter (Signed)
Lmom, waiting on a return call.  

## 2019-03-12 NOTE — Telephone Encounter (Signed)
Called and spoke with the nurse at the [facility]. Discussed recommendation for low dose CT chest as recommended by Dr. Gala Romney. She states she will pass the message on to the PA to have that ordered.

## 2019-03-13 ENCOUNTER — Other Ambulatory Visit: Payer: Self-pay | Admitting: Physician Assistant

## 2019-03-13 DIAGNOSIS — F17200 Nicotine dependence, unspecified, uncomplicated: Secondary | ICD-10-CM

## 2019-03-13 DIAGNOSIS — R634 Abnormal weight loss: Secondary | ICD-10-CM

## 2019-03-17 NOTE — Telephone Encounter (Signed)
Left message on machine to call back unable to reach the pt via phone.  I have mailed all the information to the pt home

## 2019-03-24 ENCOUNTER — Encounter: Payer: Self-pay | Admitting: Student

## 2019-04-07 ENCOUNTER — Other Ambulatory Visit (HOSPITAL_COMMUNITY): Admission: RE | Admit: 2019-04-07 | Payer: Self-pay | Source: Ambulatory Visit

## 2019-04-09 ENCOUNTER — Telehealth: Payer: Self-pay

## 2019-04-09 ENCOUNTER — Other Ambulatory Visit: Payer: Self-pay | Admitting: Physician Assistant

## 2019-04-09 ENCOUNTER — Telehealth: Payer: Self-pay | Admitting: Internal Medicine

## 2019-04-09 DIAGNOSIS — R634 Abnormal weight loss: Secondary | ICD-10-CM

## 2019-04-09 NOTE — Telephone Encounter (Signed)
Linna Hoff,  Sorry about the last minute cancellation.  Very frustrating.  Appointment slots are a precious commodity these days.  Happy Holidays to you and your family!  Ronalee Belts

## 2019-04-09 NOTE — Telephone Encounter (Signed)
Spoke with pt. His next ov is 05/2018. Pt was notified that he will come in to his February apt to discuss rescheduling his appointment.

## 2019-04-09 NOTE — Telephone Encounter (Signed)
This is disappointing, especially on such short notice for these very limited space procedures.  He will need to reestablish with his primary gastroenterologist before we can schedule him again for this type of an appointment.    I will forward this to Dr. Gala Romney and Walden Field, NP

## 2019-04-09 NOTE — Telephone Encounter (Signed)
The pt was contacted because he has not had his COVID testing for the EUS tomorrow.  He states he does not want to have the procedure at this time and will call back to schedule

## 2019-04-09 NOTE — Telephone Encounter (Signed)
Pt said he was returning a call from this morning. I did not see a phone note of anyone calling. ME said she didn't call. Please call [phone]

## 2019-04-09 NOTE — Telephone Encounter (Signed)
Thanks for the heads up and sorry about the last minute cancellation! We'll follow-up with him in the office (currently scheduled for 06/05/19)

## 2019-04-10 ENCOUNTER — Ambulatory Visit (HOSPITAL_COMMUNITY): Admit: 2019-04-10 | Payer: Self-pay | Admitting: Gastroenterology

## 2019-04-10 ENCOUNTER — Encounter (HOSPITAL_COMMUNITY): Payer: Self-pay

## 2019-04-10 SURGERY — UPPER ENDOSCOPIC ULTRASOUND (EUS) RADIAL
Anesthesia: Monitor Anesthesia Care

## 2019-04-28 ENCOUNTER — Telehealth: Payer: Self-pay | Admitting: Internal Medicine

## 2019-04-28 ENCOUNTER — Ambulatory Visit: Payer: Self-pay | Admitting: Physician Assistant

## 2019-04-28 ENCOUNTER — Encounter: Payer: Self-pay | Admitting: Physician Assistant

## 2019-04-28 DIAGNOSIS — F17200 Nicotine dependence, unspecified, uncomplicated: Secondary | ICD-10-CM

## 2019-04-28 DIAGNOSIS — R634 Abnormal weight loss: Secondary | ICD-10-CM

## 2019-04-28 DIAGNOSIS — D649 Anemia, unspecified: Secondary | ICD-10-CM

## 2019-04-28 NOTE — Telephone Encounter (Signed)
Spoke with Oris Drone at the [facility]. They have samples of Creon and wanted to know the instructions to provide pt with a sample. Pt was advised to take 36, 000 units 1 pill with meals and 1 pill with snacks per EG.   PA info will also be submitted to the clinic for continuation of Creon.

## 2019-04-28 NOTE — Telephone Encounter (Signed)
[phone] EXT [place] HAS QUESTIONS ABOUT THIS PATIENT MEDS.  HE IS OUT OF CREAON AND THEY HAVE SAMPLES AND HAVE A QUESTION

## 2019-04-28 NOTE — Progress Notes (Signed)
There were no vitals taken for this visit.   Subjective:    Patient ID: Tanner Thomas, male    DOB: 1955/10/23, 64 y.o.   MRN: [id number]  HPI: Tanner Thomas is a 64 y.o. male presenting on 04/28/2019 for No chief complaint on file.   HPI   This is a telemedicine appointment due to coronavirus pandemic.  It is via telephone as pt does not have a video enabled device.   I connected with  Tanner Thomas on 04/28/19 by a video enabled telemedicine application and verified that I am speaking with the correct person using two identifiers.   I discussed the limitations of evaluation and management by telemedicine. The patient expressed understanding and agreed to proceed.  Pt is at home.  Provider is at office.    Pt is 64yo male with recent weight loss.   He has been evaluated by GI.  He was given medications by GI but has run out of them.   He was referred to a specialist GI provider for some test but apparently it didn't get done because he was confused and didn't know he needed to get a covid test prior to the procedure.     He thinks his weight is going up.  He doesn't have scales at home so he doesn't check his weight.  He says his Energy is good.   He says he is still Smoking some; he says he is smoking 1 or 2 /week  He is scheduled for a chest CT next week.       Relevant past medical, surgical, family and social history reviewed and updated as indicated. Interim medical history since our last visit reviewed. Allergies and medications reviewed and updated.  CURRENT MEDS: none   Current Outpatient Medications:  .  lipase/protease/amylase (CREON) 36000 UNITS CPEP capsule, Take 36,000 Units by mouth 3 (three) times daily with meals., Disp: , Rfl:  .  omeprazole (PRILOSEC) 40 MG capsule, Take 1 capsule (40 mg total) by mouth daily. (Patient not taking: Reported on 04/01/2019), Disp: 90 capsule, Rfl: 3   Review of Systems  Per HPI unless specifically indicated above      Objective:    There were no vitals taken for this visit.  Wt Readings from Last 3 Encounters:  03/05/19 131 lb 13.4 oz (59.8 kg)  03/05/19 131 lb 12.8 oz (59.8 kg)  02/06/19 133 lb 6.4 oz (60.5 kg)    Physical Exam Pulmonary:     Effort: Pulmonary effort is normal. No respiratory distress.  Neurological:     Mental Status: He is alert and oriented to person, place, and time.  Psychiatric:        Attention and Perception: Attention normal.        Speech: Speech normal.        Behavior: Behavior is cooperative.     Results for orders placed or performed during the hospital encounter of 03/11/19  Lipid panel  Result Value Ref Range   Cholesterol 138 0 - 200 mg/dL   Triglycerides 88 <150 mg/dL   HDL 44 >40 mg/dL   Total CHOL/HDL Ratio 3.1 RATIO   VLDL 18 0 - 40 mg/dL   LDL Cholesterol 76 0 - 99 mg/dL  Comprehensive metabolic panel  Result Value Ref Range   Sodium 138 135 - 145 mmol/L   Potassium 4.1 3.5 - 5.1 mmol/L   Chloride 102 98 - 111 mmol/L   CO2 26 22 - 32 mmol/L   Glucose,  Bld 111 (H) 70 - 99 mg/dL   BUN 9 8 - 23 mg/dL   Creatinine, Ser 0.76 0.61 - 1.24 mg/dL   Calcium 9.4 8.9 - 10.3 mg/dL   Total Protein 8.2 (H) 6.5 - 8.1 g/dL   Albumin 3.9 3.5 - 5.0 g/dL   AST 25 15 - 41 U/L   ALT 20 0 - 44 U/L   Alkaline Phosphatase 99 38 - 126 U/L   Total Bilirubin 0.7 0.3 - 1.2 mg/dL   GFR calc non Af Amer >60 >60 mL/min   GFR calc Af Amer >60 >60 mL/min   Anion gap 10 5 - 15  CBC w/Diff/Platelet  Result Value Ref Range   WBC 5.0 4.0 - 10.5 K/uL   RBC 4.34 4.22 - 5.81 MIL/uL   Hemoglobin 12.5 (L) 13.0 - 17.0 g/dL   HCT 40.5 39.0 - 52.0 %   MCV 93.3 80.0 - 100.0 fL   MCH 28.8 26.0 - 34.0 pg   MCHC 30.9 30.0 - 36.0 g/dL   RDW 14.6 11.5 - 15.5 %   Platelets 163 150 - 400 K/uL   nRBC 0.0 0.0 - 0.2 %   Neutrophils Relative % 42 %   Neutro Abs 2.1 1.7 - 7.7 K/uL   Lymphocytes Relative 41 %   Lymphs Abs 2.1 0.7 - 4.0 K/uL   Monocytes Relative 11 %   Monocytes  Absolute 0.5 0.1 - 1.0 K/uL   Eosinophils Relative 5 %   Eosinophils Absolute 0.2 0.0 - 0.5 K/uL   Basophils Relative 1 %   Basophils Absolute 0.1 0.0 - 0.1 K/uL   Immature Granulocytes 0 %   Abs Immature Granulocytes 0.01 0.00 - 0.07 K/uL  PSA  Result Value Ref Range   Prostatic Specific Antigen 3.07 0.00 - 4.00 ng/mL  Hemoglobin A1c  Result Value Ref Range   Hgb A1c MFr Bld 5.8 (H) 4.8 - 5.6 %   Mean Plasma Glucose 119.76 mg/dL      Assessment & Plan:    Encounter Diagnoses  Name Primary?  . Weight loss Yes  . Tobacco use disorder   . Anemia, unspecified type      -reviewed labs with pt -encouraged pt to continue to eat healthy meals and eat regularly -encouraged smoking cessation -CT next week as scheduled -pt to follow up with GI in February as scheduled -pt to follow up here 6 weeks.  He is to contact office sooner prn

## 2019-04-30 ENCOUNTER — Other Ambulatory Visit: Payer: Self-pay

## 2019-04-30 ENCOUNTER — Other Ambulatory Visit (HOSPITAL_COMMUNITY)
Admission: RE | Admit: 2019-04-30 | Discharge: 2019-04-30 | Disposition: A | Discharge status: Home / Self Care | Payer: Self-pay | Source: Ambulatory Visit | Attending: Physician Assistant | Admitting: Physician Assistant

## 2019-04-30 ENCOUNTER — Ambulatory Visit (HOSPITAL_COMMUNITY)
Admission: RE | Admit: 2019-04-30 | Discharge: 2019-04-30 | Disposition: A | Discharge status: Home / Self Care | Payer: Self-pay | Source: Ambulatory Visit | Attending: Physician Assistant | Admitting: Physician Assistant

## 2019-04-30 DIAGNOSIS — R634 Abnormal weight loss: Secondary | ICD-10-CM | POA: Insufficient documentation

## 2019-04-30 DIAGNOSIS — R69 Illness, unspecified: Secondary | ICD-10-CM | POA: Insufficient documentation

## 2019-04-30 LAB — POCT I-STAT CREATININE: Creatinine, Ser: 0.7 mg/dL (ref 0.61–1.24)

## 2019-04-30 MED ORDER — IOHEXOL 300 MG/ML  SOLN
75.0000 mL | Freq: Once | INTRAMUSCULAR | Status: AC | PRN
  Administered 2019-04-30: 75 mL via INTRAVENOUS

## 2019-06-05 ENCOUNTER — Encounter: Payer: Self-pay | Admitting: Nurse Practitioner

## 2019-06-05 ENCOUNTER — Other Ambulatory Visit: Payer: Self-pay

## 2019-06-05 ENCOUNTER — Ambulatory Visit (INDEPENDENT_AMBULATORY_CARE_PROVIDER_SITE_OTHER): Payer: Self-pay | Admitting: Nurse Practitioner

## 2019-06-05 ENCOUNTER — Encounter: Payer: Self-pay | Admitting: Internal Medicine

## 2019-06-05 VITALS — BP 131/92 | HR 87 | Temp 97.1°F | Ht 70.0 in | Wt 139.2 lb

## 2019-06-05 DIAGNOSIS — D649 Anemia, unspecified: Secondary | ICD-10-CM

## 2019-06-05 DIAGNOSIS — K862 Cyst of pancreas: Secondary | ICD-10-CM

## 2019-06-05 DIAGNOSIS — R935 Abnormal findings on diagnostic imaging of other abdominal regions, including retroperitoneum: Secondary | ICD-10-CM

## 2019-06-05 DIAGNOSIS — R634 Abnormal weight loss: Secondary | ICD-10-CM

## 2019-06-05 NOTE — Progress Notes (Signed)
Referring Provider: Soyla Dryer, PA-C Primary Care Physician:  Soyla Dryer, PA-C Primary GI:  Dr. Gala Romney  Chief Complaint  Patient presents with  . Weight Loss    anemia    HPI:   Tanner Thomas is a 64 y.o. male who presents for follow-up on weight loss and anemia.  The patient was last seen in our office 03/05/2019 for weight loss, anemia, abnormal CT of the abdomen.  Noted history of chronic alcohol abuse.  25 pound weight loss over several months with good oral intake.  No previous colonoscopy.  Pancreatic procedure/surgery although he is unsure of what it was.  Recently relocated to the area from [place].  Recommended reduction in alcohol intake with no more than 7 drinks a week.  Previous abdominal imaging 01/16/2019 with pancreatic appearance of chronic pancreatitis but no mass identified.  A new rim-enhancing fluid collection adjacent to the pancreatic tail measuring 2.2 x 1.2 cm consistent with a small pancreatic pseudocyst.  Also noted diffuse biliary ductal dilation measuring 13 mm in diameter to the level of the pancreatic head.  Recent drop in hemoglobin from 10.1-8.2, LFTs during hospitalization for acute pyelonephritis found no elevated LFTs despite biliary ductal dilation and obstructed CBD.  At his last visit he states he has never been told he is anemic prior to his hospitalization.  No hematochezia or melena noted.  Ongoing weight loss of 30 pounds in 3 to 6 months subjectively.  Objectively it is hard to quantify due to limited epic history but is at least down to 3 pounds in the past 3 weeks.  Abdominal pain left lower quadrant that is crampy despite regular bowel movements.  His stools are sometimes hard depending on oral intake.  Previously drank 12-13 drinks a week but currently down to 3-4 a week.  Recommended labs, request records from [place], colonoscopy and endoscopy, follow-up in 3 months.  Labs completed 03/05/2019 include CBC which found  hemoglobin of 11.9, significantly improved from previous draw.  CMP essentially normal.  Lipase, iron, ferritin all normal.  Colonoscopy completed 03/06/2019 which found inadequate bowel prep, rectal varix, grossly normal-appearing colon.  Recommended repeat colonoscopy in 1 year with good prep, EUS to further evaluate pancreaticobiliary tree, and pancreatic enzymes.  EGD the same day found grade 1-2 esophageal varices, portal hypertensive gastropathy status post biopsy, normal duodenum.  Surgical pathology found the biopsies to be mild chronic gastritis with focal noncaseating granuloma, negative for H. pylori, intestinal metaplasia, dysplasia, or malignancy.  Overall recommended low-dose CT chest, EUS (due to bulging periampullary area of uncertain significance).  Pancreatic enzymes due to fat droplets in the stool.  The patient was scheduled for EUS/EGD on 04/10/2019 and a voicemail was left for the patient.  However, his EUS was canceled because he did not have Covid testing.  He states he does not want to have the procedure at this time and will call back to reschedule.  Phone note received in January of this year from the free clinic indicating he has samples of Creon and will work on a prior authorization for continuation of Creon.  Today he states he's doing well. Not sure what his weight has been doing subjectively. Objectively he is up 8-9 lbs on Creon. He cancelled his EUS due to transportation issues. Anemia improved. Denies hematochezia, melena, any other bleeding. Denies abdominal pain, N/V, fever, chills, unintentional weight loss. Denies URI or flu-like symptoms. Denies loss of sense of taste or smell. Has been tested for COVID-19  two months ago which was negative. Want a COVID-19 vaccine, though he's not eligible yet. Denies chest pain, dyspnea, dizziness, lightheadedness, syncope, near syncope. Denies any other upper or lower GI symptoms.  Past Medical History:  Diagnosis Date  .  Known health problems: none     Past Surgical History:  Procedure Laterality Date  . BIOPSY  03/06/2019   Procedure: BIOPSY;  Surgeon: Daneil Dolin, MD;  [facility];  Service: Endoscopy;;  gastric  . COLONOSCOPY WITH PROPOFOL N/A 03/06/2019   Procedure: COLONOSCOPY WITH PROPOFOL;  Surgeon: Daneil Dolin, MD;  Location: [facility];  Service: Endoscopy;  Laterality: N/A;  9:45am - ok at 10:00 per [facility]  . ESOPHAGOGASTRODUODENOSCOPY (EGD) WITH PROPOFOL N/A 03/06/2019   Procedure: ESOPHAGOGASTRODUODENOSCOPY (EGD) WITH PROPOFOL;  Surgeon: Daneil Dolin, MD;  Location: [facility];  Service: Endoscopy;  Laterality: N/A;  . PANCREAS SURGERY      Current Outpatient Medications  Medication Sig Dispense Refill  . lipase/protease/amylase (CREON) 36000 UNITS CPEP capsule Take 36,000 Units by mouth 3 (three) times daily with meals.    Marland Kitchen omeprazole (PRILOSEC) 40 MG capsule Take 1 capsule (40 mg total) by mouth daily. (Patient not taking: Reported on 04/01/2019) 90 capsule 3   No current facility-administered medications for this visit.    Allergies as of 06/05/2019  . (No Known Allergies)    Family History  Problem Relation Age of Onset  . Heart disease Father   . Colon cancer Neg Hx   . Gastric cancer Neg Hx   . Esophageal cancer Neg Hx     Social History   Socioeconomic History  . Marital status: Single    Spouse name: Not on file  . Number of children: Not on file  . Years of education: Not on file  . Highest education level: Not on file  Occupational History  . Not on file  Tobacco Use  . Smoking status: Current Every Day Smoker    Packs/day: 0.25    Years: 50.00    Pack years: 12.50    Types: Cigarettes    Start date: 05/24/1967  . Smokeless tobacco: Never Used  Substance and Sexual Activity  . Alcohol use: Not Currently    Alcohol/week: 13.0 standard drinks    Types: 13 Cans of beer per week    Comment: 03/04/19 states 3-4 a week; previously:  as of 02-06-19 drinks about 12-13 beers weekly  . Drug use: Not Currently    Types: Marijuana    Comment: pt denies but + UDS 10/20  . Sexual activity: Yes  Other Topics Concern  . Not on file  Social History Narrative  . Not on file   Social Determinants of Health   Financial Resource Strain:   . Difficulty of Paying Living Expenses: Not on file  Food Insecurity:   . Worried About Charity fundraiser in the Last Year: Not on file  . Ran Out of Food in the Last Year: Not on file  Transportation Needs:   . Lack of Transportation (Medical): Not on file  . Lack of Transportation (Non-Medical): Not on file  Physical Activity:   . Days of Exercise per Week: Not on file  . Minutes of Exercise per Session: Not on file  Stress:   . Feeling of Stress : Not on file  Social Connections:   . Frequency of Communication with Friends and Family: Not on file  . Frequency of Social Gatherings with Friends and  Family: Not on file  . Attends Religious Services: Not on file  . Active Member of Clubs or Organizations: Not on file  . Attends Archivist Meetings: Not on file  . Marital Status: Not on file    Review of Systems: General: Negative for anorexia, weight loss, fever, chills, fatigue, weakness. ENT: Negative for hoarseness, difficulty swallowing. CV: Negative for chest pain, angina, palpitations, peripheral edema.  Respiratory: Negative for dyspnea at rest, cough, sputum, wheezing.  GI: See history of present illness. Endo: Negative for unusual weight change.  Heme: Negative for bruising or bleeding. Allergy: Negative for rash or hives.   Physical Exam: BP (!) 131/92   Pulse 87   Temp (!) 97.1 F (36.2 C) (Temporal)   Ht 5\' 10"  (1.778 m)   Wt 139 lb 3.2 oz (63.1 kg)   BMI 19.97 kg/m  General:   Alert and oriented. Pleasant and cooperative. Well-nourished and well-developed.  Eyes:  Without icterus, sclera clear and conjunctiva pink.  Ears:  Normal auditory  acuity. Cardiovascular:  S1, S2 present without murmurs appreciated. Extremities without clubbing or edema. Respiratory:  Clear to auscultation bilaterally. No wheezes, rales, or rhonchi. No distress.  Gastrointestinal:  +BS, soft, non-tender and non-distended. No HSM noted. No guarding or rebound. No masses appreciated.  Rectal:  Deferred  Musculoskalatal:  Symmetrical without gross deformities. Neurologic:  Alert and oriented x4;  grossly normal neurologically. Psych:  Alert and cooperative. Normal mood and affect. Heme/Lymph/Immune: No excessive bruising noted.    06/05/2019 1:41 PM   Disclaimer: This note was dictated with voice recognition software. Similar sounding words can inadvertently be transcribed and may not be corrected upon review.

## 2019-06-05 NOTE — Assessment & Plan Note (Signed)
Weight loss felt likely at least partially due to pancreatic insufficiency and chronic pancreatitis given that on his colonoscopy there were fat droplets in his colon.  Recommended addition of Creon which she has been taking.  We are working with the free clinic to get a prior Auth approved for coverage.  I will give him a few more boxes of samples as he only has 1 box left at home.  We will contact the free clinic to assess his status.  Since starting Creon his weight has improved, he has put on about 8 or 9 pounds.  He feels good.  Recommend he continue Creon as previously recommended and follow-up in 6 months.

## 2019-06-05 NOTE — Assessment & Plan Note (Signed)
Abnormal CT of the abdomen finding consistent with chronic pancreatitis and pseudocyst.  Also noted diffuse biliary ductal dilation 13 mm in diameter to the level of the pancreatic head.  It was previously recommended he have an EUS completed, however he had to cancel it the day before due to no transportation.  I have asked him to try to find transportation and let us know when he does so we can schedule the appointment.  I emphasized the need to try to keep these appointments as procedure appointments are very hard to come by.  As an aside, follow-up low-dose CT of the chest found again pancreaticobiliary abnormalities and recommended MRI/MRCP.  I will put in this order for now to have this completed.

## 2019-06-05 NOTE — Progress Notes (Signed)
CC'ED TO PCP 

## 2019-06-05 NOTE — Assessment & Plan Note (Signed)
Anemia appears stable but his CBC has not been checked in a few months.  I will check a CBC today as well as BMP and HFP due to his pancreatic and biliary abnormalities.  Follow-up in 6 months.

## 2019-06-05 NOTE — Patient Instructions (Signed)
Your health issues we discussed today were:   Anemia: 1. Have your blood test completed when you are able to 2. Call us if you notice any obvious bleeding 3. Call us if you have any shortness of breath, chest pain, dizziness, lightheadedness, passing out  Abnormal pancreas and bile drainage ducts: 1. I have put in an order for an MRI.we will check the status of your bile duct drainage system in your pancreas 2. It is important that you have that endoscopic ultrasound completed in [place]. 3. Let us know when you are able to find someone who can bring you so we can work on getting that rescheduled 4. As discussed, it is important to keep appointments for procedures as it can be very difficult to get a procedure slot/appointment 5. Call us if you have any worsening symptoms especially any yellowing of your eyes, real dark urine (dark brown)  Weight loss: 1. Continue taking Creon as you have been 2. I am giving you a few more boxes of samples 3. We will call the preclinic and find out the status of your prescription for Creon 4. Call us if you have any worsening weight loss  Overall I recommend:  1. Continue your other current medications 2. Return for follow-up in 6 months 3. Call us if you have any questions or concerns.   ---------------------------------------------------------------  COVID-19 Vaccine Information can be found at: [email or web address].uk For questions related to vaccine distribution or appointments, please email [email or web address] or call [phone].   ---------------------------------------------------------------   At [facility] we value your feedback. You may receive a survey about your visit today. Please share your experience as we strive to create trusting relationships with our patients to provide genuine, compassionate, quality care.  We appreciate your understanding and  patience as we review any laboratory studies, imaging, and other diagnostic tests that are ordered as we care for you. Our office policy is 5 business days for review of these results, and any emergent or urgent results are addressed in a timely manner for your best interest. If you do not hear from our office in 1 week, please contact us.   We also encourage the use of MyChart, which contains your medical information for your review as well. If you are not enrolled in this feature, an access code is on this after visit summary for your convenience. Thank you for allowing [place] to be involved in your care.  It was great to see you today!  I hope you have a great day!!

## 2019-06-17 ENCOUNTER — Ambulatory Visit: Payer: Self-pay | Admitting: Physician Assistant

## 2019-06-20 ENCOUNTER — Other Ambulatory Visit: Payer: Self-pay | Admitting: Nurse Practitioner

## 2019-06-20 ENCOUNTER — Other Ambulatory Visit: Payer: Self-pay

## 2019-06-20 ENCOUNTER — Ambulatory Visit (HOSPITAL_COMMUNITY)
Admission: RE | Admit: 2019-06-20 | Discharge: 2019-06-20 | Disposition: A | Discharge status: Home / Self Care | Payer: Self-pay | Source: Ambulatory Visit | Attending: Nurse Practitioner | Admitting: Nurse Practitioner

## 2019-06-20 DIAGNOSIS — R634 Abnormal weight loss: Secondary | ICD-10-CM

## 2019-06-20 DIAGNOSIS — D649 Anemia, unspecified: Secondary | ICD-10-CM | POA: Insufficient documentation

## 2019-06-20 DIAGNOSIS — K862 Cyst of pancreas: Secondary | ICD-10-CM

## 2019-06-20 DIAGNOSIS — R935 Abnormal findings on diagnostic imaging of other abdominal regions, including retroperitoneum: Secondary | ICD-10-CM | POA: Insufficient documentation

## 2019-06-20 LAB — POCT I-STAT CREATININE: Creatinine, Ser: 0.9 mg/dL (ref 0.61–1.24)

## 2019-06-20 MED ORDER — GADOBUTROL 1 MMOL/ML IV SOLN
7.0000 mL | Freq: Once | INTRAVENOUS | Status: AC | PRN
  Administered 2019-06-20: 7 mL via INTRAVENOUS

## 2019-07-02 ENCOUNTER — Encounter: Payer: Self-pay | Admitting: Physician Assistant

## 2019-07-02 ENCOUNTER — Ambulatory Visit: Payer: Self-pay | Admitting: Physician Assistant

## 2019-07-02 ENCOUNTER — Other Ambulatory Visit: Payer: Self-pay

## 2019-07-02 VITALS — BP 118/84 | HR 91 | Temp 98.2°F | Wt 138.2 lb

## 2019-07-02 DIAGNOSIS — M1711 Unilateral primary osteoarthritis, right knee: Secondary | ICD-10-CM

## 2019-07-02 DIAGNOSIS — F17200 Nicotine dependence, unspecified, uncomplicated: Secondary | ICD-10-CM

## 2019-07-02 DIAGNOSIS — R911 Solitary pulmonary nodule: Secondary | ICD-10-CM

## 2019-07-02 DIAGNOSIS — E46 Unspecified protein-calorie malnutrition: Secondary | ICD-10-CM

## 2019-07-02 DIAGNOSIS — IMO0001 Reserved for inherently not codable concepts without codable children: Secondary | ICD-10-CM

## 2019-07-02 DIAGNOSIS — F1011 Alcohol abuse, in remission: Secondary | ICD-10-CM

## 2019-07-02 NOTE — Progress Notes (Signed)
BP 118/84   Pulse 91   Temp 98.2 F (36.8 C)   Wt 138 lb 3.2 oz (62.7 kg)   SpO2 99%   BMI 19.83 kg/m    Subjective:    Patient ID: Tanner Thomas, male    DOB: [birth year], 64 y.o.   MRN: [id number]  HPI: Tanner Thomas is a 64 y.o. male presenting on 07/02/2019 for Follow-up   HPI  Pt had negative covid 19 screening questionnaire.   Pt is 63yoM with weight loss.  He is Still smoking.  He Says he is drinking only 1- 24 ounce beer/week which is a significant reduction.  He says he isn't drinking at all the other 6 days/wk.  He is eating better since he stopped drinking so much.  CT chest- from January reviewed with pt- recomends repeat scan in 12 mo  Pt c/o R knee pain.  This has been a problem for years.  He was seen in ER for this on 12/23/18.     Relevant past medical, surgical, family and social history reviewed and updated as indicated. Interim medical history since our last visit reviewed. Allergies and medications reviewed and updated.   Current Outpatient Medications:  .  lipase/protease/amylase (CREON) 36000 UNITS CPEP capsule, Take 36,000 Units by mouth 3 (three) times daily with meals., Disp: , Rfl:     Review of Systems  Per HPI unless specifically indicated above     Objective:    BP 118/84   Pulse 91   Temp 98.2 F (36.8 C)   Wt 138 lb 3.2 oz (62.7 kg)   SpO2 99%   BMI 19.83 kg/m   Wt Readings from Last 3 Encounters:  07/02/19 138 lb 3.2 oz (62.7 kg)  06/05/19 139 lb 3.2 oz (63.1 kg)  03/05/19 131 lb 13.4 oz (59.8 kg)    Physical Exam Vitals reviewed.  Constitutional:      General: He is not in acute distress.    Appearance: He is well-developed. He is not toxic-appearing.  HENT:     Head: Normocephalic and atraumatic.  Cardiovascular:     Rate and Rhythm: Normal rate and regular rhythm.  Pulmonary:     Effort: Pulmonary effort is normal.     Breath sounds: Normal breath sounds. No wheezing.  Abdominal:     General: Bowel sounds are  normal.     Palpations: Abdomen is soft.     Tenderness: There is no abdominal tenderness.  Musculoskeletal:     Cervical back: Neck supple.     Right knee: Deformity present. Decreased range of motion. Tenderness present.     Right lower leg: No edema.     Left lower leg: No edema.  Lymphadenopathy:     Cervical: No cervical adenopathy.  Skin:    General: Skin is warm and dry.  Neurological:     Mental Status: He is alert and oriented to person, place, and time.  Psychiatric:        Attention and Perception: Attention normal.        Mood and Affect: Mood normal.        Speech: Speech normal.        Behavior: Behavior normal. Behavior is cooperative.           Assessment & Plan:    Encounter Diagnoses  Name Primary?  . Protein-calorie malnutrition, unspecified severity ([place]) Yes  . Tobacco use disorder   . Alcohol abuse, in remission   . Lung nodule < 6cm  on CT   . Osteoarthritis of right knee, unspecified osteoarthritis type      -pt has labs ordered by GI.  Pt to continue with GI per their recomendations  -plan to Repeat chest CT jan 2022 for re-evaluation of lung nodule 7mm  -encouraged pt to continue to Eat.  Also supplements would be helpful.  He is given several samples of boost.   His weight loss likely due to pancreas issues and alcohol abuse.  This is improving.   -pt is scheduled for covid vaccination appointment  -refer to orthopedist for R knee  -pt to follow up 6 months.  He is to contact office sooner prn

## 2019-07-05 DIAGNOSIS — IMO0001 Reserved for inherently not codable concepts without codable children: Secondary | ICD-10-CM | POA: Insufficient documentation

## 2019-07-05 DIAGNOSIS — F17200 Nicotine dependence, unspecified, uncomplicated: Secondary | ICD-10-CM | POA: Insufficient documentation

## 2019-07-05 DIAGNOSIS — R911 Solitary pulmonary nodule: Secondary | ICD-10-CM | POA: Insufficient documentation

## 2019-07-05 HISTORY — DX: Nicotine dependence, unspecified, uncomplicated: F17.200

## 2019-07-09 ENCOUNTER — Encounter (HOSPITAL_COMMUNITY): Payer: Self-pay | Admitting: Emergency Medicine

## 2019-07-09 ENCOUNTER — Other Ambulatory Visit: Payer: Self-pay

## 2019-07-09 ENCOUNTER — Emergency Department (HOSPITAL_COMMUNITY): Payer: Self-pay

## 2019-07-09 ENCOUNTER — Emergency Department (HOSPITAL_COMMUNITY)
Admission: EM | Admit: 2019-07-09 | Discharge: 2019-07-09 | Disposition: A | Discharge status: Home / Self Care | Payer: Self-pay | Attending: Emergency Medicine | Admitting: Emergency Medicine

## 2019-07-09 DIAGNOSIS — F17210 Nicotine dependence, cigarettes, uncomplicated: Secondary | ICD-10-CM | POA: Insufficient documentation

## 2019-07-09 DIAGNOSIS — Z79899 Other long term (current) drug therapy: Secondary | ICD-10-CM | POA: Insufficient documentation

## 2019-07-09 DIAGNOSIS — R221 Localized swelling, mass and lump, neck: Secondary | ICD-10-CM | POA: Insufficient documentation

## 2019-07-09 DIAGNOSIS — K1120 Sialoadenitis, unspecified: Secondary | ICD-10-CM | POA: Insufficient documentation

## 2019-07-09 LAB — CBC WITH DIFFERENTIAL/PLATELET
Abs Immature Granulocytes: 0.02 K/uL (ref 0.00–0.07)
Basophils Absolute: 0 K/uL (ref 0.0–0.1)
Basophils Relative: 1 %
Eosinophils Absolute: 0.2 K/uL (ref 0.0–0.5)
Eosinophils Relative: 3 %
HCT: 42.5 % (ref 39.0–52.0)
Hemoglobin: 13.5 g/dL (ref 13.0–17.0)
Immature Granulocytes: 0 %
Lymphocytes Relative: 36 %
Lymphs Abs: 2.2 K/uL (ref 0.7–4.0)
MCH: 29.9 pg (ref 26.0–34.0)
MCHC: 31.8 g/dL (ref 30.0–36.0)
MCV: 94.2 fL (ref 80.0–100.0)
Monocytes Absolute: 0.6 K/uL (ref 0.1–1.0)
Monocytes Relative: 10 %
Neutro Abs: 3.2 K/uL (ref 1.7–7.7)
Neutrophils Relative %: 50 %
Platelets: 131 K/uL — ABNORMAL LOW (ref 150–400)
RBC: 4.51 MIL/uL (ref 4.22–5.81)
RDW: 13.8 % (ref 11.5–15.5)
WBC: 6.2 K/uL (ref 4.0–10.5)
nRBC: 0 % (ref 0.0–0.2)

## 2019-07-09 LAB — COMPREHENSIVE METABOLIC PANEL WITH GFR
ALT: 11 U/L (ref 0–44)
AST: 17 U/L (ref 15–41)
Albumin: 3.9 g/dL (ref 3.5–5.0)
Alkaline Phosphatase: 70 U/L (ref 38–126)
Anion gap: 9 (ref 5–15)
BUN: 8 mg/dL (ref 8–23)
CO2: 26 mmol/L (ref 22–32)
Calcium: 9.3 mg/dL (ref 8.9–10.3)
Chloride: 102 mmol/L (ref 98–111)
Creatinine, Ser: 0.89 mg/dL (ref 0.61–1.24)
GFR calc Af Amer: >60 mL/min
GFR calc non Af Amer: >60 mL/min
Glucose, Bld: 99 mg/dL (ref 70–99)
Potassium: 4.1 mmol/L (ref 3.5–5.1)
Sodium: 137 mmol/L (ref 135–145)
Total Bilirubin: 0.8 mg/dL (ref 0.3–1.2)
Total Protein: 7.7 g/dL (ref 6.5–8.1)

## 2019-07-09 MED ORDER — AMOXICILLIN-POT CLAVULANATE 875-125 MG PO TABS
1.0000 | ORAL_TABLET | Freq: Once | ORAL | Status: AC
  Administered 2019-07-09: 1 via ORAL
  Filled 2019-07-09: qty 1

## 2019-07-09 MED ORDER — IOHEXOL 300 MG/ML  SOLN
75.0000 mL | Freq: Once | INTRAMUSCULAR | Status: AC | PRN
  Administered 2019-07-09: 75 mL via INTRAVENOUS

## 2019-07-09 MED ORDER — AMOXICILLIN-POT CLAVULANATE 875-125 MG PO TABS: 1.0000 | ORAL_TABLET | Freq: Two times a day (BID) | ORAL | 0 refills | Status: DC

## 2019-07-09 NOTE — ED Provider Notes (Addendum)
[facility] EMERGENCY DEPARTMENT Provider Note   CSN: [id number] Arrival date & time: 07/09/19  [id number]     History Chief Complaint  Patient presents with  . Abscess    Tanner Thomas is a 64 y.o. male.  Mass left inferior chin area for approximately 1 week.  Pain with chewing.  Able to swallow liquids.  He is making saliva.  No fever, sweats, chills, weight loss.  Severity is moderate.  Palpation makes pain worse        Past Medical History:  Diagnosis Date  . Known health problems: none     Patient Active Problem List   Diagnosis Date Noted  . Lung nodule < 6cm on CT 07/05/2019  . Tobacco use disorder 07/05/2019  . Loss of weight 03/05/2019  . Anemia 03/05/2019  . Abnormal CT of the abdomen 03/05/2019  . E coli Pyelonephritis 01/16/2019    Past Surgical History:  Procedure Laterality Date  . BIOPSY  03/06/2019   Procedure: BIOPSY;  Surgeon: Daneil Dolin, MD;  [facility];  Service: Endoscopy;;  gastric  . COLONOSCOPY WITH PROPOFOL N/A 03/06/2019   Procedure: COLONOSCOPY WITH PROPOFOL;  Surgeon: Daneil Dolin, MD;  Location: [facility];  Service: Endoscopy;  Laterality: N/A;  9:45am - ok at 10:00 per [facility]  . ESOPHAGOGASTRODUODENOSCOPY (EGD) WITH PROPOFOL N/A 03/06/2019   Procedure: ESOPHAGOGASTRODUODENOSCOPY (EGD) WITH PROPOFOL;  Surgeon: Daneil Dolin, MD;  Location: [facility];  Service: Endoscopy;  Laterality: N/A;  . PANCREAS SURGERY         Family History  Problem Relation Age of Onset  . Heart disease Father   . Colon cancer Neg Hx   . Gastric cancer Neg Hx   . Esophageal cancer Neg Hx     Social History   Tobacco Use  . Smoking status: Current Every Day Smoker    Packs/day: 0.25    Years: 50.00    Pack years: 12.50    Types: Cigarettes    Start date: 05/24/1967  . Smokeless tobacco: Never Used  Substance Use Topics  . Alcohol use: Not Currently    Alcohol/week: 13.0 standard drinks    Types: 13 Cans of beer per week     Comment: 03/04/19 states 3-4 a week; previously: as of 02-06-19 drinks about 12-13 beers weekly  . Drug use: Not Currently    Types: Marijuana    Comment: pt denies but + UDS 10/20    Home Medications Prior to Admission medications   Medication Sig Start Date End Date Taking? Authorizing Provider  lipase/protease/amylase (CREON) 36000 UNITS CPEP capsule Take 36,000 Units by mouth 3 (three) times daily with meals.   Yes [provider]  amoxicillin-clavulanate (AUGMENTIN) 875-125 MG tablet Take 1 tablet by mouth every 12 (twelve) hours. 07/09/19   Nat Christen, MD    Allergies    Patient has no known allergies.  Review of Systems   Review of Systems  All other systems reviewed and are negative.   Physical Exam Updated Vital Signs BP (!) 130/97 (BP Location: Right Arm)   Pulse 84   Temp 98.7 F (37.1 C) (Oral)   Resp 16   Ht 5\' 9"  (1.753 m)   Wt 60.8 kg   SpO2 99%   BMI 19.79 kg/m   Physical Exam Vitals and nursing note reviewed.  Constitutional:      Appearance: He is well-developed.  HENT:     Head: Normocephalic and atraumatic.  Eyes:  Conjunctiva/sclera: Conjunctivae normal.  Neck:     Comments: Left submental area: 2.0 cm firm mass Cardiovascular:     Rate and Rhythm: Normal rate and regular rhythm.  Pulmonary:     Effort: Pulmonary effort is normal.     Breath sounds: Normal breath sounds.  Abdominal:     General: Bowel sounds are normal.     Palpations: Abdomen is soft.  Musculoskeletal:        General: Normal range of motion.  Skin:    General: Skin is warm and dry.  Neurological:     General: No focal deficit present.     Mental Status: He is alert and oriented to person, place, and time.  Psychiatric:        Behavior: Behavior normal.     ED Results / Procedures / Treatments   Labs (all labs ordered are listed, but only abnormal results are displayed) Labs Reviewed  CBC WITH DIFFERENTIAL/PLATELET - Abnormal; Notable for the  following components:      Result Value   Platelets 131 (*)    All other components within normal limits  COMPREHENSIVE METABOLIC PANEL    EKG None  Radiology CT Soft Tissue Neck W Contrast  Result Date: 07/09/2019 CLINICAL DATA:  Left submental mass; neck mass, initial workup. EXAM: CT NECK WITH CONTRAST TECHNIQUE: Multidetector CT imaging of the neck was performed using the standard protocol following the bolus administration of intravenous contrast. CONTRAST:  67mL OMNIPAQUE IOHEXOL 300 MG/ML  SOLN COMPARISON:  No pertinent prior studies available for comparison. FINDINGS: Pharynx and larynx: There is asymmetric enhancement within the left floor of mouth (best appreciated on series 4, image 38). No appreciable discrete mass or swelling within the pharynx or larynx. Salivary glands: There is prominence of the bilateral submandibular ducts. Additionally, there is prominence of Wharton's duct bilaterally measuring up to 7 mm in width on the right and 6 mm in width on the left. However, no radiopaque obstructing sialolith is identified. There is asymmetric hyperenhancement of the left submandibular gland. The parotid glands are unremarkable. Thyroid: Unremarkable Lymph nodes: There is a mildly enlarged left level IB lymph node measuring 11 mm in short axis. Additionally, there is a mildly enlarged left level II lymph node measuring 13 mm in short axis (series 6, image 82). Vascular: The major vascular structures of the neck appear patent. Limited intracranial: Atherosclerotic calcifications of the carotid artery siphons. No other abnormality identified. Visualized orbits: Incompletely imaged. Visualized orbits demonstrate no acute abnormality. Mastoids and visualized paranasal sinuses: Scattered opacification of bilateral ethmoid air cells. Small air-fluid levels and frothy secretions as well as mild mucosal thickening within the bilateral maxillary sinuses. No significant mastoid effusion. Skeleton: No  acute bony abnormality or aggressive osseous lesion. Cervical spondylosis with multilevel posterior disc osteophytes, uncovertebral and facet hypertrophy. A somewhat prominent posterior disc osteophyte at C3-C4 contributes to at least mild spinal canal stenosis and contact the ventral spinal cord. Multilevel bony neural foraminal narrowing. Upper chest: Emphysema within the imaged lung apices. Right apical bulla. IMPRESSION: Dilation of the intraglandular submandibular ducts and of Wharton's duct bilaterally. No radiopaque obstructing sialolith is identified. However, there is asymmetric hyperenhancement of the left submandibular gland suspicious for sialoadenitis. Additionally, there is asymmetric enhancement within the left floor of mouth which may be infectious/inflammatory. However, a floor of mouth mass/malignancy at this site cannot be excluded and close clinical follow-up is recommended with imaging follow-up as warranted. Mildly enlarged left level IB and II lymph nodes may  be reactive, but also require close follow-up to exclude a neoplastic process/nodal metastatic disease. Paranasal sinus disease with air-fluid levels. Correlate for acute sinusitis. Cervical spondylosis as described. Electronically Signed   By: Kellie Simmering DO   On: 07/09/2019 12:58    Procedures Procedures (including critical care time)  Medications Ordered in ED Medications  amoxicillin-clavulanate (AUGMENTIN) 875-125 MG per tablet 1 tablet (has no administration in time range)  iohexol (OMNIPAQUE) 300 MG/ML solution 75 mL (75 mLs Intravenous Contrast Given 07/09/19 1215)    ED Course  I have reviewed the triage vital signs and the nursing notes.  Pertinent labs & imaging results that were available during my care of the patient were reviewed by me and considered in my medical decision making (see chart for details).    MDM Rules/Calculators/A&P                      We will obtain basic labs and contrasted CT of soft  tissue neck to further delineate.  1400: Recheck.  Discussed test results.  Patient understands that he must get otolaryngology follow-up for his mass.  Will Rx Augmentin 875/125 for sialoadenitis. Final Clinical Impression(s) / ED Diagnoses Final diagnoses:  Submental mass  Sialoadenitis    Rx / DC Orders ED Discharge Orders         Ordered    amoxicillin-clavulanate (AUGMENTIN) 875-125 MG tablet  Every 12 hours     07/09/19 1419           Nat Christen, MD 07/09/19 1124    Nat Christen, MD 07/09/19 1421

## 2019-07-09 NOTE — Discharge Instructions (Addendum)
Your salivary glands are blocked up and possibly infected.  Gargle with salt water.  Warm hot pack to neck.  Antibiotics.  Strongly recommend follow-up with an ear nose and throat specialist if symptoms do not improve to rule out cancer.  Phone number given.

## 2019-07-09 NOTE — ED Triage Notes (Signed)
Patient complains of abscess under left jaw x 1 week. States pain while chewing food. Denies problems swallowing liquid and controlling saliva. NAD.

## 2019-07-28 ENCOUNTER — Other Ambulatory Visit: Payer: Self-pay

## 2019-07-28 DIAGNOSIS — K862 Cyst of pancreas: Secondary | ICD-10-CM

## 2019-07-28 DIAGNOSIS — R935 Abnormal findings on diagnostic imaging of other abdominal regions, including retroperitoneum: Secondary | ICD-10-CM

## 2019-07-28 DIAGNOSIS — D649 Anemia, unspecified: Secondary | ICD-10-CM

## 2019-07-28 DIAGNOSIS — R634 Abnormal weight loss: Secondary | ICD-10-CM

## 2019-08-04 ENCOUNTER — Encounter: Payer: Self-pay | Admitting: Radiology

## 2019-08-18 ENCOUNTER — Other Ambulatory Visit (HOSPITAL_COMMUNITY)
Admission: RE | Admit: 2019-08-18 | Discharge: 2019-08-18 | Disposition: A | Discharge status: Home / Self Care | Payer: HRSA Program | Source: Ambulatory Visit | Attending: Gastroenterology | Admitting: Gastroenterology

## 2019-08-18 DIAGNOSIS — Z01812 Encounter for preprocedural laboratory examination: Secondary | ICD-10-CM | POA: Diagnosis present

## 2019-08-18 DIAGNOSIS — Z20822 Contact with and (suspected) exposure to covid-19: Secondary | ICD-10-CM | POA: Diagnosis not present

## 2019-08-18 LAB — SARS CORONAVIRUS 2 (TAT 6-24 HRS): SARS Coronavirus 2: NEGATIVE

## 2019-08-20 NOTE — Anesthesia Preprocedure Evaluation (Addendum)
Anesthesia Evaluation  Patient identified by MRN, date of birth, ID band Patient awake    Reviewed: Allergy & Precautions, NPO status , Patient's Chart, lab work & pertinent test results  Airway Mallampati: I  TM Distance: >3 FB Neck ROM: Full    Dental no notable dental hx. (+) Poor Dentition, Missing,    Pulmonary COPD, Current Smoker and Patient abstained from smoking.,  Reports 3 cigg/week nowm down significantly from before-COPD by h/o - denies o2 or inhaler use     Pulmonary exam normal breath sounds clear to auscultation       Cardiovascular Exercise Tolerance: Good negative cardio ROS Normal cardiovascular examI Rhythm:Regular Rate:Normal  States can walk miles on a level ground   Neuro/Psych negative neurological ROS  negative psych ROS   GI/Hepatic Neg liver ROS, Bowel prep,Abnormal pancreas   Endo/Other  negative endocrine ROS  Renal/GU negative Renal ROS  negative genitourinary   Musculoskeletal negative musculoskeletal ROS (+)   Abdominal Normal abdominal exam  (+)   Peds negative pediatric ROS (+)  Hematology negative hematology ROS (+) anemia ,   Anesthesia Other Findings   Reproductive/Obstetrics negative OB ROS                            Anesthesia Physical Anesthesia Plan  ASA: III  Anesthesia Plan: MAC   Post-op Pain Management:    Induction:   PONV Risk Score and Plan: 2 and Propofol infusion and TIVA  Airway Management Planned: Natural Airway and Simple Face Mask  Additional Equipment: None  Intra-op Plan:   Post-operative Plan:   Informed Consent: I have reviewed the patients History and Physical, chart, labs and discussed the procedure including the risks, benefits and alternatives for the proposed anesthesia with the patient or authorized representative who has indicated his/her understanding and acceptance.       Plan Discussed with:  CRNA  Anesthesia Plan Comments:         Anesthesia Quick Evaluation

## 2019-08-21 ENCOUNTER — Encounter (HOSPITAL_COMMUNITY)
Admission: RE | Disposition: A | Discharge status: Home / Self Care | Payer: Self-pay | Source: Ambulatory Visit | Attending: Gastroenterology

## 2019-08-21 ENCOUNTER — Ambulatory Visit (HOSPITAL_COMMUNITY)
Admission: RE | Admit: 2019-08-21 | Discharge: 2019-08-21 | Disposition: A | Discharge status: Home / Self Care | Payer: Self-pay | Source: Ambulatory Visit | Attending: Gastroenterology | Admitting: Gastroenterology

## 2019-08-21 ENCOUNTER — Ambulatory Visit (HOSPITAL_COMMUNITY): Payer: Self-pay | Admitting: Anesthesiology

## 2019-08-21 ENCOUNTER — Other Ambulatory Visit: Payer: Self-pay

## 2019-08-21 ENCOUNTER — Encounter (HOSPITAL_COMMUNITY): Payer: Self-pay | Admitting: Gastroenterology

## 2019-08-21 DIAGNOSIS — Z8249 Family history of ischemic heart disease and other diseases of the circulatory system: Secondary | ICD-10-CM | POA: Insufficient documentation

## 2019-08-21 DIAGNOSIS — R935 Abnormal findings on diagnostic imaging of other abdominal regions, including retroperitoneum: Secondary | ICD-10-CM

## 2019-08-21 DIAGNOSIS — K861 Other chronic pancreatitis: Secondary | ICD-10-CM | POA: Insufficient documentation

## 2019-08-21 DIAGNOSIS — K8020 Calculus of gallbladder without cholecystitis without obstruction: Secondary | ICD-10-CM | POA: Insufficient documentation

## 2019-08-21 DIAGNOSIS — J449 Chronic obstructive pulmonary disease, unspecified: Secondary | ICD-10-CM | POA: Insufficient documentation

## 2019-08-21 DIAGNOSIS — K766 Portal hypertension: Secondary | ICD-10-CM | POA: Insufficient documentation

## 2019-08-21 DIAGNOSIS — I8500 Esophageal varices without bleeding: Secondary | ICD-10-CM | POA: Insufficient documentation

## 2019-08-21 DIAGNOSIS — K862 Cyst of pancreas: Secondary | ICD-10-CM

## 2019-08-21 DIAGNOSIS — D649 Anemia, unspecified: Secondary | ICD-10-CM | POA: Insufficient documentation

## 2019-08-21 DIAGNOSIS — R634 Abnormal weight loss: Secondary | ICD-10-CM

## 2019-08-21 DIAGNOSIS — F17210 Nicotine dependence, cigarettes, uncomplicated: Secondary | ICD-10-CM | POA: Insufficient documentation

## 2019-08-21 DIAGNOSIS — I864 Gastric varices: Secondary | ICD-10-CM | POA: Insufficient documentation

## 2019-08-21 DIAGNOSIS — K838 Other specified diseases of biliary tract: Secondary | ICD-10-CM | POA: Insufficient documentation

## 2019-08-21 HISTORY — PX: EUS: SHX5427

## 2019-08-21 HISTORY — PX: ESOPHAGOGASTRODUODENOSCOPY (EGD) WITH PROPOFOL: SHX5813

## 2019-08-21 SURGERY — UPPER ENDOSCOPIC ULTRASOUND (EUS) RADIAL
Anesthesia: Monitor Anesthesia Care

## 2019-08-21 MED ORDER — PHENYLEPHRINE HCL (PRESSORS) 10 MG/ML IV SOLN
INTRAVENOUS | Status: DC | PRN
Start: 2019-08-21 — End: 2019-08-21
  Administered 2019-08-21: 120 ug via INTRAVENOUS

## 2019-08-21 MED ORDER — LIDOCAINE HCL (CARDIAC) PF 100 MG/5ML IV SOSY
PREFILLED_SYRINGE | INTRAVENOUS | Status: DC | PRN
  Administered 2019-08-21: 100 mg via INTRAVENOUS

## 2019-08-21 MED ORDER — PROPOFOL 1000 MG/100ML IV EMUL
INTRAVENOUS | Status: AC
  Filled 2019-08-21: qty 100

## 2019-08-21 MED ORDER — GLYCOPYRROLATE 0.2 MG/ML IJ SOLN
INTRAMUSCULAR | Status: DC | PRN
  Administered 2019-08-21: .1 mg via INTRAVENOUS

## 2019-08-21 MED ORDER — SODIUM CHLORIDE 0.9 % IV SOLN: INTRAVENOUS | Status: DC

## 2019-08-21 MED ORDER — LACTATED RINGERS IV SOLN
INTRAVENOUS | Status: DC
  Administered 2019-08-21: 1000 mL via INTRAVENOUS

## 2019-08-21 MED ORDER — PROPOFOL 500 MG/50ML IV EMUL
INTRAVENOUS | Status: DC | PRN
  Administered 2019-08-21: 350 ug/kg/min via INTRAVENOUS

## 2019-08-21 SURGICAL SUPPLY — 14 items

## 2019-08-21 NOTE — H&P (Signed)
HPI: This is a man [place] pancreatic [place] for unclear reasons but he thinks it was related to alcohol abuse.  Referred by Dr. Gala Romney for dilated bile duct. Has changes of chronic pancreatitis on imaging.  LFTs have been normal.   ROS: complete GI ROS as described in HPI, all other review negative.  Constitutional:  No unintentional weight loss   Past Medical History:  Diagnosis Date  . Known health problems: none     Past Surgical History:  Procedure Laterality Date  . BIOPSY  03/06/2019   Procedure: BIOPSY;  Surgeon: Daneil Dolin, MD;  [facility];  Service: Endoscopy;;  gastric  . COLONOSCOPY WITH PROPOFOL N/A 03/06/2019   Procedure: COLONOSCOPY WITH PROPOFOL;  Surgeon: Daneil Dolin, MD;  [facility];  Service: Endoscopy;  Laterality: N/A;  9:45am - ok at 10:00 per [facility]  . ESOPHAGOGASTRODUODENOSCOPY (EGD) WITH PROPOFOL N/A 03/06/2019   Procedure: ESOPHAGOGASTRODUODENOSCOPY (EGD) WITH PROPOFOL;  Surgeon: Daneil Dolin, MD;  [facility];  Service: Endoscopy;  Laterality: N/A;  . PANCREAS SURGERY      Current Facility-Administered Medications  Medication Dose Route Frequency Provider Last Rate Last Admin  . 0.9 %  sodium chloride infusion   Intravenous Continuous Milus Banister, MD      . lactated ringers infusion   Intravenous Continuous Milus Banister, MD 10 mL/hr at 08/21/19 0654 1,000 mL at 08/21/19 0654    Allergies as of 07/28/2019  . (No Known Allergies)    Family History  Problem Relation Age of Onset  . Heart disease Father   . Colon cancer Neg Hx   . Gastric cancer Neg Hx   . Esophageal cancer Neg Hx     Social History   Socioeconomic History  . Marital status: Single    Spouse name: Not on file  . Number of children: Not on file  . Years of education: Not on file  . Highest education level: Not on file  Occupational History  . Not on file  Tobacco Use  .  Smoking status: Current Every Day Smoker    Packs/day: 0.25    Years: 50.00    Pack years: 12.50    Types: Cigarettes    Start date: 05/24/1967  . Smokeless tobacco: Never Used  Substance and Sexual Activity  . Alcohol use: Not Currently    Alcohol/week: 13.0 standard drinks    Types: 13 Cans of beer per week    Comment: 03/04/19 states 3-4 a week; previously: as of 02-06-19 drinks about 12-13 beers weekly  . Drug use: Not Currently    Types: Marijuana    Comment: pt denies but + UDS 10/20  . Sexual activity: Yes  Other Topics Concern  . Not on file  Social History Narrative  . Not on file   Social Determinants of Health   Financial Resource Strain:   . Difficulty of Paying Living Expenses:   Food Insecurity:   . Worried About Charity fundraiser in the Last Year:   . Arboriculturist in the Last Year:   Transportation Needs:   . Film/video editor (Medical):   Marland Kitchen Lack of Transportation (Non-Medical):   Physical Activity:   . Days of Exercise per Week:   . Minutes of Exercise per Session:   Stress:   . Feeling of Stress :   Social Connections:   . Frequency of Communication with  Friends and Family:   . Frequency of Social Gatherings with Friends and Family:   . Attends Religious Services:   . Active Member of Clubs or Organizations:   . Attends Archivist Meetings:   Marland Kitchen Marital Status:   Intimate Partner Violence:   . Fear of Current or Ex-Partner:   . Emotionally Abused:   Marland Kitchen Physically Abused:   . Sexually Abused:      Physical Exam: BP 138/74   Pulse (!) 52   Temp 98.2 F (36.8 C) (Oral)   Resp 14   Ht 5\' 9"  (1.753 m)   Wt 57.6 kg   SpO2 99%   BMI 18.75 kg/m  Constitutional: generally well-appearing Psychiatric: alert and oriented x3 Abdomen: soft, nontender, nondistended, no obvious ascites, no peritoneal signs, normal bowel sounds No peripheral edema noted in lower extremities  Assessment and plan: 64 y.o. male with chronic calcific  pancreatitis from previous etoh abuse, dilated bile duct on imaging.  For upper EUS evaluation todayl.  Please see the "Patient Instructions" section for addition details about the plan.  Owens Loffler, MD [facility] 08/21/2019, 7:33 AM

## 2019-08-21 NOTE — Anesthesia Procedure Notes (Signed)
Procedure Name: MAC Date/Time: 08/21/2019 7:37 AM Performed by: Lissa Morales, CRNA Pre-anesthesia Checklist: Patient identified, Emergency Drugs available, Suction available, Patient being monitored and Timeout performed Patient Re-evaluated:Patient Re-evaluated prior to induction Oxygen Delivery Method: Simple face mask Placement Confirmation: positive ETCO2

## 2019-08-21 NOTE — Transfer of Care (Signed)
Immediate Anesthesia Transfer of Care Note  Patient: Tanner Thomas  Procedure(s) Performed: UPPER ENDOSCOPIC ULTRASOUND (EUS) RADIAL (N/A ) ESOPHAGOGASTRODUODENOSCOPY (EGD) WITH PROPOFOL (N/A )  Patient Location: PACU  Anesthesia Type:MAC  Level of Consciousness: awake, alert , oriented and patient cooperative  Airway & Oxygen Therapy: Patient Spontanous Breathing and Patient connected to face mask oxygen  Post-op Assessment: Report given to RN, Post -op Vital signs reviewed and stable and Patient moving all extremities X 4  Post vital signs: stable  Last Vitals:  Vitals Value Taken Time  BP    Temp    Pulse    Resp    SpO2      Last Pain:  Vitals:   08/21/19 9924  TempSrc: Oral  PainSc: 0-No pain         Complications: No apparent anesthesia complications

## 2019-08-21 NOTE — Op Note (Addendum)
[facility] Patient Name: Tanner Thomas Procedure Date: 08/21/2019 MRN: [id number] Attending MD: Milus Banister , MD Date of Birth: 1956-01-17 CSN: [id number] Age: 64 Admit Type: Outpatient Procedure:                Upper EUS Indications:              Remote pancreatic surgery at outside hospital,                            unclear why but patient thinks it was due to etoh                            related issues. Chronic calicific pancreatitis on                            imaging, dilated CBD with ? stones or debris                            within, normal liver tests. Recent EGD Dr. Gala Romney                            showed portal hypertensive changes. No obvious                            cirrhosis on imaging. Providers:                Milus Banister, MD, Cleda Daub, RN, Theodora Blow, Technician, Enrigue Catena, CRNA Referring MD:              Medicines:                Monitored Anesthesia Care Complications:            No immediate complications. Estimated blood loss:                            None. Estimated Blood Loss:     Estimated blood loss: none. Procedure:                Pre-Anesthesia Assessment:                           - Prior to the procedure, a History and Physical                            was performed, and patient medications and                            allergies were reviewed. The patient's tolerance of                            previous anesthesia was also reviewed. The risks  and benefits of the procedure and the sedation                            options and risks were discussed with the patient.                            All questions were answered, and informed consent                            was obtained. Prior Anticoagulants: The patient has                            taken no previous anticoagulant or antiplatelet                            agents. ASA Grade Assessment: IV -  A patient with                            severe systemic disease that is a constant threat                            to life. After reviewing the risks and benefits,                            the patient was deemed in satisfactory condition to                            undergo the procedure.                           After obtaining informed consent, the endoscope was                            passed under direct vision. Throughout the                            procedure, the patient's blood pressure, pulse, and                            oxygen saturations were monitored continuously. The                            [id number]) Olympus Radial EUS was                            introduced through the mouth, and advanced to the                            second part of duodenum. The [id number])                            [place] was introduced through the  mouth, and advanced to the second part of duodenum.                            The upper EUS was accomplished without difficulty.                            The patient tolerated the procedure well. Scope In: Scope Out: Findings:      ENDOSCOPIC FINDING (limited views with radial echoendoscopy and ERCP       scope): :      1. Small distal esophagus varices.      2. Portal gastropathy.      3. Somewhat bulging but otherwise normal major papilla.      ENDOSONOGRAPHIC FINDING: :      1. The CBD was dilated to 32mm and filled with amorphous isoechoic       filling defect (I favor this to be biliary debris/soft stone debris >>       soft tissue).      2. The pancreatic parenchyma was atrophic throughout and there were       numerous hyperechoci foci (pancreatic calcifications). There were no       discrete mass lesions.      3. Main pancreatic duct was ectactic and dilated throughout head, neck,       body and tail (diameter up to 5-76mm).      4. In head of pancreas there was  a 4.26mm hyperechoic foci that either       resides in the very distal CBD, PD or pancreatic head parenchyma (quite       difficult to determine).      5. No peripancreatic adenopathy.      6. Multiple, large subepithelial gastric vessels (gastric varices).      7. Gallstones in gallbladder. Impression:               - CBD is dilated and filled with biliary                            debris/soft stone (more likely than soft tissue                            within the bile duct). Normal liver tests                           - Chronic, calcific pancreatitis (parenchymal and                            ductal changes). Likely from alcohol abuse, he had                            a remote pancreatic surgery at out of state                            facility. Not clear what was done, he thinks he                            needed the surgery to alcohol related issues. No  discrete mass lesions in the pancreas however the                            changes of chronic pancreatitis can greatly                            decrease the ability to detect tumors by EUS (an                            other imaging as well).                           - Portal hypertension: gastric varices by EUS,                            esophageal varices and portal gastropathy                            endoscopically. Moderate Sedation:      Not Applicable - Patient had care per Anesthesia. Recommendation:           - He will need ERCP to clear his bile duct and also                            surgical referral to consider cholecystectomy. I                            will discuss with his referring GI team. Procedure Code(s):        --- Professional ---                           [phone], Esophagogastroduodenoscopy, flexible,                            transoral; with endoscopic ultrasound examination                            limited to the esophagus, stomach or duodenum, and                             adjacent structures Diagnosis Code(s):        --- Professional ---                           K83.8, Other specified diseases of biliary tract CPT copyright 2019 American [employer] Association. All rights reserved. The codes documented in this report are preliminary and upon coder review may  be revised to meet current compliance requirements. Milus Banister, MD 08/21/2019 8:19:59 AM This report has been signed electronically. Number of Addenda: 0

## 2019-08-21 NOTE — Discharge Instructions (Signed)
YOU HAD AN ENDOSCOPIC PROCEDURE TODAY: Refer to the procedure report and other information in the discharge instructions given to you for any specific questions about what was found during the examination. If this information does not answer your questions, please call [place] office at [phone] to clarify.  ° °YOU SHOULD EXPECT: Some feelings of bloating in the abdomen. Passage of more gas than usual. Walking can help get rid of the air that was put into your GI tract during the procedure and reduce the bloating. If you had a lower endoscopy (such as a colonoscopy or flexible sigmoidoscopy) you may notice spotting of blood in your stool or on the toilet paper. Some abdominal soreness may be present for a day or two, also. ° °DIET: Your first meal following the procedure should be a light meal and then it is ok to progress to your normal diet. A half-sandwich or bowl of soup is an example of a good first meal. Heavy or fried foods are harder to digest and may make you feel nauseous or bloated. Drink plenty of fluids but you should avoid alcoholic beverages for 24 hours. If you had a esophageal dilation, please see attached instructions for diet.   ° °ACTIVITY: Your care partner should take you home directly after the procedure. You should plan to take it easy, moving slowly for the rest of the day. You can resume normal activity the day after the procedure however YOU SHOULD NOT DRIVE, use power tools, machinery or perform tasks that involve climbing or major physical exertion for 24 hours (because of the sedation medicines used during the test).  ° °SYMPTOMS TO REPORT IMMEDIATELY: °A gastroenterologist can be reached at any hour. Please call [phone]  for any of the following symptoms:  °Following lower endoscopy (colonoscopy, flexible sigmoidoscopy) °Excessive amounts of blood in the stool  °Significant tenderness, worsening of abdominal pains  °Swelling of the abdomen that is new, acute  °Fever of 100° or  higher  °Following upper endoscopy (EGD, EUS, ERCP, esophageal dilation) °Vomiting of blood or coffee ground material  °New, significant abdominal pain  °New, significant chest pain or pain under the shoulder blades  °Painful or persistently difficult swallowing  °New shortness of breath  °Black, tarry-looking or red, bloody stools ° °FOLLOW UP:  °If any biopsies were taken you will be contacted by phone or by letter within the next 1-3 weeks. Call [phone]  if you have not heard about the biopsies in 3 weeks.  °Please also call with any specific questions about appointments or follow up tests. ° °

## 2019-08-21 NOTE — Anesthesia Postprocedure Evaluation (Signed)
Anesthesia Post Note  Patient: Tanner Thomas  Procedure(s) Performed: UPPER ENDOSCOPIC ULTRASOUND (EUS) RADIAL (N/A ) ESOPHAGOGASTRODUODENOSCOPY (EGD) WITH PROPOFOL (N/A )     Patient location during evaluation: PACU Anesthesia Type: MAC Level of consciousness: awake and alert Pain management: pain level controlled Vital Signs Assessment: post-procedure vital signs reviewed and stable Respiratory status: spontaneous breathing, nonlabored ventilation and respiratory function stable Cardiovascular status: blood pressure returned to baseline and stable Postop Assessment: no apparent nausea or vomiting Anesthetic complications: no    Last Vitals:  Vitals:   08/21/19 0835 08/21/19 0850  BP: 137/73 140/75  Pulse: (!) 55 (!) 59  Resp: 13 13  Temp:    SpO2:  100%    Last Pain:  Vitals:   08/21/19 0850  TempSrc:   PainSc: 0-No pain                 Pervis Hocking

## 2019-08-22 ENCOUNTER — Telehealth: Payer: Self-pay

## 2019-08-22 ENCOUNTER — Encounter: Payer: Self-pay | Admitting: *Deleted

## 2019-08-22 DIAGNOSIS — R932 Abnormal findings on diagnostic imaging of liver and biliary tract: Secondary | ICD-10-CM

## 2019-08-22 NOTE — Telephone Encounter (Signed)
Referral sent to Dr. Runell Gess. Constance Haw via [place].

## 2019-08-22 NOTE — Telephone Encounter (Signed)
Spoke with pts sister Godofredo Heilbrun [phone]). Pt moved down 1 year ago and they are willing to see any surgeon recommended for GB.

## 2019-08-22 NOTE — Addendum Note (Signed)
Addended by: Hassan Rowan on: 08/22/2019 09:37 AM   Modules accepted: Orders

## 2019-08-22 NOTE — Telephone Encounter (Signed)
----- Message from Daneil Dolin, MD sent at 08/22/2019  8:02 AM EDT ----- Melissa Montane:  Please go ahead and do the ERCP. Will ask pt if he has a preference for surgeon regarding GB. Thanks.  Ronalee Belts ----- Message ----- From: Milus Banister, MD Sent: 08/21/2019   8:38 AM EDT To: Daneil Dolin, MD, Carlis Stable, NP  Ethelene Hal, I just completed his EUS. See full report in epic.   He will need and ERCP and also referral to surgery to consider GB resection.  Let me know if you guys will take care of that locally.  I am happy to help with ERCP if needed.  Thanks   I think his weight loss is from pancreatic insufficiency and he has signficant portal hypertension as well. No obvious cirrhosis on imaging so perhaps his portal hypertension changes are due to splenic vein issues from chronic calcific pancreatitis.   DJ     Impression:               - CBD is dilated and filled with biliary                            debris/soft stone (more likely than soft tissue                            within the bile duct). Normal liver tests                           - Chronic, calcific pancreatitis (parenchymal and                            ductal changes). Likely from alcohol abuse, he had                            a remote pancreatic surgery at out of state                            facility. Not clear what was done, he thinks he                            needed the surgery to alcohol related issues. No                            discrete mass lesions in the pancreas however the                            changes of chronic pancreatitis can greatly                            decrease the ability to detect tumors by EUS (an                            other imaging as well).                           - Portal hypertension: gastric varices by EUS,  esophageal varices and portal gastropathy                            endoscopically.

## 2019-08-25 ENCOUNTER — Telehealth: Payer: Self-pay

## 2019-08-25 ENCOUNTER — Other Ambulatory Visit: Payer: Self-pay

## 2019-08-25 DIAGNOSIS — K838 Other specified diseases of biliary tract: Secondary | ICD-10-CM

## 2019-08-25 NOTE — Telephone Encounter (Signed)
appt made for ERCP with Dr Ardis Hughs on 6/3 at 830 am [facility] COVID test on 5/31 at 1010 am

## 2019-08-25 NOTE — Telephone Encounter (Signed)
----- Message from Milus Banister, MD sent at 08/25/2019  5:28 AM EDT ----- Will do.  Thanks  Silvie Obremski, He needs ERCP with me, next available thusday at [facility].  For dilated bile duct filled with debris, stone sludge.   Thanks  ----- Message ----- From: Daneil Dolin, MD Sent: 08/22/2019   8:02 AM EDT To: Milus Banister, MD, Derrick Ravel, CMA  Melissa Montane:  Please go ahead and do the ERCP. Will ask pt if he has a preference for surgeon regarding GB. Thanks.  Ronalee Belts ----- Message ----- From: Milus Banister, MD Sent: 08/21/2019   8:38 AM EDT To: Daneil Dolin, MD, Carlis Stable, NP  Ethelene Hal, I just completed his EUS. See full report in epic.   He will need and ERCP and also referral to surgery to consider GB resection.  Let me know if you guys will take care of that locally.  I am happy to help with ERCP if needed.  Thanks   I think his weight loss is from pancreatic insufficiency and he has signficant portal hypertension as well. No obvious cirrhosis on imaging so perhaps his portal hypertension changes are due to splenic vein issues from chronic calcific pancreatitis.   DJ     Impression:               - CBD is dilated and filled with biliary                            debris/soft stone (more likely than soft tissue                            within the bile duct). Normal liver tests                           - Chronic, calcific pancreatitis (parenchymal and                            ductal changes). Likely from alcohol abuse, he had                            a remote pancreatic surgery at out of state                            facility. Not clear what was done, he thinks he                            needed the surgery to alcohol related issues. No                            discrete mass lesions in the pancreas however the                            changes of chronic pancreatitis can greatly                            decrease the ability to detect tumors by EUS (an  other imaging as well).                           - Portal hypertension: gastric varices by EUS,                            esophageal varices and portal gastropathy                            endoscopically.

## 2019-08-25 NOTE — Telephone Encounter (Signed)
Left message on machine to call back instructions mailed.

## 2019-08-26 ENCOUNTER — Ambulatory Visit (INDEPENDENT_AMBULATORY_CARE_PROVIDER_SITE_OTHER): Payer: Self-pay | Admitting: General Surgery

## 2019-08-26 ENCOUNTER — Other Ambulatory Visit: Payer: Self-pay

## 2019-08-26 ENCOUNTER — Encounter: Payer: Self-pay | Admitting: General Surgery

## 2019-08-26 VITALS — BP 109/77 | HR 85 | Temp 98.1°F | Resp 12 | Ht 70.0 in | Wt 140.0 lb

## 2019-08-26 DIAGNOSIS — K8020 Calculus of gallbladder without cholecystitis without obstruction: Secondary | ICD-10-CM

## 2019-08-26 NOTE — Telephone Encounter (Signed)
Left message with family member to have pt return call  

## 2019-08-26 NOTE — Patient Instructions (Signed)
Laparoscopic Cholecystectomy Laparoscopic cholecystectomy is surgery to remove the gallbladder. The gallbladder is a pear-shaped organ that lies beneath the liver on the right side of the body. The gallbladder stores bile, which is a fluid that helps the body to digest fats. Cholecystectomy is often done for inflammation of the gallbladder (cholecystitis). This condition is usually caused by a buildup of gallstones (cholelithiasis) in the gallbladder. Gallstones can block the flow of bile, which can result in inflammation and pain. In severe cases, emergency surgery may be required. This procedure is done though small incisions in your abdomen (laparoscopic surgery). A thin scope with a camera (laparoscope) is inserted through one incision. Thin surgical instruments are inserted through the other incisions. In some cases, a laparoscopic procedure may be turned into a type of surgery that is done through a larger incision (open surgery). Tell a health care provider about:  Any allergies you have.  All medicines you are taking, including vitamins, herbs, eye drops, creams, and over-the-counter medicines.  Any problems you or family members have had with anesthetic medicines.  Any blood disorders you have.  Any surgeries you have had.  Any medical conditions you have.  Whether you are pregnant or may be pregnant. What are the risks? Generally, this is a safe procedure. However, problems may occur, including:  Infection.  Bleeding.  Allergic reactions to medicines.  Damage to other structures or organs.  A stone remaining in the common bile duct. The common bile duct carries bile from the gallbladder into the small intestine.  A bile leak from the cyst duct that is clipped when your gallbladder is removed. What happens before the procedure?   Medicines  Ask your health care provider about: ? Changing or stopping your regular medicines. This is especially important if you are taking  diabetes medicines or blood thinners. ? Taking medicines such as aspirin and ibuprofen. These medicines can thin your blood. Do not take these medicines before your procedure if your health care provider instructs you not to.  You may be given antibiotic medicine to help prevent infection. General instructions  Let your health care provider know if you develop a cold or an infection before surgery.  Plan to have someone take you home from the hospital or clinic.  Ask your health care provider how your surgical site will be marked or identified. What happens during the procedure?   To reduce your risk of infection: ? Your health care team will wash or sanitize their hands. ? Your skin will be washed with soap. ? Hair may be removed from the surgical area.  An IV tube may be inserted into one of your veins.  You will be given one or more of the following: ? A medicine to help you relax (sedative). ? A medicine to make you fall asleep (general anesthetic).  A breathing tube will be placed in your mouth.  Your surgeon will make several small cuts (incisions) in your abdomen.  The laparoscope will be inserted through one of the small incisions. The camera on the laparoscope will send images to a TV screen (monitor) in the operating room. This lets your surgeon see inside your abdomen.  Air-like gas will be pumped into your abdomen. This will expand your abdomen to give the surgeon more room to perform the surgery.  Other tools that are needed for the procedure will be inserted through the other incisions. The gallbladder will be removed through one of the incisions.  Your common bile duct   may be examined. If stones are found in the common bile duct, they may be removed.  After your gallbladder has been removed, the incisions will be closed with stitches (sutures), staples, or skin glue.  Your incisions may be covered with a bandage (dressing). The procedure may vary among health  care providers and hospitals. What happens after the procedure?  Your blood pressure, heart rate, breathing rate, and blood oxygen level will be monitored until the medicines you were given have worn off.  You will be given medicines as needed to control your pain.  Do not drive for 24 hours if you were given a sedative. This information is not intended to replace advice given to you by your health care provider. Make sure you discuss any questions you have with your health care provider. Document Revised: 03/23/2017 Document Reviewed: 09/27/2015 Elsevier Patient Education  2020 [employer]  

## 2019-08-27 NOTE — Telephone Encounter (Signed)
ERCP scheduled, pt instructed and medications reviewed.  Patient instructions mailed to home.  Patient to call with any questions or concerns.  

## 2019-08-27 NOTE — Progress Notes (Signed)
Tanner Thomas; [id number]; 18-Oct-1955   HPI Patient is a 64 year old black male who was referred to my care by Soyla Dryer and Dr. Gala Romney of gastroenterology for cholelithiasis.  Patient was found on hepatobiliary work-up to have a common bile duct of 15 mm.  He was also noted to have cholelithiasis.  He has had alcoholic pancreatitis in the past.  It was noted on MRCP to have debris within the common bile duct.  Dr. Ardis Hughs of gastroenterology in [place] intends to do an ERCP in June.  Patient has been referred to my care for cholecystectomy prior to the ERCP.  Patient does drink alcohol fairly frequently.  He is variable as to his daily input.  He currently has 0 out of 10 abdominal pain.  He denies any right upper quadrant abdominal pain, nausea, or fatty food intolerance.  He denies any jaundice.  Patient has had an unknown upper abdominal surgery for which she says it was for his pancreatitis.  He cannot remember exactly what the procedure was. Past Medical History:  Diagnosis Date  . Known health problems: none     Past Surgical History:  Procedure Laterality Date  . BIOPSY  03/06/2019   Procedure: BIOPSY;  Surgeon: Daneil Dolin, MD;  [facility]: [facility];  Service: Endoscopy;;  gastric  . COLONOSCOPY WITH PROPOFOL N/A 03/06/2019   Procedure: COLONOSCOPY WITH PROPOFOL;  Surgeon: Daneil Dolin, MD;  Location: [facility];  Service: Endoscopy;  Laterality: N/A;  9:45am - ok at 10:00 per [facility]  . ESOPHAGOGASTRODUODENOSCOPY (EGD) WITH PROPOFOL N/A 03/06/2019   Procedure: ESOPHAGOGASTRODUODENOSCOPY (EGD) WITH PROPOFOL;  Surgeon: Daneil Dolin, MD;  [facility]: [facility];  Service: Endoscopy;  Laterality: N/A;  . ESOPHAGOGASTRODUODENOSCOPY (EGD) WITH PROPOFOL N/A 08/21/2019   Procedure: ESOPHAGOGASTRODUODENOSCOPY (EGD) WITH PROPOFOL;  Surgeon: Milus Banister, MD;  Location: [facility];  Service: Endoscopy;  Laterality: N/A;  . EUS N/A 08/21/2019   Procedure: UPPER ENDOSCOPIC  ULTRASOUND (EUS) RADIAL;  Surgeon: Milus Banister, MD;  Location: [facility];  Service: Endoscopy;  Laterality: N/A;  . PANCREAS SURGERY      Family History  Problem Relation Age of Onset  . Heart disease Father   . Colon cancer Neg Hx   . Gastric cancer Neg Hx   . Esophageal cancer Neg Hx     Current Outpatient Medications on File Prior to Visit  Medication Sig Dispense Refill  . amoxicillin-clavulanate (AUGMENTIN) 875-125 MG tablet Take 1 tablet by mouth every 12 (twelve) hours. (Patient not taking: Reported on 08/26/2019) 20 tablet 0  . lipase/protease/amylase (CREON) 36000 UNITS CPEP capsule Take 36,000 Units by mouth 3 (three) times daily with meals.     No current facility-administered medications on file prior to visit.    No Known Allergies  Social History   Substance and Sexual Activity  Alcohol Use Not Currently  . Alcohol/week: 13.0 standard drinks  . Types: 13 Cans of beer per week   Comment: 03/04/19 states 3-4 a week; previously: as of 02-06-19 drinks about 12-13 beers weekly    Social History   Tobacco Use  Smoking Status Current Every Day Smoker  . Packs/day: 0.25  . Years: 50.00  . Pack years: 12.50  . Types: Cigarettes  . Start date: 05/24/1967  Smokeless Tobacco Never Used    Review of Systems  Constitutional: Negative.   HENT: Negative.   Eyes: Negative.   Respiratory: Negative.   Cardiovascular: Negative.   Gastrointestinal: Negative.   Genitourinary: Negative.  Musculoskeletal: Negative.   Skin: Negative.   Neurological: Negative.   Endo/Heme/Allergies: Negative.   Psychiatric/Behavioral: Negative.     Objective   Vitals:   08/26/19 1347  BP: 109/77  Pulse: 85  Resp: 12  Temp: 98.1 F (36.7 C)  SpO2: 97%    Physical Exam Vitals reviewed.  Constitutional:      Appearance: Normal appearance. He is normal weight. He is not ill-appearing.  HENT:     Head: Normocephalic and atraumatic.  Eyes:     General: No scleral  icterus. Cardiovascular:     Rate and Rhythm: Normal rate and regular rhythm.     Heart sounds: Normal heart sounds. No murmur. No friction rub. No gallop.   Pulmonary:     Effort: Pulmonary effort is normal. No respiratory distress.     Breath sounds: Normal breath sounds. No stridor. No wheezing, rhonchi or rales.  Abdominal:     General: Abdomen is flat. Bowel sounds are normal. There is no distension.     Palpations: Abdomen is soft. There is no mass.     Tenderness: There is no abdominal tenderness. There is no guarding or rebound.     Hernia: No hernia is present.  Skin:    General: Skin is warm and dry.  Neurological:     Mental Status: He is alert and oriented to person, place, and time.   GI notes reviewed, previous admission reviewed  Assessment  Cholelithiasis, dilated common bile duct, alcohol abuse, early alcoholic cirrhosis Plan   Patient is scheduled for laparoscopic cholecystectomy, possible open on 09/05/2019.  The risks and benefits of the procedure including bleeding, infection, hepatobiliary injury, and the possibility of an open procedure were fully explained to the patient, who gave informed consent.

## 2019-08-27 NOTE — H&P (Signed)
Tanner Thomas; [id number]; 1955-07-23   HPI Patient is a 64 year old black male who was referred to my care by Soyla Dryer and Dr. Gala Romney of gastroenterology for cholelithiasis.  Patient was found on hepatobiliary work-up to have a common bile duct of 15 mm.  He was also noted to have cholelithiasis.  He has had alcoholic pancreatitis in the past.  It was noted on MRCP to have debris within the common bile duct.  Dr. Ardis Hughs of gastroenterology in [place] intends to do an ERCP in June.  Patient has been referred to my care for cholecystectomy prior to the ERCP.  Patient does drink alcohol fairly frequently.  He is variable as to his daily input.  He currently has 0 out of 10 abdominal pain.  He denies any right upper quadrant abdominal pain, nausea, or fatty food intolerance.  He denies any jaundice.  Patient has had an unknown upper abdominal surgery for which she says it was for his pancreatitis.  He cannot remember exactly what the procedure was. Past Medical History:  Diagnosis Date  . Known health problems: none     Past Surgical History:  Procedure Laterality Date  . BIOPSY  03/06/2019   Procedure: BIOPSY;  Surgeon: Daneil Dolin, MD;  [facility];  Service: Endoscopy;;  gastric  . COLONOSCOPY WITH PROPOFOL N/A 03/06/2019   Procedure: COLONOSCOPY WITH PROPOFOL;  Surgeon: Daneil Dolin, MD;  [facility]: [facility];  Service: Endoscopy;  Laterality: N/A;  9:45am - ok at 10:00 per [facility]  . ESOPHAGOGASTRODUODENOSCOPY (EGD) WITH PROPOFOL N/A 03/06/2019   Procedure: ESOPHAGOGASTRODUODENOSCOPY (EGD) WITH PROPOFOL;  Surgeon: Daneil Dolin, MD;  [facility]: [facility];  Service: Endoscopy;  Laterality: N/A;  . ESOPHAGOGASTRODUODENOSCOPY (EGD) WITH PROPOFOL N/A 08/21/2019   Procedure: ESOPHAGOGASTRODUODENOSCOPY (EGD) WITH PROPOFOL;  Surgeon: Milus Banister, MD;  [facility]: [facility];  Service: Endoscopy;  Laterality: N/A;  . EUS N/A 08/21/2019   Procedure: UPPER ENDOSCOPIC  ULTRASOUND (EUS) RADIAL;  Surgeon: Milus Banister, MD;  Location: [facility];  Service: Endoscopy;  Laterality: N/A;  . PANCREAS SURGERY      Family History  Problem Relation Age of Onset  . Heart disease Father   . Colon cancer Neg Hx   . Gastric cancer Neg Hx   . Esophageal cancer Neg Hx     Current Outpatient Medications on File Prior to Visit  Medication Sig Dispense Refill  . amoxicillin-clavulanate (AUGMENTIN) 875-125 MG tablet Take 1 tablet by mouth every 12 (twelve) hours. (Patient not taking: Reported on 08/26/2019) 20 tablet 0  . lipase/protease/amylase (CREON) 36000 UNITS CPEP capsule Take 36,000 Units by mouth 3 (three) times daily with meals.     No current facility-administered medications on file prior to visit.    No Known Allergies  Social History   Substance and Sexual Activity  Alcohol Use Not Currently  . Alcohol/week: 13.0 standard drinks  . Types: 13 Cans of beer per week   Comment: 03/04/19 states 3-4 a week; previously: as of 02-06-19 drinks about 12-13 beers weekly    Social History   Tobacco Use  Smoking Status Current Every Day Smoker  . Packs/day: 0.25  . Years: 50.00  . Pack years: 12.50  . Types: Cigarettes  . Start date: 05/24/1967  Smokeless Tobacco Never Used    Review of Systems  Constitutional: Negative.   HENT: Negative.   Eyes: Negative.   Respiratory: Negative.   Cardiovascular: Negative.   Gastrointestinal: Negative.   Genitourinary: Negative.  Musculoskeletal: Negative.   Skin: Negative.   Neurological: Negative.   Endo/Heme/Allergies: Negative.   Psychiatric/Behavioral: Negative.     Objective   Vitals:   08/26/19 1347  BP: 109/77  Pulse: 85  Resp: 12  Temp: 98.1 F (36.7 C)  SpO2: 97%    Physical Exam Vitals reviewed.  Constitutional:      Appearance: Normal appearance. He is normal weight. He is not ill-appearing.  HENT:     Head: Normocephalic and atraumatic.  Eyes:     General: No scleral  icterus. Cardiovascular:     Rate and Rhythm: Normal rate and regular rhythm.     Heart sounds: Normal heart sounds. No murmur. No friction rub. No gallop.   Pulmonary:     Effort: Pulmonary effort is normal. No respiratory distress.     Breath sounds: Normal breath sounds. No stridor. No wheezing, rhonchi or rales.  Abdominal:     General: Abdomen is flat. Bowel sounds are normal. There is no distension.     Palpations: Abdomen is soft. There is no mass.     Tenderness: There is no abdominal tenderness. There is no guarding or rebound.     Hernia: No hernia is present.  Skin:    General: Skin is warm and dry.  Neurological:     Mental Status: He is alert and oriented to person, place, and time.   GI notes reviewed, previous admission reviewed  Assessment  Cholelithiasis, dilated common bile duct, alcohol abuse, early alcoholic cirrhosis Plan   Patient is scheduled for laparoscopic cholecystectomy, possible open on 09/05/2019.  The risks and benefits of the procedure including bleeding, infection, hepatobiliary injury, and the possibility of an open procedure were fully explained to the patient, who gave informed consent.

## 2019-09-02 NOTE — Patient Instructions (Signed)
Tanner Thomas  09/02/2019     @PREFPERIOPPHARMACY @   Your procedure is scheduled on  09/05/2019 .  Report to [facility] at  [phone]  A.M.  Call this number if you have problems the morning of surgery:  [phone]   Remember:  Do not eat or drink after midnight.                        Take these medicines the morning of surgery with A SIP OF WATER  none   Do not wear jewelry, make-up or nail polish.  Do not wear lotions, powders, or perfume. Please wear deodorant and brush your teeth.  Do not shave 48 hours prior to surgery.  Men may shave face and neck.  Do not bring valuables to the hospital.  [facility] is not responsible for any belongings or valuables.  Contacts, dentures or bridgework may not be worn into surgery.  Leave your suitcase in the car.  After surgery it may be brought to your room.  For patients admitted to the hospital, discharge time will be determined by your treatment team.  Patients discharged the day of surgery will not be allowed to drive home.   Name and phone number of your driver:   family Special instructions:  None  Please read over the following fact sheets that you were given. Anesthesia Post-op Instructions and Care and Recovery After Surgery       Laparoscopic Cholecystectomy, Care After This sheet gives you information about how to care for yourself after your procedure. Your health care provider may also give you more specific instructions. If you have problems or questions, contact your health care provider. What can I expect after the procedure? After the procedure, it is common to have:  Pain at your incision sites. You will be given medicines to control this pain.  Mild nausea or vomiting.  Bloating and possible shoulder pain from the air-like gas that was used during the procedure. Follow these instructions at home: Incision care   Follow instructions from your health care provider about how to take care of your  incisions. Make sure you: ? Wash your hands with soap and water before you change your bandage (dressing). If soap and water are not available, use hand sanitizer. ? Change your dressing as told by your health care provider. ? Leave stitches (sutures), skin glue, or adhesive strips in place. These skin closures may need to be in place for 2 weeks or longer. If adhesive strip edges start to loosen and curl up, you may trim the loose edges. Do not remove adhesive strips completely unless your health care provider tells you to do that.  Do not take baths, swim, or use a hot tub until your health care provider approves. Ask your health care provider if you can take showers. You may only be allowed to take sponge baths for bathing.  Check your incision area every day for signs of infection. Check for: ? More redness, swelling, or pain. ? More fluid or blood. ? Warmth. ? Pus or a bad smell. Activity  Do not drive or use heavy machinery while taking prescription pain medicine.  Do not lift anything that is heavier than 10 lb (4.5 kg) until your health care provider approves.  Do not play contact sports until your health care provider approves.  Do not drive for 24 hours if you were given a medicine to  help you relax (sedative).  Rest as needed. Do not return to work or school until your health care provider approves. General instructions  Take over-the-counter and prescription medicines only as told by your health care provider.  To prevent or treat constipation while you are taking prescription pain medicine, your health care provider may recommend that you: ? Drink enough fluid to keep your urine clear or pale yellow. ? Take over-the-counter or prescription medicines. ? Eat foods that are high in fiber, such as fresh fruits and vegetables, whole grains, and beans. ? Limit foods that are high in fat and processed sugars, such as fried and sweet foods. Contact a health care provider  if:  You develop a rash.  You have more redness, swelling, or pain around your incisions.  You have more fluid or blood coming from your incisions.  Your incisions feel warm to the touch.  You have pus or a bad smell coming from your incisions.  You have a fever.  One or more of your incisions breaks open. Get help right away if:  You have trouble breathing.  You have chest pain.  You have increasing pain in your shoulders.  You faint or feel dizzy when you stand.  You have severe pain in your abdomen.  You have nausea or vomiting that lasts for more than one day.  You have leg pain. This information is not intended to replace advice given to you by your health care provider. Make sure you discuss any questions you have with your health care provider. Document Revised: 03/23/2017 Document Reviewed: 09/27/2015 Elsevier Patient Education  2020 [place] Anesthesia, Adult, Care After This sheet gives you information about how to care for yourself after your procedure. Your health care provider may also give you more specific instructions. If you have problems or questions, contact your health care provider. What can I expect after the procedure? After the procedure, the following side effects are common:  Pain or discomfort at the IV site.  Nausea.  Vomiting.  Sore throat.  Trouble concentrating.  Feeling cold or chills.  Weak or tired.  Sleepiness and fatigue.  Soreness and body aches. These side effects can affect parts of the body that were not involved in surgery. Follow these instructions at home:  For at least 24 hours after the procedure:  Have a responsible adult stay with you. It is important to have someone help care for you until you are awake and alert.  Rest as needed.  Do not: ? Participate in activities in which you could fall or become injured. ? Drive. ? Use heavy machinery. ? Drink alcohol. ? Take sleeping pills or  medicines that cause drowsiness. ? Make important decisions or sign legal documents. ? Take care of children on your own. Eating and drinking  Follow any instructions from your health care provider about eating or drinking restrictions.  When you feel hungry, start by eating small amounts of foods that are soft and easy to digest (bland), such as toast. Gradually return to your regular diet.  Drink enough fluid to keep your urine pale yellow.  If you vomit, rehydrate by drinking water, juice, or clear broth. General instructions  If you have sleep apnea, surgery and certain medicines can increase your risk for breathing problems. Follow instructions from your health care provider about wearing your sleep device: ? Anytime you are sleeping, including during daytime naps. ? While taking prescription pain medicines, sleeping medicines, or medicines that make  you drowsy.  Return to your normal activities as told by your health care provider. Ask your health care provider what activities are safe for you.  Take over-the-counter and prescription medicines only as told by your health care provider.  If you smoke, do not smoke without supervision.  Keep all follow-up visits as told by your health care provider. This is important. Contact a health care provider if:  You have nausea or vomiting that does not get better with medicine.  You cannot eat or drink without vomiting.  You have pain that does not get better with medicine.  You are unable to pass urine.  You develop a skin rash.  You have a fever.  You have redness around your IV site that gets worse. Get help right away if:  You have difficulty breathing.  You have chest pain.  You have blood in your urine or stool, or you vomit blood. Summary  After the procedure, it is common to have a sore throat or nausea. It is also common to feel tired.  Have a responsible adult stay with you for the first 24 hours after general  anesthesia. It is important to have someone help care for you until you are awake and alert.  When you feel hungry, start by eating small amounts of foods that are soft and easy to digest (bland), such as toast. Gradually return to your regular diet.  Drink enough fluid to keep your urine pale yellow.  Return to your normal activities as told by your health care provider. Ask your health care provider what activities are safe for you. This information is not intended to replace advice given to you by your health care provider. Make sure you discuss any questions you have with your health care provider. Document Revised: 04/13/2017 Document Reviewed: 11/24/2016 Elsevier Patient Education  [place]. How to Use Chlorhexidine for Bathing Chlorhexidine gluconate (CHG) is a germ-killing (antiseptic) solution that is used to clean the skin. It can get rid of the bacteria that normally live on the skin and can keep them away for about 24 hours. To clean your skin with CHG, you may be given:  A CHG solution to use in the shower or as part of a sponge bath.  A prepackaged cloth that contains CHG. Cleaning your skin with CHG may help lower the risk for infection:  While you are staying in the intensive care unit of the hospital.  If you have a vascular access, such as a central line, to provide short-term or long-term access to your veins.  If you have a catheter to drain urine from your bladder.  If you are on a ventilator. A ventilator is a machine that helps you breathe by moving air in and out of your lungs.  After surgery. What are the risks? Risks of using CHG include:  A skin reaction.  Hearing loss, if CHG gets in your ears.  Eye injury, if CHG gets in your eyes and is not rinsed out.  The CHG product catching fire. Make sure that you avoid smoking and flames after applying CHG to your skin. Do not use CHG:  If you have a chlorhexidine allergy or have previously reacted  to chlorhexidine.  On babies younger than 25 months of age. How to use CHG solution  Use CHG only as told by your health care provider, and follow the instructions on the label.  Use the full amount of CHG as directed. Usually, this is one  bottle. During a shower Follow these steps when using CHG solution during a shower (unless your health care provider gives you different instructions): 1. Start the shower. 2. Use your normal soap and shampoo to wash your face and hair. 3. Turn off the shower or move out of the shower stream. 4. Pour the CHG onto a clean washcloth. Do not use any type of brush or rough-edged sponge. 5. Starting at your neck, lather your body down to your toes. Make sure you follow these instructions: ? If you will be having surgery, pay special attention to the part of your body where you will be having surgery. Scrub this area for at least 1 minute. ? Do not use CHG on your head or face. If the solution gets into your ears or eyes, rinse them well with water. ? Avoid your genital area. ? Avoid any areas of skin that have broken skin, cuts, or scrapes. ? Scrub your back and under your arms. Make sure to wash skin folds. 6. Let the lather sit on your skin for 1-2 minutes or as long as told by your health care provider. 7. Thoroughly rinse your entire body in the shower. Make sure that all body creases and crevices are rinsed well. 8. Dry off with a clean towel. Do not put any substances on your body afterward--such as powder, lotion, or perfume--unless you are told to do so by your health care provider. Only use lotions that are recommended by the manufacturer. 9. Put on clean clothes or pajamas. 10. If it is the night before your surgery, sleep in clean sheets.  During a sponge bath Follow these steps when using CHG solution during a sponge bath (unless your health care provider gives you different instructions): 1. Use your normal soap and shampoo to wash your face and  hair. 2. Pour the CHG onto a clean washcloth. 3. Starting at your neck, lather your body down to your toes. Make sure you follow these instructions: ? If you will be having surgery, pay special attention to the part of your body where you will be having surgery. Scrub this area for at least 1 minute. ? Do not use CHG on your head or face. If the solution gets into your ears or eyes, rinse them well with water. ? Avoid your genital area. ? Avoid any areas of skin that have broken skin, cuts, or scrapes. ? Scrub your back and under your arms. Make sure to wash skin folds. 4. Let the lather sit on your skin for 1-2 minutes or as long as told by your health care provider. 5. Using a different clean, wet washcloth, thoroughly rinse your entire body. Make sure that all body creases and crevices are rinsed well. 6. Dry off with a clean towel. Do not put any substances on your body afterward--such as powder, lotion, or perfume--unless you are told to do so by your health care provider. Only use lotions that are recommended by the manufacturer. 7. Put on clean clothes or pajamas. 8. If it is the night before your surgery, sleep in clean sheets. How to use CHG prepackaged cloths  Only use CHG cloths as told by your health care provider, and follow the instructions on the label.  Use the CHG cloth on clean, dry skin.  Do not use the CHG cloth on your head or face unless your health care provider tells you to.  When washing with the CHG cloth: ? Avoid your genital area. ? Avoid  any areas of skin that have broken skin, cuts, or scrapes. Before surgery Follow these steps when using a CHG cloth to clean before surgery (unless your health care provider gives you different instructions): 1. Using the CHG cloth, vigorously scrub the part of your body where you will be having surgery. Scrub using a back-and-forth motion for 3 minutes. The area on your body should be completely wet with CHG when you are done  scrubbing. 2. Do not rinse. Discard the cloth and let the area air-dry. Do not put any substances on the area afterward, such as powder, lotion, or perfume. 3. Put on clean clothes or pajamas. 4. If it is the night before your surgery, sleep in clean sheets.  For general bathing Follow these steps when using CHG cloths for general bathing (unless your health care provider gives you different instructions). 1. Use a separate CHG cloth for each area of your body. Make sure you wash between any folds of skin and between your fingers and toes. Wash your body in the following order, switching to a new cloth after each step: ? The front of your neck, shoulders, and chest. ? Both of your arms, under your arms, and your hands. ? Your stomach and groin area, avoiding the genitals. ? Your right leg and foot. ? Your left leg and foot. ? The back of your neck, your back, and your buttocks. 2. Do not rinse. Discard the cloth and let the area air-dry. Do not put any substances on your body afterward--such as powder, lotion, or perfume--unless you are told to do so by your health care provider. Only use lotions that are recommended by the manufacturer. 3. Put on clean clothes or pajamas. Contact a health care provider if:  Your skin gets irritated after scrubbing.  You have questions about using your solution or cloth. Get help right away if:  Your eyes become very red or swollen.  Your eyes itch badly.  Your skin itches badly and is red or swollen.  Your hearing changes.  You have trouble seeing.  You have swelling or tingling in your mouth or throat.  You have trouble breathing.  You swallow any chlorhexidine. Summary  Chlorhexidine gluconate (CHG) is a germ-killing (antiseptic) solution that is used to clean the skin. Cleaning your skin with CHG may help to lower your risk for infection.  You may be given CHG to use for bathing. It may be in a bottle or in a prepackaged cloth to use on  your skin. Carefully follow your health care provider's instructions and the instructions on the product label.  Do not use CHG if you have a chlorhexidine allergy.  Contact your health care provider if your skin gets irritated after scrubbing. This information is not intended to replace advice given to you by your health care provider. Make sure you discuss any questions you have with your health care provider. Document Revised: 06/27/2018 Document Reviewed: 03/08/2017 Elsevier Patient Education  [place].

## 2019-09-03 ENCOUNTER — Other Ambulatory Visit (HOSPITAL_COMMUNITY)
Admission: RE | Admit: 2019-09-03 | Discharge: 2019-09-03 | Disposition: A | Discharge status: Home / Self Care | Payer: Self-pay | Source: Ambulatory Visit | Attending: General Surgery | Admitting: General Surgery

## 2019-09-03 ENCOUNTER — Encounter (HOSPITAL_COMMUNITY)
Admission: RE | Admit: 2019-09-03 | Discharge: 2019-09-03 | Disposition: A | Discharge status: Home / Self Care | Payer: Self-pay | Source: Ambulatory Visit | Attending: General Surgery | Admitting: General Surgery

## 2019-09-03 ENCOUNTER — Other Ambulatory Visit: Payer: Self-pay

## 2019-09-03 DIAGNOSIS — Z01812 Encounter for preprocedural laboratory examination: Secondary | ICD-10-CM | POA: Insufficient documentation

## 2019-09-03 LAB — CBC WITH DIFFERENTIAL/PLATELET
Abs Immature Granulocytes: 0.02 10*3/uL (ref 0.00–0.07)
Basophils Absolute: 0 10*3/uL (ref 0.0–0.1)
Basophils Relative: 1 %
Eosinophils Absolute: 0.2 10*3/uL (ref 0.0–0.5)
Eosinophils Relative: 2 %
HCT: 45.6 % (ref 39.0–52.0)
Hemoglobin: 14.4 g/dL (ref 13.0–17.0)
Immature Granulocytes: 0 %
Lymphocytes Relative: 32 %
Lymphs Abs: 2.2 10*3/uL (ref 0.7–4.0)
MCH: 30.1 pg (ref 26.0–34.0)
MCHC: 31.6 g/dL (ref 30.0–36.0)
MCV: 95.2 fL (ref 80.0–100.0)
Monocytes Absolute: 0.5 10*3/uL (ref 0.1–1.0)
Monocytes Relative: 8 %
Neutro Abs: 4 10*3/uL (ref 1.7–7.7)
Neutrophils Relative %: 57 %
Platelets: 147 10*3/uL — ABNORMAL LOW (ref 150–400)
RBC: 4.79 MIL/uL (ref 4.22–5.81)
RDW: 14.3 % (ref 11.5–15.5)
WBC: 6.9 10*3/uL (ref 4.0–10.5)
nRBC: 0 % (ref 0.0–0.2)

## 2019-09-03 LAB — COMPREHENSIVE METABOLIC PANEL
ALT: 9 U/L (ref 0–44)
AST: 18 U/L (ref 15–41)
Albumin: 4.1 g/dL (ref 3.5–5.0)
Alkaline Phosphatase: 77 U/L (ref 38–126)
Anion gap: 8 (ref 5–15)
BUN: 8 mg/dL (ref 8–23)
CO2: 24 mmol/L (ref 22–32)
Calcium: 9.2 mg/dL (ref 8.9–10.3)
Chloride: 106 mmol/L (ref 98–111)
Creatinine, Ser: 0.8 mg/dL (ref 0.61–1.24)
GFR calc Af Amer: >60 mL/min (ref >60)
GFR calc non Af Amer: >60 mL/min (ref >60)
Glucose, Bld: 97 mg/dL (ref 70–99)
Potassium: 4.3 mmol/L (ref 3.5–5.1)
Sodium: 138 mmol/L (ref 135–145)
Total Bilirubin: 0.7 mg/dL (ref 0.3–1.2)
Total Protein: 8.2 g/dL — ABNORMAL HIGH (ref 6.5–8.1)

## 2019-09-03 LAB — PROTIME-INR
INR: 1.2 (ref 0.8–1.2)
Prothrombin Time: 14.8 seconds (ref 11.4–15.2)

## 2019-09-05 ENCOUNTER — Ambulatory Visit (HOSPITAL_COMMUNITY): Payer: Self-pay | Admitting: Anesthesiology

## 2019-09-05 ENCOUNTER — Encounter (HOSPITAL_COMMUNITY): Payer: Self-pay | Admitting: General Surgery

## 2019-09-05 ENCOUNTER — Ambulatory Visit (HOSPITAL_COMMUNITY)
Admission: RE | Admit: 2019-09-05 | Discharge: 2019-09-05 | Disposition: A | Discharge status: Home / Self Care | Payer: Self-pay | Attending: General Surgery | Admitting: General Surgery

## 2019-09-05 ENCOUNTER — Encounter (HOSPITAL_COMMUNITY)
Admission: RE | Disposition: A | Discharge status: Home / Self Care | Payer: Self-pay | Source: Home / Self Care | Attending: General Surgery

## 2019-09-05 DIAGNOSIS — K828 Other specified diseases of gallbladder: Secondary | ICD-10-CM | POA: Insufficient documentation

## 2019-09-05 DIAGNOSIS — F17210 Nicotine dependence, cigarettes, uncomplicated: Secondary | ICD-10-CM | POA: Insufficient documentation

## 2019-09-05 DIAGNOSIS — K7030 Alcoholic cirrhosis of liver without ascites: Secondary | ICD-10-CM | POA: Insufficient documentation

## 2019-09-05 DIAGNOSIS — K8020 Calculus of gallbladder without cholecystitis without obstruction: Secondary | ICD-10-CM

## 2019-09-05 DIAGNOSIS — Z20822 Contact with and (suspected) exposure to covid-19: Secondary | ICD-10-CM | POA: Insufficient documentation

## 2019-09-05 DIAGNOSIS — Z8719 Personal history of other diseases of the digestive system: Secondary | ICD-10-CM | POA: Insufficient documentation

## 2019-09-05 DIAGNOSIS — Z79899 Other long term (current) drug therapy: Secondary | ICD-10-CM | POA: Insufficient documentation

## 2019-09-05 DIAGNOSIS — K8010 Calculus of gallbladder with chronic cholecystitis without obstruction: Secondary | ICD-10-CM | POA: Insufficient documentation

## 2019-09-05 DIAGNOSIS — Z8249 Family history of ischemic heart disease and other diseases of the circulatory system: Secondary | ICD-10-CM | POA: Insufficient documentation

## 2019-09-05 HISTORY — PX: CHOLECYSTECTOMY: SHX55

## 2019-09-05 LAB — SARS CORONAVIRUS 2 BY RT PCR (HOSPITAL ORDER, PERFORMED IN ~~LOC~~ HOSPITAL LAB): SARS Coronavirus 2: NEGATIVE

## 2019-09-05 SURGERY — LAPAROSCOPIC CHOLECYSTECTOMY
Anesthesia: General | Site: Abdomen

## 2019-09-05 MED ORDER — PHENYLEPHRINE 40 MCG/ML (10ML) SYRINGE FOR IV PUSH (FOR BLOOD PRESSURE SUPPORT)
PREFILLED_SYRINGE | INTRAVENOUS | Status: AC
  Filled 2019-09-05: qty 10

## 2019-09-05 MED ORDER — LACTATED RINGERS IV SOLN
Freq: Once | INTRAVENOUS | Status: AC
  Administered 2019-09-05: 1000 mL via INTRAVENOUS

## 2019-09-05 MED ORDER — FENTANYL CITRATE (PF) 100 MCG/2ML IJ SOLN
INTRAMUSCULAR | Status: AC
  Filled 2019-09-05: qty 2

## 2019-09-05 MED ORDER — ONDANSETRON HCL 4 MG/2ML IJ SOLN
INTRAMUSCULAR | Status: AC
  Filled 2019-09-05: qty 2

## 2019-09-05 MED ORDER — DEXAMETHASONE SODIUM PHOSPHATE 10 MG/ML IJ SOLN
INTRAMUSCULAR | Status: DC | PRN
Start: 2019-09-05 — End: 2019-09-05
  Administered 2019-09-05: 10 mg via INTRAVENOUS

## 2019-09-05 MED ORDER — FENTANYL CITRATE (PF) 100 MCG/2ML IJ SOLN
INTRAMUSCULAR | Status: DC | PRN
  Administered 2019-09-05 (×2): 100 ug via INTRAVENOUS

## 2019-09-05 MED ORDER — SODIUM CHLORIDE 0.9 % IR SOLN
Status: DC | PRN
  Administered 2019-09-05: 1000 mL

## 2019-09-05 MED ORDER — ROCURONIUM BROMIDE 10 MG/ML (PF) SYRINGE
PREFILLED_SYRINGE | INTRAVENOUS | Status: AC
  Filled 2019-09-05: qty 10

## 2019-09-05 MED ORDER — BUPIVACAINE LIPOSOME 1.3 % IJ SUSP
INTRAMUSCULAR | Status: DC | PRN
  Administered 2019-09-05: 20 mL

## 2019-09-05 MED ORDER — CHLORHEXIDINE GLUCONATE CLOTH 2 % EX PADS: 6.0000 | MEDICATED_PAD | Freq: Once | CUTANEOUS | Status: DC

## 2019-09-05 MED ORDER — PROMETHAZINE HCL 25 MG/ML IJ SOLN: 6.2500 mg | INTRAMUSCULAR | Status: DC | PRN

## 2019-09-05 MED ORDER — BUPIVACAINE LIPOSOME 1.3 % IJ SUSP
INTRAMUSCULAR | Status: AC
  Filled 2019-09-05: qty 10

## 2019-09-05 MED ORDER — MEPERIDINE HCL 50 MG/ML IJ SOLN: 6.2500 mg | INTRAMUSCULAR | Status: DC | PRN

## 2019-09-05 MED ORDER — LACTATED RINGERS IV SOLN
INTRAVENOUS | Status: DC | PRN
Start: 2019-09-05 — End: 2019-09-05

## 2019-09-05 MED ORDER — SUGAMMADEX SODIUM 200 MG/2ML IV SOLN
INTRAVENOUS | Status: DC | PRN
Start: 2019-09-05 — End: 2019-09-05
  Administered 2019-09-05: 200 mg via INTRAVENOUS

## 2019-09-05 MED ORDER — OXYCODONE HCL 5 MG PO TABS: 5.0000 mg | ORAL_TABLET | ORAL | 0 refills | Status: DC | PRN

## 2019-09-05 MED ORDER — PROPOFOL 10 MG/ML IV BOLUS
INTRAVENOUS | Status: AC
  Filled 2019-09-05: qty 20

## 2019-09-05 MED ORDER — MIDAZOLAM HCL 5 MG/5ML IJ SOLN
INTRAMUSCULAR | Status: DC | PRN
  Administered 2019-09-05: 2 mg via INTRAVENOUS

## 2019-09-05 MED ORDER — PHENYLEPHRINE HCL (PRESSORS) 10 MG/ML IV SOLN
INTRAVENOUS | Status: DC | PRN
  Administered 2019-09-05 (×3): 40 ug via INTRAVENOUS

## 2019-09-05 MED ORDER — HYDROMORPHONE HCL 1 MG/ML IJ SOLN
0.2500 mg | INTRAMUSCULAR | Status: DC | PRN
  Administered 2019-09-05 (×2): 0.5 mg via INTRAVENOUS
  Filled 2019-09-05 (×2): qty 0.5

## 2019-09-05 MED ORDER — DEXAMETHASONE SODIUM PHOSPHATE 10 MG/ML IJ SOLN
INTRAMUSCULAR | Status: AC
  Filled 2019-09-05: qty 1

## 2019-09-05 MED ORDER — KETOROLAC TROMETHAMINE 30 MG/ML IJ SOLN
30.0000 mg | Freq: Once | INTRAMUSCULAR | Status: AC
  Administered 2019-09-05: 30 mg via INTRAVENOUS

## 2019-09-05 MED ORDER — KETOROLAC TROMETHAMINE 30 MG/ML IJ SOLN
INTRAMUSCULAR | Status: AC
  Filled 2019-09-05: qty 1

## 2019-09-05 MED ORDER — LIDOCAINE 2% (20 MG/ML) 5 ML SYRINGE
INTRAMUSCULAR | Status: AC
  Filled 2019-09-05: qty 5

## 2019-09-05 MED ORDER — ROCURONIUM BROMIDE 10 MG/ML (PF) SYRINGE
PREFILLED_SYRINGE | INTRAVENOUS | Status: DC | PRN
  Administered 2019-09-05: 40 mg via INTRAVENOUS

## 2019-09-05 MED ORDER — PROPOFOL 10 MG/ML IV BOLUS
INTRAVENOUS | Status: DC | PRN
  Administered 2019-09-05: 200 mg via INTRAVENOUS

## 2019-09-05 MED ORDER — ONDANSETRON HCL 4 MG/2ML IJ SOLN
INTRAMUSCULAR | Status: DC | PRN
  Administered 2019-09-05: 4 mg via INTRAVENOUS

## 2019-09-05 MED ORDER — MIDAZOLAM HCL 2 MG/2ML IJ SOLN
INTRAMUSCULAR | Status: AC
  Filled 2019-09-05: qty 2

## 2019-09-05 MED ORDER — HEMOSTATIC AGENTS (NO CHARGE) OPTIME
TOPICAL | Status: DC | PRN
  Administered 2019-09-05: 1 via TOPICAL

## 2019-09-05 MED ORDER — CIPROFLOXACIN IN D5W 400 MG/200ML IV SOLN
400.0000 mg | INTRAVENOUS | Status: AC
  Administered 2019-09-05: 400 mg via INTRAVENOUS

## 2019-09-05 MED ORDER — CIPROFLOXACIN IN D5W 400 MG/200ML IV SOLN
INTRAVENOUS | Status: AC
  Filled 2019-09-05: qty 200

## 2019-09-05 SURGICAL SUPPLY — 49 items
APPLIER CLIP ROT 10 11.4 M/L (STAPLE) ×3
BAG RETRIEVAL 10 (BASKET) ×1
BAG RETRIEVAL 10MM (BASKET) ×1
CHLORAPREP W/TINT 26 (MISCELLANEOUS) ×3 IMPLANT
CLIP APPLIE ROT 10 11.4 M/L (STAPLE) ×1 IMPLANT
CLOTH BEACON ORANGE TIMEOUT ST (SAFETY) ×3 IMPLANT
COVER LIGHT HANDLE STERIS (MISCELLANEOUS) ×6 IMPLANT
COVER WAND RF STERILE (DRAPES) ×3 IMPLANT
CUTTER FLEX LINEAR 45M (STAPLE) ×3 IMPLANT
DERMABOND ADVANCED (GAUZE/BANDAGES/DRESSINGS) ×2
DERMABOND ADVANCED .7 DNX12 (GAUZE/BANDAGES/DRESSINGS) ×1 IMPLANT
DISSECTOR BLUNT TIP ENDO 5MM (MISCELLANEOUS) ×3 IMPLANT
ELECT REM PT RETURN 9FT ADLT (ELECTROSURGICAL) ×3
ELECTRODE REM PT RTRN 9FT ADLT (ELECTROSURGICAL) ×1 IMPLANT
GLOVE BIO SURGEON STRL SZ 6.5 (GLOVE) ×4 IMPLANT
GLOVE BIO SURGEONS STRL SZ 6.5 (GLOVE) ×2
GLOVE BIOGEL PI IND STRL 6.5 (GLOVE) ×2 IMPLANT
GLOVE BIOGEL PI IND STRL 7.0 (GLOVE) ×2 IMPLANT
GLOVE BIOGEL PI INDICATOR 6.5 (GLOVE) ×4
GLOVE BIOGEL PI INDICATOR 7.0 (GLOVE) ×4
GLOVE SURG SS PI 7.5 STRL IVOR (GLOVE) ×3 IMPLANT
GOWN STRL REUS W/ TWL LRG LVL3 (GOWN DISPOSABLE) ×1 IMPLANT
GOWN STRL REUS W/TWL LRG LVL3 (GOWN DISPOSABLE) ×11 IMPLANT
HEMOSTAT SNOW SURGICEL 2X4 (HEMOSTASIS) ×3 IMPLANT
INST SET LAPROSCOPIC AP (KITS) ×3 IMPLANT
KIT TURNOVER KIT A (KITS) ×3 IMPLANT
LIGASURE LAP ATLAS 10MM 37CM (INSTRUMENTS) ×3 IMPLANT
MANIFOLD NEPTUNE II (INSTRUMENTS) ×3 IMPLANT
NEEDLE HYPO 18GX1.5 BLUNT FILL (NEEDLE) ×3 IMPLANT
NEEDLE HYPO 22GX1.5 SAFETY (NEEDLE) ×3 IMPLANT
NEEDLE INSUFFLATION 14GA 120MM (NEEDLE) ×3 IMPLANT
NS IRRIG 1000ML POUR BTL (IV SOLUTION) ×3 IMPLANT
PACK LAP CHOLE LZT030E (CUSTOM PROCEDURE TRAY) ×3 IMPLANT
PAD ARMBOARD 7.5X6 YLW CONV (MISCELLANEOUS) ×3 IMPLANT
RELOAD 45 VASCULAR/THIN (ENDOMECHANICALS) ×3 IMPLANT
SET BASIN LINEN APH (SET/KITS/TRAYS/PACK) ×3 IMPLANT
SET TUBE SMOKE EVAC HIGH FLOW (TUBING) ×3 IMPLANT
SLEEVE ENDOPATH XCEL 5M (ENDOMECHANICALS) ×3 IMPLANT
SUT MNCRL AB 4-0 PS2 18 (SUTURE) ×9 IMPLANT
SUT VICRYL 0 UR6 27IN ABS (SUTURE) ×3 IMPLANT
SYR 20ML LL LF (SYRINGE) ×6 IMPLANT
SYS BAG RETRIEVAL 10MM (BASKET) ×1
SYSTEM BAG RETRIEVAL 10MM (BASKET) ×1 IMPLANT
TROCAR ENDO BLADELESS 11MM (ENDOMECHANICALS) ×3 IMPLANT
TROCAR XCEL NON-BLD 5MMX100MML (ENDOMECHANICALS) ×3 IMPLANT
TROCAR XCEL UNIV SLVE 11M 100M (ENDOMECHANICALS) ×3 IMPLANT
TUBE CONNECTING 12'X1/4 (SUCTIONS) ×1
TUBE CONNECTING 12X1/4 (SUCTIONS) ×2 IMPLANT
WARMER LAPAROSCOPE (MISCELLANEOUS) ×3 IMPLANT

## 2019-09-05 NOTE — Anesthesia Procedure Notes (Signed)
Procedure Name: Intubation Date/Time: 09/05/2019 8:43 AM Performed by: Alvy Bimler, CRNA Pre-anesthesia Checklist: Patient identified, Emergency Drugs available, Suction available and Patient being monitored Patient Re-evaluated:Patient Re-evaluated prior to induction Oxygen Delivery Method: Circle system utilized Preoxygenation: Pre-oxygenation with 100% oxygen Induction Type: IV induction Ventilation: Mask ventilation without difficulty Laryngoscope Size: Mac and 3 Grade View: Grade II Tube type: Oral Tube size: 8.0 mm Number of attempts: 1 Airway Equipment and Method: Stylet Placement Confirmation: ETT inserted through vocal cords under direct vision,  positive ETCO2 and breath sounds checked- equal and bilateral Secured at: 21 (right lip) cm Tube secured with: Tape Dental Injury: Teeth and Oropharynx as per pre-operative assessment

## 2019-09-05 NOTE — Op Note (Signed)
Patient:  Tanner Thomas  DOB:  03-09-1956  MRN:  [id number]   Preop Diagnosis: Biliary colic, cholelithiasis, history of pancreatitis  Postop Diagnosis: Same  Procedure: Laparoscopic cholecystectomy  Surgeon: Aviva Signs, MD  Anes: General endotracheal  Indications: Patient is a 64 year old black male with a history of alcoholic hepatitis who has been followed by GI for a 15 mm common bile duct.  It is felt that this may be secondary to biliary sludge and his history of pancreatitis.  Does have stones in his gallbladder and prior to ERCP intervention by Dr. Ardis Hughs, I have been requested to take out the gallbladder.  The risks and benefits of the procedure including bleeding, infection, hepatobiliary injury, and the possibility of an open procedure were fully explained to the patient, who gave informed consent.  Procedure note: The patient was placed in supine position.  After induction of general endotracheal anesthesia, the abdomen was prepped and draped using usual sterile technique with ChloraPrep.  Surgical site confirmation was performed.  An infraumbilical incision was made down to the fascia.  A Veress needle was introduced into the abdominal cavity and confirmation of placement was done using the saline drop test.  The abdomen was then insufflated to 15 mmHg pressure.  An 11 mm trocar was introduced into the abdominal cavity under direct visualization without difficulty.  The patient was placed in reverse Trendelenburg position and an additional level meter trocar was placed in the epigastric region and 5 mm trochars were placed the right upper quadrant and right flank regions.  The liver was inspected and no significant nodular cirrhosis was seen.  The stomach was adhesed up over the collapsed gallbladder.  These adhesions were taken down using the LigaSure.  The gallbladder was then retracted superiorly in a dynamic fashion in order to provide a critical view of the triangle of Coelho.   A retrograde dome down approach was needed due to significant thick adhesive disease.  I was able to localize the cystic artery and this was ligated and divided without difficulty.  Once I was able to isolate the cystic duct.  I was able to see its juncture to the common bile duct.  It was enlarged.  A vascular Endo GIA was placed across the cystic duct and fired.  The gallbladder was then removed using an Endo Catch bag without difficulty.  The staple line was inspected and noted to be within normal limits.  Surgicel snow was placed in the gallbladder fossa.  All fluid and air were then evacuated from the abdominal cavity prior to removal of the trochars.  All wounds were irrigated with normal saline.  All wounds were injected with Exparel.  The infraumbilical fascia as well as epigastric fascia were reapproximated using 0 Vicryl interrupted sutures.  All skin incisions were closed using a 4-0 Monocryl subcuticular suture.  Dermabond was applied.  All tape and needle counts were correct at the end of the procedure.  The patient was extubated in the operating room and transferred to PACU in stable condition.  Complications: None  EBL: Minimal  Specimen: Gallbladder

## 2019-09-05 NOTE — Discharge Instructions (Signed)
PLEASE WEAR TEAL EXPAREL BRACELET UNTIL Tuesday MAY 18TH,2021, THEN REMOVE  DO NOT USE ANY OTHER NUMBING MEDICATIONS WITHOUT CONSULTING A PHYSICIAN     Laparoscopic Cholecystectomy, Care After This sheet gives you information about how to care for yourself after your procedure. Your health care provider may also give you more specific instructions. If you have problems or questions, contact your health care provider. What can I expect after the procedure? After the procedure, it is common to have:  Pain at your incision sites. You will be given medicines to control this pain.  Mild nausea or vomiting.  Bloating and possible shoulder pain from the air-like gas that was used during the procedure. Follow these instructions at home: Incision care   Follow instructions from your health care provider about how to take care of your incisions. Make sure you: ? Wash your hands with soap and water before you change your bandage (dressing). If soap and water are not available, use hand sanitizer. ? Change your dressing as told by your health care provider. ? Leave stitches (sutures), skin glue, or adhesive strips in place. These skin closures may need to be in place for 2 weeks or longer. If adhesive strip edges start to loosen and curl up, you may trim the loose edges. Do not remove adhesive strips completely unless your health care provider tells you to do that.  Do not take baths, swim, or use a hot tub until your health care provider approves. Ask your health care provider if you can take showers. You may only be allowed to take sponge baths for bathing.  Check your incision area every day for signs of infection. Check for: ? More redness, swelling, or pain. ? More fluid or blood. ? Warmth. ? Pus or a bad smell. Activity  Do not drive or use heavy machinery while taking prescription pain medicine.  Do not lift anything that is heavier than 10 lb (4.5 kg) until your health care provider  approves.  Do not play contact sports until your health care provider approves.  Do not drive for 24 hours if you were given a medicine to help you relax (sedative).  Rest as needed. Do not return to work or school until your health care provider approves. General instructions  Take over-the-counter and prescription medicines only as told by your health care provider.  To prevent or treat constipation while you are taking prescription pain medicine, your health care provider may recommend that you: ? Drink enough fluid to keep your urine clear or pale yellow. ? Take over-the-counter or prescription medicines. ? Eat foods that are high in fiber, such as fresh fruits and vegetables, whole grains, and beans. ? Limit foods that are high in fat and processed sugars, such as fried and sweet foods. Contact a health care provider if:  You develop a rash.  You have more redness, swelling, or pain around your incisions.  You have more fluid or blood coming from your incisions.  Your incisions feel warm to the touch.  You have pus or a bad smell coming from your incisions.  You have a fever.  One or more of your incisions breaks open. Get help right away if:  You have trouble breathing.  You have chest pain.  You have increasing pain in your shoulders.  You faint or feel dizzy when you stand.  You have severe pain in your abdomen.  You have nausea or vomiting that lasts for more than one day.  You  have leg pain. This information is not intended to replace advice given to you by your health care provider. Make sure you discuss any questions you have with your health care provider. Document Revised: 03/23/2017 Document Reviewed: 09/27/2015 Elsevier Patient Education  2020 [place] Anesthesia, Adult, Care After This sheet gives you information about how to care for yourself after your procedure. Your health care provider may also give you more specific  instructions. If you have problems or questions, contact your health care provider. What can I expect after the procedure? After the procedure, the following side effects are common:  Pain or discomfort at the IV site.  Nausea.  Vomiting.  Sore throat.  Trouble concentrating.  Feeling cold or chills.  Weak or tired.  Sleepiness and fatigue.  Soreness and body aches. These side effects can affect parts of the body that were not involved in surgery. Follow these instructions at home:  For at least 24 hours after the procedure:  Have a responsible adult stay with you. It is important to have someone help care for you until you are awake and alert.  Rest as needed.  Do not: ? Participate in activities in which you could fall or become injured. ? Drive. ? Use heavy machinery. ? Drink alcohol. ? Take sleeping pills or medicines that cause drowsiness. ? Make important decisions or sign legal documents. ? Take care of children on your own. Eating and drinking  Follow any instructions from your health care provider about eating or drinking restrictions.  When you feel hungry, start by eating small amounts of foods that are soft and easy to digest (bland), such as toast. Gradually return to your regular diet.  Drink enough fluid to keep your urine pale yellow.  If you vomit, rehydrate by drinking water, juice, or clear broth. General instructions  If you have sleep apnea, surgery and certain medicines can increase your risk for breathing problems. Follow instructions from your health care provider about wearing your sleep device: ? Anytime you are sleeping, including during daytime naps. ? While taking prescription pain medicines, sleeping medicines, or medicines that make you drowsy.  Return to your normal activities as told by your health care provider. Ask your health care provider what activities are safe for you.  Take over-the-counter and prescription medicines only  as told by your health care provider.  If you smoke, do not smoke without supervision.  Keep all follow-up visits as told by your health care provider. This is important. Contact a health care provider if:  You have nausea or vomiting that does not get better with medicine.  You cannot eat or drink without vomiting.  You have pain that does not get better with medicine.  You are unable to pass urine.  You develop a skin rash.  You have a fever.  You have redness around your IV site that gets worse. Get help right away if:  You have difficulty breathing.  You have chest pain.  You have blood in your urine or stool, or you vomit blood. Summary  After the procedure, it is common to have a sore throat or nausea. It is also common to feel tired.  Have a responsible adult stay with you for the first 24 hours after general anesthesia. It is important to have someone help care for you until you are awake and alert.  When you feel hungry, start by eating small amounts of foods that are soft and  easy to digest (bland), such as toast. Gradually return to your regular diet.  Drink enough fluid to keep your urine pale yellow.  Return to your normal activities as told by your health care provider. Ask your health care provider what activities are safe for you. This information is not intended to replace advice given to you by your health care provider. Make sure you discuss any questions you have with your health care provider. Document Revised: 04/13/2017 Document Reviewed: 11/24/2016 Elsevier Patient Education  [place].

## 2019-09-05 NOTE — Anesthesia Preprocedure Evaluation (Signed)
Anesthesia Evaluation  Patient identified by MRN, date of birth, ID band Patient awake    Reviewed: Allergy & Precautions, NPO status , Patient's Chart, lab work & pertinent test results  History of Anesthesia Complications Negative for: history of anesthetic complications  Airway Mallampati: II  TM Distance: >3 FB Neck ROM: Full    Dental  (+) Missing, Chipped,    Pulmonary Current Smoker and Patient abstained from smoking.,    Pulmonary exam normal breath sounds clear to auscultation       Cardiovascular Exercise Tolerance: Good Normal cardiovascular exam Rhythm:Regular Rate:Normal     Neuro/Psych negative neurological ROS  negative psych ROS   GI/Hepatic negative GI ROS, (+)     substance abuse  alcohol use, 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   Endo/Other  negative endocrine ROS  Renal/GU negative Renal ROS  negative genitourinary   Musculoskeletal negative musculoskeletal ROS (+)   Abdominal   Peds negative pediatric ROS (+)  Hematology  (+) anemia ,   Anesthesia Other Findings   Reproductive/Obstetrics negative OB ROS                            Anesthesia Physical Anesthesia Plan  ASA: III  Anesthesia Plan: General   Post-op Pain Management:    Induction: Intravenous  PONV Risk Score and Plan: 4 or greater and Ondansetron, Dexamethasone and Midazolam  Airway Management Planned: Oral ETT  Additional Equipment:   Intra-op Plan:   Post-operative Plan: Extubation in OR  Informed Consent: I have reviewed the patients History and Physical, chart, labs and discussed the procedure including the risks, benefits and alternatives for the proposed anesthesia with the patient or authorized representative who has indicated his/her understanding and acceptance.     Dental advisory given  Plan Discussed with: CRNA and Surgeon  Anesthesia Plan Comments:         Anesthesia  Quick Evaluation

## 2019-09-05 NOTE — Interval H&P Note (Signed)
History and Physical Interval Note:  09/05/2019 8:08 AM  Tanner Thomas  has presented today for surgery, with the diagnosis of Cholelithiasis.  The various methods of treatment have been discussed with the patient and family. After consideration of risks, benefits and other options for treatment, the patient has consented to  Procedure(s): LAPAROSCOPIC CHOLECYSTECTOMY (N/A) as a surgical intervention.  The patient's history has been reviewed, patient examined, no change in status, stable for surgery.  I have reviewed the patient's chart and labs.  Questions were answered to the patient's satisfaction.     Aviva Signs

## 2019-09-05 NOTE — Anesthesia Postprocedure Evaluation (Signed)
Anesthesia Post Note  Patient: Tanner Thomas  Procedure(s) Performed: LAPAROSCOPIC CHOLECYSTECTOMY (N/A Abdomen)  Patient location during evaluation: PACU Anesthesia Type: General Level of consciousness: awake, awake and alert and oriented Pain management: pain level controlled Vital Signs Assessment: post-procedure vital signs reviewed and stable Respiratory status: spontaneous breathing and respiratory function stable Cardiovascular status: blood pressure returned to baseline and stable Postop Assessment: no apparent nausea or vomiting Anesthetic complications: no     Last Vitals:  Vitals:   09/05/19 1100 09/05/19 1114  BP: 134/80 134/81  Pulse: (!) 57 60  Resp: 12 16  Temp:  36.4 C  SpO2: 100% 98%    Last Pain:  Vitals:   09/05/19 1114  TempSrc: Axillary  PainSc: 3                  Mckale Haffey C Ahmar Pickrell

## 2019-09-05 NOTE — Transfer of Care (Signed)
Immediate Anesthesia Transfer of Care Note  Patient: Tanner Thomas  Procedure(s) Performed: LAPAROSCOPIC CHOLECYSTECTOMY (N/A Abdomen)  Patient Location: PACU  Anesthesia Type:General  Level of Consciousness: awake, alert , oriented and patient cooperative  Airway & Oxygen Therapy: Patient Spontanous Breathing and Patient connected to face mask oxygen  Post-op Assessment: Report given to RN, Post -op Vital signs reviewed and stable and Patient moving all extremities X 4  Post vital signs: Reviewed and stable  Last Vitals:  Vitals Value Taken Time  BP 127/78 09/05/19 1005  Temp    Pulse    Resp 11 09/05/19 1006  SpO2    Vitals shown include unvalidated device data.  Last Pain:  Vitals:   09/05/19 0741  TempSrc: Oral  PainSc: 0-No pain      Patients Stated Pain Goal: 9 ([id number])  Complications: No apparent anesthesia complications

## 2019-09-08 LAB — SURGICAL PATHOLOGY

## 2019-09-12 ENCOUNTER — Telehealth: Payer: Self-pay | Admitting: General Surgery

## 2019-09-15 ENCOUNTER — Telehealth: Payer: Self-pay

## 2019-09-15 NOTE — Telephone Encounter (Signed)
Patient called wanting to schedule an appointment. After talking with Arbie Cookey and listening to the patient, I offered to schedule him, but since he hasn't gotten the [facility] Application filled out and turned in he would have to bring some money to his appointment or be rescheduled. He chose to wait and fill out application and go from there. I told him that as soon as they approve his application to please call us and we will schedule him.

## 2019-09-20 ENCOUNTER — Other Ambulatory Visit (HOSPITAL_COMMUNITY): Payer: Self-pay

## 2019-09-23 ENCOUNTER — Ambulatory Visit: Payer: HRSA Program | Attending: Internal Medicine

## 2019-09-23 ENCOUNTER — Other Ambulatory Visit: Payer: Self-pay

## 2019-09-23 DIAGNOSIS — Z20822 Contact with and (suspected) exposure to covid-19: Secondary | ICD-10-CM | POA: Diagnosis present

## 2019-09-24 ENCOUNTER — Telehealth: Payer: Self-pay | Admitting: Gastroenterology

## 2019-09-24 ENCOUNTER — Other Ambulatory Visit (HOSPITAL_COMMUNITY): Payer: Self-pay

## 2019-09-24 LAB — SARS-COV-2, NAA 2 DAY TAT

## 2019-09-24 LAB — NOVEL CORONAVIRUS, NAA: SARS-CoV-2, NAA: NOT DETECTED

## 2019-09-24 NOTE — Telephone Encounter (Signed)
See message below, please advise.

## 2019-09-24 NOTE — Telephone Encounter (Signed)
Prior to speaking with patient I called the [place] [facility] line to see how long it would take for pts results, Tanner Thomas stated that it takes about 2-5 days to get results back.  Spoke with patient, pt stated that he had his COVID test done at [facility] in [place] yesterday afternoon because he missed his appointment in [place]. Pt advised that appointment for tomorrow had to be cancelled and will be rescheduled. Pt gave verbal permission to contact his sister with this information as well. Spoke with Rodena Piety, advised that procedure was cancelled for tomorrow and will be rescheduled and depending on date of appointment pt will need to have a repeat COVID test done prior to new appointment.

## 2019-09-24 NOTE — Telephone Encounter (Signed)
OK, thanks  Tanner Thomas, Please offer him my next available Thursday ERCP availability.

## 2019-09-24 NOTE — Telephone Encounter (Signed)
Calling back to check on status. Wants to know if it will be cancelled so she does not miss work if possible. Sister Rodena Piety [phone].

## 2019-09-24 NOTE — Telephone Encounter (Signed)
Can you please call [facility] endo and see what their current policy is.  He had neg covid test 2 weeks ago, 2 months ago.  Not sure if he's been vaccinated but that would be helpful to know as well.  It is elective and so if they will not allow the case it will have to be rescheduled.  Let me know.   Thanks

## 2019-09-25 ENCOUNTER — Encounter (HOSPITAL_COMMUNITY): Admission: RE | Payer: Self-pay | Source: Ambulatory Visit

## 2019-09-25 ENCOUNTER — Ambulatory Visit (HOSPITAL_COMMUNITY): Admission: RE | Admit: 2019-09-25 | Payer: Self-pay | Source: Ambulatory Visit | Admitting: Gastroenterology

## 2019-09-25 SURGERY — ENDOSCOPIC RETROGRADE CHOLANGIOPANCREATOGRAPHY (ERCP) WITH PROPOFOL
Anesthesia: General

## 2019-09-26 NOTE — Telephone Encounter (Signed)
Left message on machine to call back  

## 2019-09-29 NOTE — Telephone Encounter (Signed)
Left message on machine to call back  

## 2019-09-30 NOTE — Telephone Encounter (Signed)
Left message on machine to call back  

## 2019-10-06 ENCOUNTER — Other Ambulatory Visit: Payer: Self-pay

## 2019-10-06 DIAGNOSIS — K838 Other specified diseases of biliary tract: Secondary | ICD-10-CM

## 2019-10-06 NOTE — Telephone Encounter (Signed)
Left message on machine to call back  

## 2019-10-06 NOTE — Telephone Encounter (Signed)
The pt has been scheduled for 10/30/19 at [facility] for ERCP with Dr Ardis Hughs.  COVID test on 7/6. I spoke with the pt sister who is the caregiver and she is aware of the appt and I will mail to the home.

## 2019-10-24 ENCOUNTER — Telehealth: Payer: Self-pay | Admitting: Gastroenterology

## 2019-10-24 NOTE — Telephone Encounter (Signed)
Renee from [employer] calling inquiring about CPT code for ERCP that pt is scheduled for. Pls call her at [phone], ext. [phone]

## 2019-10-24 NOTE — Telephone Encounter (Signed)
55001 is the correct CPT code.  I added an extra digit in error.  This has been corrected.

## 2019-10-28 ENCOUNTER — Other Ambulatory Visit (HOSPITAL_COMMUNITY)
Admission: RE | Admit: 2019-10-28 | Discharge: 2019-10-28 | Disposition: A | Discharge status: Home / Self Care | Payer: HRSA Program | Source: Ambulatory Visit | Attending: Gastroenterology | Admitting: Gastroenterology

## 2019-10-28 DIAGNOSIS — Z20822 Contact with and (suspected) exposure to covid-19: Secondary | ICD-10-CM | POA: Insufficient documentation

## 2019-10-28 DIAGNOSIS — Z01812 Encounter for preprocedural laboratory examination: Secondary | ICD-10-CM | POA: Insufficient documentation

## 2019-10-28 LAB — SARS CORONAVIRUS 2 (TAT 6-24 HRS): SARS Coronavirus 2: NEGATIVE

## 2019-10-30 ENCOUNTER — Encounter (HOSPITAL_COMMUNITY): Payer: Self-pay | Admitting: Gastroenterology

## 2019-10-30 ENCOUNTER — Ambulatory Visit (HOSPITAL_COMMUNITY): Payer: Self-pay | Admitting: Certified Registered"

## 2019-10-30 ENCOUNTER — Encounter (HOSPITAL_COMMUNITY)
Admission: RE | Disposition: A | Discharge status: Home / Self Care | Payer: Self-pay | Source: Home / Self Care | Attending: Gastroenterology

## 2019-10-30 ENCOUNTER — Ambulatory Visit (HOSPITAL_COMMUNITY): Payer: Self-pay

## 2019-10-30 ENCOUNTER — Other Ambulatory Visit: Payer: Self-pay

## 2019-10-30 ENCOUNTER — Ambulatory Visit (HOSPITAL_COMMUNITY)
Admission: RE | Admit: 2019-10-30 | Discharge: 2019-10-30 | Disposition: A | Discharge status: Home / Self Care | Payer: Self-pay | Attending: Gastroenterology | Admitting: Gastroenterology

## 2019-10-30 DIAGNOSIS — F17210 Nicotine dependence, cigarettes, uncomplicated: Secondary | ICD-10-CM | POA: Insufficient documentation

## 2019-10-30 DIAGNOSIS — K860 Alcohol-induced chronic pancreatitis: Secondary | ICD-10-CM | POA: Insufficient documentation

## 2019-10-30 DIAGNOSIS — K8020 Calculus of gallbladder without cholecystitis without obstruction: Secondary | ICD-10-CM

## 2019-10-30 DIAGNOSIS — K8050 Calculus of bile duct without cholangitis or cholecystitis without obstruction: Secondary | ICD-10-CM | POA: Insufficient documentation

## 2019-10-30 DIAGNOSIS — K838 Other specified diseases of biliary tract: Secondary | ICD-10-CM

## 2019-10-30 HISTORY — PX: BILIARY DILATION: SHX6850

## 2019-10-30 HISTORY — PX: ENDOSCOPIC RETROGRADE CHOLANGIOPANCREATOGRAPHY (ERCP) WITH PROPOFOL: SHX5810

## 2019-10-30 HISTORY — PX: REMOVAL OF STONES: SHX5545

## 2019-10-30 HISTORY — PX: BILIARY STENT PLACEMENT: SHX5538

## 2019-10-30 HISTORY — PX: SPHINCTEROTOMY: SHX5544

## 2019-10-30 SURGERY — ENDOSCOPIC RETROGRADE CHOLANGIOPANCREATOGRAPHY (ERCP) WITH PROPOFOL
Anesthesia: General

## 2019-10-30 MED ORDER — LIDOCAINE 2% (20 MG/ML) 5 ML SYRINGE
INTRAMUSCULAR | Status: DC | PRN
  Administered 2019-10-30: 80 mg via INTRAVENOUS

## 2019-10-30 MED ORDER — ROCURONIUM BROMIDE 10 MG/ML (PF) SYRINGE
PREFILLED_SYRINGE | INTRAVENOUS | Status: DC | PRN
  Administered 2019-10-30: 60 mg via INTRAVENOUS
  Administered 2019-10-30: 10 mg via INTRAVENOUS

## 2019-10-30 MED ORDER — MIDAZOLAM HCL 2 MG/2ML IJ SOLN
INTRAMUSCULAR | Status: AC
  Filled 2019-10-30: qty 2

## 2019-10-30 MED ORDER — SODIUM CHLORIDE 0.9 % IV SOLN: INTRAVENOUS | Status: DC

## 2019-10-30 MED ORDER — PHENYLEPHRINE 40 MCG/ML (10ML) SYRINGE FOR IV PUSH (FOR BLOOD PRESSURE SUPPORT)
PREFILLED_SYRINGE | INTRAVENOUS | Status: DC | PRN
  Administered 2019-10-30 (×2): 80 ug via INTRAVENOUS

## 2019-10-30 MED ORDER — PROPOFOL 10 MG/ML IV BOLUS
INTRAVENOUS | Status: DC | PRN
  Administered 2019-10-30: 160 mg via INTRAVENOUS

## 2019-10-30 MED ORDER — ONDANSETRON HCL 4 MG/2ML IJ SOLN
INTRAMUSCULAR | Status: DC | PRN
  Administered 2019-10-30: 4 mg via INTRAVENOUS

## 2019-10-30 MED ORDER — SODIUM CHLORIDE 0.9 % IV SOLN
INTRAVENOUS | Status: DC | PRN
  Administered 2019-10-30: 30 mL

## 2019-10-30 MED ORDER — FENTANYL CITRATE (PF) 100 MCG/2ML IJ SOLN
INTRAMUSCULAR | Status: DC | PRN
  Administered 2019-10-30 (×2): 50 ug via INTRAVENOUS

## 2019-10-30 MED ORDER — SUGAMMADEX SODIUM 200 MG/2ML IV SOLN
INTRAVENOUS | Status: DC | PRN
  Administered 2019-10-30: 150 mg via INTRAVENOUS

## 2019-10-30 MED ORDER — PROPOFOL 10 MG/ML IV BOLUS
INTRAVENOUS | Status: AC
  Filled 2019-10-30: qty 20

## 2019-10-30 MED ORDER — LACTATED RINGERS IV SOLN
INTRAVENOUS | Status: AC | PRN
  Administered 2019-10-30: 1000 mL via INTRAVENOUS

## 2019-10-30 MED ORDER — MIDAZOLAM HCL 5 MG/5ML IJ SOLN
INTRAMUSCULAR | Status: DC | PRN
  Administered 2019-10-30: 2 mg via INTRAVENOUS

## 2019-10-30 MED ORDER — INDOMETHACIN 50 MG RE SUPP
RECTAL | Status: AC
  Filled 2019-10-30: qty 2

## 2019-10-30 MED ORDER — FENTANYL CITRATE (PF) 100 MCG/2ML IJ SOLN
INTRAMUSCULAR | Status: AC
  Filled 2019-10-30: qty 2

## 2019-10-30 MED ORDER — GLUCAGON HCL RDNA (DIAGNOSTIC) 1 MG IJ SOLR
INTRAMUSCULAR | Status: AC
  Filled 2019-10-30: qty 1

## 2019-10-30 MED ORDER — CIPROFLOXACIN IN D5W 400 MG/200ML IV SOLN
INTRAVENOUS | Status: AC
  Filled 2019-10-30: qty 200

## 2019-10-30 MED ORDER — INDOMETHACIN 50 MG RE SUPP
RECTAL | Status: DC | PRN
  Administered 2019-10-30: 100 mg via RECTAL

## 2019-10-30 MED ORDER — DEXAMETHASONE SODIUM PHOSPHATE 10 MG/ML IJ SOLN
INTRAMUSCULAR | Status: DC | PRN
  Administered 2019-10-30: 10 mg via INTRAVENOUS

## 2019-10-30 MED ORDER — CIPROFLOXACIN IN D5W 400 MG/200ML IV SOLN
INTRAVENOUS | Status: DC | PRN
Start: 2019-10-30 — End: 2019-10-30
  Administered 2019-10-30: 400 mg via INTRAVENOUS

## 2019-10-30 NOTE — Transfer of Care (Signed)
Immediate Anesthesia Transfer of Care Note  Patient: Tanner Thomas  Procedure(s) Performed: ENDOSCOPIC RETROGRADE CHOLANGIOPANCREATOGRAPHY (ERCP) WITH PROPOFOL (N/A ) SPHINCTEROTOMY REMOVAL OF STONES BILIARY STENT PLACEMENT (N/A ) BILIARY DILATION  Patient Location: PACU  Anesthesia Type:General  Level of Consciousness: awake, alert  and oriented  Airway & Oxygen Therapy: Patient Spontanous Breathing and Patient connected to nasal cannula oxygen  Post-op Assessment: Report given to RN and Post -op Vital signs reviewed and stable  Post vital signs: Reviewed and stable  Last Vitals:  Vitals Value Taken Time  BP 160/97 10/30/19 0858  Temp    Pulse 56 10/30/19 0859  Resp 12 10/30/19 0859  SpO2 100 % 10/30/19 0859  Vitals shown include unvalidated device data.  Last Pain:  Vitals:   10/30/19 0650  TempSrc: Oral  PainSc: 0-No pain         Complications: No complications documented.

## 2019-10-30 NOTE — Op Note (Signed)
[facility] Patient Name: Tanner Thomas Procedure Date: 10/30/2019 MRN: [id number] Attending MD: Milus Banister , MD Date of Birth: March 01, 1956 CSN: [id number] Age: 64 Admit Type: Outpatient Procedure:                ERCP Indications:              Chronic calcific pancreatitis, etoh related. Recent                            dilated CBD, referred by Dr. Gala Romney and evauated by                            [facility] 2 months ago suggested distal CBD debris,                            sludge; interval lap chole Dr. Arnoldo Morale for                            cholelithiasis, IOC not performed. Normal LFTs Providers:                Milus Banister, MD, Benetta Spar RN, RN, Corie Chiquito, Technician, [facility], CRNA Referring MD:             Milton Ferguson, MD Medicines:                General Anesthesia, Cipro 400 mg IV, Indomethacin                            326 mg PR Complications:            No immediate complications. Estimated blood loss:                            None Estimated Blood Loss:     Estimated blood loss: none. Procedure:                Pre-Anesthesia Assessment:                           - Prior to the procedure, a History and Physical                            was performed, and patient medications and                            allergies were reviewed. The patient's tolerance of                            previous anesthesia was also reviewed. The risks                            and benefits of the procedure and the sedation  options and risks were discussed with the patient.                            All questions were answered, and informed consent                            was obtained. Prior Anticoagulants: The patient has                            taken no previous anticoagulant or antiplatelet                            agents. ASA Grade Assessment: III - A patient with                             severe systemic disease. After reviewing the risks                            and benefits, the patient was deemed in                            satisfactory condition to undergo the procedure.                           After obtaining informed consent, the scope was                            passed under direct vision. Throughout the                            procedure, the patient's blood pressure, pulse, and                            oxygen saturations were monitored continuously. The                            TJF-Q180V ([id number] Olympus Duodenoscope was                            introduced through the mouth, and used to inject                            contrast into and used for direct visualization of                            the bile duct. The ERCP was accomplished without                            difficulty. The patient tolerated the procedure                            well. Scope In: Scope Out: Findings:      A scout film of the abdomen was obtained. There were surgical clips  in       the RUQ from recent lap chole. The duodenoscope was advanced to the       region of the major papilla without detailed examination of the UGI       tract. The major papilla was bulbous. I used a 67 Autotome over a .035       hydrawire to cannulate the bile duct and injecte contrast. Cholangiogram       revealed a diffusely dilated CBD (34mm diameter) that tapered abruptly       but smoothly in the region of the major papilla. There was mobile,       amorophous filling defect within the CBD measuring 1.5cm consistent with       stone/stone debris. Intrahepatic ducts did not opacify. I performed a       biliary sphincterotomy over the wire and then used 12-15 and 15-97mm       biliary retrieval balloons to partially remove the filling defect. A       copious amount of green, brown stone debris was pulled into the duodenum       as well as some small black stone flecks. I was not certain that  I had       removed all of the stone/stone debris and elected to dilate the       sphincterotomy site using a 50mm [employer] biliary dilating       balloon. I then used the 12-20mm retrieval balloon several more times to       pull more flecks of stone debris from the bile duct into the duodenum. I       was not certain all the stone/stone debris was removed even on       completion cholangiogram (I felt the abrupt tapering of the distal bile       duct may mask further stone) and so elected to place a 10Fr 5cm plastic       biliary stent across the major papilla to be safe. The distal most       7-61mm of the plastic stent extended across the major papilla in good       position. The main pancreatic duct was never cannulated with the wire or       injected with dye. Impression:               - Choledocholithiasis was found within dilated CBD.                            At least partial removal was accomplished by                            biliary sphincterotomy, balloon dilation, balloon                            sweeping. This was followed by biliary stent                            placement given some concern I have for residual                            stone/stone debris. Moderate Sedation:      Not Applicable - Patient had care per Anesthesia. Recommendation:           -  Discharge patient to home.                           - Repeat ERCP in 2 months to remove the stent and                            any further stone/stone debris if needed. Procedure Code(s):        --- Professional ---                           [phone], Endoscopic retrograde                            cholangiopancreatography (ERCP); with removal of                            calculi/debris from biliary/pancreatic duct(s)                           43262, Endoscopic retrograde                            cholangiopancreatography (ERCP); with                            sphincterotomy/papillotomy Diagnosis  Code(s):        --- Professional ---                           K80.50, Calculus of bile duct without cholangitis                            or cholecystitis without obstruction CPT copyright 2019 American Medical Association. All rights reserved. The codes documented in this report are preliminary and upon coder review may  be revised to meet current compliance requirements. Milus Banister, MD 10/30/2019 9:15:07 AM This report has been signed electronically. Number of Addenda: 0

## 2019-10-30 NOTE — Consult Note (Signed)
HPI: This is a 64 year old man with chronic alcoholic pancreatitis, abnormal bile duct.  Endoscopic ultrasound 2 to 3 months ago suggested distal bile duct filled with debris.  Since then he has undergone laparoscopic cholecystectomy for gallstones in the gallbladder.  IOC was not performed.    ROS: complete GI ROS as described in HPI, all other review negative.  Constitutional:  No unintentional weight loss   Past Medical History:  Diagnosis Date  . Known health problems: none     Past Surgical History:  Procedure Laterality Date  . BIOPSY  03/06/2019   Procedure: BIOPSY;  Surgeon: Daneil Dolin, MD;  [facility];  Service: Endoscopy;;  gastric  . CHOLECYSTECTOMY N/A 09/05/2019   Procedure: LAPAROSCOPIC CHOLECYSTECTOMY;  Surgeon: Aviva Signs, MD;  Location: [facility];  Service: General;  Laterality: N/A;  . COLONOSCOPY WITH PROPOFOL N/A 03/06/2019   Procedure: COLONOSCOPY WITH PROPOFOL;  Surgeon: Daneil Dolin, MD;  [facility];  Service: Endoscopy;  Laterality: N/A;  9:45am - ok at 10:00 per [facility]  . ESOPHAGOGASTRODUODENOSCOPY (EGD) WITH PROPOFOL N/A 03/06/2019   Procedure: ESOPHAGOGASTRODUODENOSCOPY (EGD) WITH PROPOFOL;  Surgeon: Daneil Dolin, MD;  [facility];  Service: Endoscopy;  Laterality: N/A;  . ESOPHAGOGASTRODUODENOSCOPY (EGD) WITH PROPOFOL N/A 08/21/2019   Procedure: ESOPHAGOGASTRODUODENOSCOPY (EGD) WITH PROPOFOL;  Surgeon: Milus Banister, MD;  Location: [facility];  Service: Endoscopy;  Laterality: N/A;  . EUS N/A 08/21/2019   Procedure: UPPER ENDOSCOPIC ULTRASOUND (EUS) RADIAL;  Surgeon: Milus Banister, MD;  Location: [facility];  Service: Endoscopy;  Laterality: N/A;  . PANCREAS SURGERY      Current Facility-Administered Medications  Medication Dose Route Frequency Provider Last Rate Last Admin  . 0.9 %  sodium chloride infusion   Intravenous Continuous Milus Banister, MD        Allergies as of 10/06/2019  . (No  Known Allergies)    Family History  Problem Relation Age of Onset  . Heart disease Father   . Colon cancer Neg Hx   . Gastric cancer Neg Hx   . Esophageal cancer Neg Hx     Social History   Socioeconomic History  . Marital status: Single    Spouse name: Not on file  . Number of children: Not on file  . Years of education: Not on file  . Highest education level: Not on file  Occupational History  . Not on file  Tobacco Use  . Smoking status: Current Every Day Smoker    Packs/day: 0.25    Years: 50.00    Pack years: 12.50    Types: Cigarettes    Start date: 05/24/1967  . Smokeless tobacco: Never Used  Vaping Use  . Vaping Use: Never used  Substance and Sexual Activity  . Alcohol use: Not Currently    Alcohol/week: 13.0 standard drinks    Types: 13 Cans of beer per week    Comment: 03/04/19 states 3-4 a week; previously: as of 02-06-19 drinks about 12-13 beers weekly  . Drug use: Not Currently    Types: Marijuana    Comment: pt denies but + UDS 10/20  . Sexual activity: Yes  Other Topics Concern  . Not on file  Social History Narrative  . Not on file   Social Determinants of Health   Financial Resource Strain:   . Difficulty of Paying Living Expenses:   Food Insecurity:   . Worried About Charity fundraiser in the Last Year:   .  Ran Out of Food in the Last Year:   Transportation Needs:   . Film/video editor (Medical):   Marland Kitchen Lack of Transportation (Non-Medical):   Physical Activity:   . Days of Exercise per Week:   . Minutes of Exercise per Session:   Stress:   . Feeling of Stress :   Social Connections:   . Frequency of Communication with Friends and Family:   . Frequency of Social Gatherings with Friends and Family:   . Attends Religious Services:   . Active Member of Clubs or Organizations:   . Attends Archivist Meetings:   Marland Kitchen Marital Status:   Intimate Partner Violence:   . Fear of Current or Ex-Partner:   . Emotionally Abused:   Marland Kitchen  Physically Abused:   . Sexually Abused:      Physical Exam: BP (!) 149/82   Pulse (!) 51   Temp 98 F (36.7 C) (Oral)   Resp 15   Ht 5\' 10"  (1.778 m)   Wt 55.8 kg   SpO2 100%   BMI 17.65 kg/m  Constitutional: generally well-appearing Psychiatric: alert and oriented x3 Abdomen: soft, nontender, nondistended, no obvious ascites, no peritoneal signs, normal bowel sounds No peripheral edema noted in lower extremities  Assessment and plan: 64 y.o. male with abnormal bile duct, debris and bile duct on ultrasound 3 months ago  ERCP today.  Please see the "Patient Instructions" section for addition details about the plan.  Owens Loffler, MD [facility] 10/30/2019, 7:12 AM

## 2019-10-30 NOTE — H&P (Signed)
HPI:  This is a 64 year old man with chronic alcoholic pancreatitis, abnormal bile duct. Endoscopic ultrasound 2 to 3 months ago suggested distal bile duct filled with debris. Since then he has undergone laparoscopic cholecystectomy for gallstones in the gallbladder. IOC was not performed.  ROS: complete GI ROS as described in HPI, all other review negative.  Constitutional: No unintentional weight loss      Past Medical History:  Diagnosis Date  . Known health problems: none         Past Surgical History:  Procedure Laterality Date  . BIOPSY  03/06/2019   Procedure: BIOPSY; Surgeon: Daneil Dolin, MD; [facility]; Service: Endoscopy;; gastric  . CHOLECYSTECTOMY N/A 09/05/2019   Procedure: LAPAROSCOPIC CHOLECYSTECTOMY; Surgeon: Aviva Signs, MD; Location: [facility]; Service: General; Laterality: N/A;  . COLONOSCOPY WITH PROPOFOL N/A 03/06/2019   Procedure: COLONOSCOPY WITH PROPOFOL; Surgeon: Daneil Dolin, MD; [facility]; Service: Endoscopy; Laterality: N/A; 9:45am - ok at 10:00 per [facility]  . ESOPHAGOGASTRODUODENOSCOPY (EGD) WITH PROPOFOL N/A 03/06/2019   Procedure: ESOPHAGOGASTRODUODENOSCOPY (EGD) WITH PROPOFOL; Surgeon: Daneil Dolin, MD; [facility]; Service: Endoscopy; Laterality: N/A;  . ESOPHAGOGASTRODUODENOSCOPY (EGD) WITH PROPOFOL N/A 08/21/2019   Procedure: ESOPHAGOGASTRODUODENOSCOPY (EGD) WITH PROPOFOL; Surgeon: Milus Banister, MD; [facility]; Service: Endoscopy; Laterality: N/A;  . EUS N/A 08/21/2019   Procedure: UPPER ENDOSCOPIC ULTRASOUND (EUS) RADIAL; Surgeon: Milus Banister, MD; [facility]; Service: Endoscopy; Laterality: N/A;  . PANCREAS SURGERY              Current Facility-Administered Medications  Medication Dose Route Frequency Provider Last Rate Last Admin  . 0.9 % sodium chloride infusion  Intravenous Continuous Milus Banister, MD        Allergies as of 10/06/2019  . (No Known Allergies)         Family History  Problem Relation Age of Onset  . Heart disease Father   . Colon cancer Neg Hx   . Gastric cancer Neg Hx   . Esophageal cancer Neg Hx    Social History        Socioeconomic History  . Marital status: Single    Spouse name: Not on file  . Number of children: Not on file  . Years of education: Not on file  . Highest education level: Not on file  Occupational History  . Not on file  Tobacco Use  . Smoking status: Current Every Day Smoker    Packs/day: 0.25    Years: 50.00    Pack years: 12.50    Types: Cigarettes    Start date: 05/24/1967  . Smokeless tobacco: Never Used  Vaping Use  . Vaping Use: Never used  Substance and Sexual Activity  . Alcohol use: Not Currently    Alcohol/week: 13.0 standard drinks    Types: 13 Cans of beer per week    Comment: 03/04/19 states 3-4 a week; previously: as of 02-06-19 drinks about 12-13 beers weekly  . Drug use: Not Currently    Types: Marijuana    Comment: pt denies but + UDS 10/20  . Sexual activity: Yes  Other Topics Concern  . Not on file  Social History Narrative  . Not on file   Social Determinants of Health      Financial Resource Strain:   . Difficulty of Paying Living Expenses:   Food Insecurity:   . Worried About Charity fundraiser in the Last Year:   . [employer]  in the Last Year:   Transportation Needs:   . Film/video editor (Medical):   Marland Kitchen Lack of Transportation (Non-Medical):   Physical Activity:   . Days of Exercise per Week:   . Minutes of Exercise per Session:   Stress:   . Feeling of Stress :   Social Connections:   . Frequency of Communication with Friends and Family:   . Frequency of Social Gatherings with Friends and Family:   . Attends Religious Services:   . Active Member of Clubs or Organizations:   . Attends Archivist Meetings:   Marland Kitchen Marital Status:   Intimate Partner Violence:   . Fear of Current or Ex-Partner:   . Emotionally Abused:   Marland Kitchen Physically  Abused:   . Sexually Abused:    Physical Exam:  BP (!) 149/82  Pulse (!) 51  Temp 98 F (36.7 C) (Oral)  Resp 15  Ht 5\' 10"  (1.778 m)  Wt 55.8 kg  SpO2 100%  BMI 17.65 kg/m  Constitutional: generally well-appearing  Psychiatric: alert and oriented x3  Abdomen: soft, nontender, nondistended, no obvious ascites, no peritoneal signs, normal bowel sounds  No peripheral edema noted in lower extremities  Assessment and plan:  64 y.o. male with abnormal bile duct, debris and bile duct on ultrasound 3 months ago  ERCP today.  Please see the "Patient Instructions" section for addition details about the plan.  Owens Loffler, MD  [facility]  10/30/2019, 7:12 AM

## 2019-10-30 NOTE — Discharge Instructions (Signed)
YOU HAD AN ENDOSCOPIC PROCEDURE TODAY: Refer to the procedure report and other information in the discharge instructions given to you for any specific questions about what was found during the examination. If this information does not answer your questions, please call [place] office at [phone] to clarify.  ° °YOU SHOULD EXPECT: Some feelings of bloating in the abdomen. Passage of more gas than usual. Walking can help get rid of the air that was put into your GI tract during the procedure and reduce the bloating. If you had a lower endoscopy (such as a colonoscopy or flexible sigmoidoscopy) you may notice spotting of blood in your stool or on the toilet paper. Some abdominal soreness may be present for a day or two, also. ° °DIET: Your first meal following the procedure should be a light meal and then it is ok to progress to your normal diet. A half-sandwich or bowl of soup is an example of a good first meal. Heavy or fried foods are harder to digest and may make you feel nauseous or bloated. Drink plenty of fluids but you should avoid alcoholic beverages for 24 hours. If you had a esophageal dilation, please see attached instructions for diet.   ° °ACTIVITY: Your care partner should take you home directly after the procedure. You should plan to take it easy, moving slowly for the rest of the day. You can resume normal activity the day after the procedure however YOU SHOULD NOT DRIVE, use power tools, machinery or perform tasks that involve climbing or major physical exertion for 24 hours (because of the sedation medicines used during the test).  ° °SYMPTOMS TO REPORT IMMEDIATELY: °A gastroenterologist can be reached at any hour. Please call [phone]  for any of the following symptoms:  °Following lower endoscopy (colonoscopy, flexible sigmoidoscopy) °Excessive amounts of blood in the stool  °Significant tenderness, worsening of abdominal pains  °Swelling of the abdomen that is new, acute  °Fever of 100° or  higher  °Following upper endoscopy (EGD, EUS, ERCP, esophageal dilation) °Vomiting of blood or coffee ground material  °New, significant abdominal pain  °New, significant chest pain or pain under the shoulder blades  °Painful or persistently difficult swallowing  °New shortness of breath  °Black, tarry-looking or red, bloody stools ° °FOLLOW UP:  °If any biopsies were taken you will be contacted by phone or by letter within the next 1-3 weeks. Call [phone]  if you have not heard about the biopsies in 3 weeks.  °Please also call with any specific questions about appointments or follow up tests. ° °

## 2019-10-30 NOTE — Anesthesia Procedure Notes (Addendum)
Procedure Name: Intubation Date/Time: 10/30/2019 7:39 AM Performed by: Arrick Dutton D, CRNA Pre-anesthesia Checklist: Patient identified, Emergency Drugs available, Suction available and Patient being monitored Patient Re-evaluated:Patient Re-evaluated prior to induction Oxygen Delivery Method: Circle system utilized Preoxygenation: Pre-oxygenation with 100% oxygen Induction Type: IV induction Ventilation: Mask ventilation without difficulty Laryngoscope Size: Mac and 4 (intubation by Ebony Hail SRNA) Grade View: Grade II Tube type: Oral Tube size: 7.5 mm Number of attempts: 1 Airway Equipment and Method: Stylet Placement Confirmation: ETT inserted through vocal cords under direct vision,  positive ETCO2 and breath sounds checked- equal and bilateral Secured at: 22 cm Tube secured with: Tape Dental Injury: Teeth and Oropharynx as per pre-operative assessment

## 2019-10-30 NOTE — Anesthesia Preprocedure Evaluation (Signed)
Anesthesia Evaluation  Patient identified by MRN, date of birth, ID band Patient awake    Reviewed: Allergy & Precautions, NPO status , Patient's Chart, lab work & pertinent test results  Airway Mallampati: I  TM Distance: >3 FB Neck ROM: Full    Dental   Pulmonary Current Smoker,    Pulmonary exam normal        Cardiovascular Normal cardiovascular exam     Neuro/Psych    GI/Hepatic   Endo/Other    Renal/GU      Musculoskeletal   Abdominal   Peds  Hematology   Anesthesia Other Findings   Reproductive/Obstetrics                             Anesthesia Physical Anesthesia Plan  ASA: II  Anesthesia Plan: General   Post-op Pain Management:    Induction: Intravenous  PONV Risk Score and Plan: 1 and Ondansetron  Airway Management Planned: Oral ETT  Additional Equipment:   Intra-op Plan:   Post-operative Plan: Extubation in OR  Informed Consent: I have reviewed the patients History and Physical, chart, labs and discussed the procedure including the risks, benefits and alternatives for the proposed anesthesia with the patient or authorized representative who has indicated his/her understanding and acceptance.       Plan Discussed with: CRNA and Surgeon  Anesthesia Plan Comments:         Anesthesia Quick Evaluation

## 2019-10-30 NOTE — Anesthesia Postprocedure Evaluation (Signed)
Anesthesia Post Note  Patient: Tanner Thomas  Procedure(s) Performed: ENDOSCOPIC RETROGRADE CHOLANGIOPANCREATOGRAPHY (ERCP) WITH PROPOFOL (N/A ) SPHINCTEROTOMY REMOVAL OF STONES BILIARY STENT PLACEMENT (N/A ) BILIARY DILATION     Patient location during evaluation: PACU Anesthesia Type: General Level of consciousness: awake and alert Pain management: pain level controlled Vital Signs Assessment: post-procedure vital signs reviewed and stable Respiratory status: spontaneous breathing, nonlabored ventilation, respiratory function stable and patient connected to nasal cannula oxygen Cardiovascular status: blood pressure returned to baseline and stable Postop Assessment: no apparent nausea or vomiting Anesthetic complications: no   No complications documented.  Last Vitals:  Vitals:   10/30/19 0920 10/30/19 0930  BP: (!) 163/85 (!) 170/87  Pulse: (!) 56 (!) 46  Resp: 14 13  Temp:    SpO2: 100% 100%    Last Pain:  Vitals:   10/30/19 0930  TempSrc:   PainSc: 0-No pain                 Ceren[place] Curb Keefer

## 2019-10-31 ENCOUNTER — Encounter (HOSPITAL_COMMUNITY): Payer: Self-pay | Admitting: Gastroenterology

## 2019-12-03 ENCOUNTER — Ambulatory Visit: Payer: Self-pay | Admitting: Nurse Practitioner

## 2019-12-31 ENCOUNTER — Other Ambulatory Visit: Payer: Self-pay

## 2019-12-31 ENCOUNTER — Telehealth: Payer: Self-pay

## 2019-12-31 ENCOUNTER — Ambulatory Visit: Payer: Self-pay | Admitting: Physician Assistant

## 2019-12-31 DIAGNOSIS — K838 Other specified diseases of biliary tract: Secondary | ICD-10-CM

## 2019-12-31 DIAGNOSIS — K862 Cyst of pancreas: Secondary | ICD-10-CM

## 2019-12-31 DIAGNOSIS — Z4689 Encounter for fitting and adjustment of other specified devices: Secondary | ICD-10-CM

## 2019-12-31 NOTE — Telephone Encounter (Signed)
ERCP scheduled for 02/05/20 COVID test scheduled on 10/11.  Information mailed to the pt home.  Left message on machine to call back

## 2019-12-31 NOTE — Telephone Encounter (Signed)
-----   Message from Timothy Lasso, RN sent at 10/30/2019  9:25 AM EDT -----  ----- Message ----- From: Milus Banister, MD Sent: 10/30/2019   9:16 AM EDT To: Timothy Lasso, RN, Daneil Dolin, MD  Tanner Thomas, Just completed ERCP.  See below. Thanks  Tanner Thomas, he needs repeat ERCP at [facility] in 2 months for stent removal.  Thanks        Choledocholithiasis was found within dilated CBD. At least partial removal was accomplished by biliary sphincterotomy, balloon dilation, balloon sweeping. This was followed by biliary stent placement given some concern I have for residual stone/stone debris.

## 2020-01-01 NOTE — Telephone Encounter (Signed)
Left message on machine to call back  Unable to reach pt by phone several messages left and all instructions mailed to the home.  Will wait for the pt to return call and confirm appt.

## 2020-01-14 ENCOUNTER — Ambulatory Visit: Payer: Self-pay | Admitting: Physician Assistant

## 2020-01-15 ENCOUNTER — Encounter: Payer: Self-pay | Admitting: Internal Medicine

## 2020-01-15 ENCOUNTER — Ambulatory Visit: Payer: Self-pay | Admitting: Nurse Practitioner

## 2020-01-15 NOTE — Progress Notes (Deleted)
Referring Provider: Soyla Dryer, PA-C Primary Care Physician:  Patient, No Pcp Per Primary GI:  Dr. Gala Romney  No chief complaint on file.   HPI:   Tanner Thomas is a 64 y.o. male who presents for follow-up on weight loss and anemia and due for 1 year colonoscopy in November.  Patient was last seen in our office 06/05/2019 for anemia, weight loss, abnormal CT of the abdomen with cyst on the pancreas.  Noted history of chronic alcohol abuse and unintentional 25 pound weight loss over several months with good oral intake.  Previous pancreatic procedure/surgery but unsure of what it was and recently relocated from [place].  Previous abdominal imaging noted consistency with chronic pancreatitis but a new rim-enhancing fluid collection adjacent to the pancreatic tail measuring 2.2 x 1.2 cm consistent with a small pancreatic pseudocyst as well as diffuse biliary ductal dilation 13 mm in diameter to the level of the pancreatic head.  Previously noted recent drop in hemoglobin from 10.1-8.2 without elevated LFTs despite biliary ductal dilation and obstructed CBD.  Colonoscopy up-to-date 03/06/2019 with inadequate bowel prep, rectal varix, grossly normal-appearing colon and recommended repeat in 1 year with good prep, EUS to further evaluate pancreaticobiliary tree and recommended pancreatic enzymes.  EGD the same day found grade 1-2 esophageal varices, portal hypertensive gastropathy status post biopsy, normal duodenum.  Surgical pathology, biopsy to be mild chronic gastritis with focal noncaseating granuloma, negative for H. pylori, intestinal metaplasia, dysplasia, or malignancy.  Previously scheduled for EUS which was canceled because he was a no-show for Covid testing.  Previously recommended low-dose CT of the chest and EUS as well start pancreatic enzymes due to fat droplets noted in the stool during colonoscopy.  He was provided Creon samples and prior authorization was ongoing for  Creon.  At his last visit feels he is up 8 to 9 pounds on Creon, anemia improved.  No obvious bleeding.  Overall with no GI symptoms.  Recommended update labs, notify us of any bleeding, MRI, follow-up for reschedule EUS, continue Creon, follow-up in 6 months.  Labs not completed as requested but later in the month he was in the hospital and completed similar labs.  CBC dated 07/09/2019 found to be mostly normal with mild thrombocytopenia and platelet count of 131.  CMP completely normal.  MRI/MRCP completed 06/20/2019 which found prior pseudocyst along the pancreatic tail resolved.  Stable prominence of the pancreatic head/company process with atrophy of the pancreatic tail and segmental dilation of the main pancreatic duct.  Favored to reflect sequela of chronic calcific pancreatitis without obstructing mass evident.  However noted risk of missing pancreatic neoplasm and recommended follow-up with EUS.  He did have an ERCP on 10/30/2019 which found dilated extrahepatic biliary system with filling defects suggestive for stones with sludge and placement of nonmetallic biliary stent.  He does have a repeat ERCP scheduled for 02/05/2020 for stent removal.  Today he states doing okay overall.   Past Medical History:  Diagnosis Date  . Known health problems: none     Past Surgical History:  Procedure Laterality Date  . BILIARY DILATION  10/30/2019   Procedure: BILIARY DILATION;  Surgeon: Milus Banister, MD;  Location: [facility];  Service: Endoscopy;;  . BILIARY STENT PLACEMENT N/A 10/30/2019   Procedure: BILIARY STENT PLACEMENT;  Surgeon: Milus Banister, MD;  Location: [facility];  Service: Endoscopy;  Laterality: N/A;  . BIOPSY  03/06/2019   Procedure: BIOPSY;  Surgeon: Daneil Dolin, MD;  Location: [facility];  Service: Endoscopy;;  gastric  . CHOLECYSTECTOMY N/A 09/05/2019   Procedure: LAPAROSCOPIC CHOLECYSTECTOMY;  Surgeon: Aviva Signs, MD;  Location: [facility];  Service: General;   Laterality: N/A;  . COLONOSCOPY WITH PROPOFOL N/A 03/06/2019   Procedure: COLONOSCOPY WITH PROPOFOL;  Surgeon: Daneil Dolin, MD;  Location: [facility];  Service: Endoscopy;  Laterality: N/A;  9:45am - ok at 10:00 per [facility]  . ENDOSCOPIC RETROGRADE CHOLANGIOPANCREATOGRAPHY (ERCP) WITH PROPOFOL N/A 10/30/2019   Procedure: ENDOSCOPIC RETROGRADE CHOLANGIOPANCREATOGRAPHY (ERCP) WITH PROPOFOL;  Surgeon: Milus Banister, MD;  Location: [facility];  Service: Endoscopy;  Laterality: N/A;  . ESOPHAGOGASTRODUODENOSCOPY (EGD) WITH PROPOFOL N/A 03/06/2019   Procedure: ESOPHAGOGASTRODUODENOSCOPY (EGD) WITH PROPOFOL;  Surgeon: Daneil Dolin, MD;  Location: [facility];  Service: Endoscopy;  Laterality: N/A;  . ESOPHAGOGASTRODUODENOSCOPY (EGD) WITH PROPOFOL N/A 08/21/2019   Procedure: ESOPHAGOGASTRODUODENOSCOPY (EGD) WITH PROPOFOL;  Surgeon: Milus Banister, MD;  Location: [facility];  Service: Endoscopy;  Laterality: N/A;  . EUS N/A 08/21/2019   Procedure: UPPER ENDOSCOPIC ULTRASOUND (EUS) RADIAL;  Surgeon: Milus Banister, MD;  Location: [facility];  Service: Endoscopy;  Laterality: N/A;  . PANCREAS SURGERY    . REMOVAL OF STONES  10/30/2019   Procedure: REMOVAL OF STONES;  Surgeon: Milus Banister, MD;  Location: [facility];  Service: Endoscopy;;  . [name]  10/30/2019   Procedure: [name];  Surgeon: Milus Banister, MD;  Location: [facility];  Service: Endoscopy;;    Current Outpatient Medications  Medication Sig Dispense Refill  . acetaminophen (TYLENOL) 500 MG tablet Take 1,000 mg by mouth every 6 (six) hours as needed for moderate pain.    Marland Kitchen lipase/protease/amylase (CREON) 36000 UNITS CPEP capsule Take 36,000 Units by mouth 3 (three) times daily with meals.    Marland Kitchen oxyCODONE (ROXICODONE) 5 MG immediate release tablet Take 1 tablet (5 mg total) by mouth every 4 (four) hours as needed for severe pain. 30 tablet 0   No current facility-administered medications for this  visit.    Allergies as of 01/15/2020  . (No Known Allergies)    Family History  Problem Relation Age of Onset  . Heart disease Father   . Colon cancer Neg Hx   . Gastric cancer Neg Hx   . Esophageal cancer Neg Hx     Social History   Socioeconomic History  . Marital status: Single    Spouse name: Not on file  . Number of children: Not on file  . Years of education: Not on file  . Highest education level: Not on file  Occupational History  . Not on file  Tobacco Use  . Smoking status: Current Every Day Smoker    Packs/day: 0.25    Years: 50.00    Pack years: 12.50    Types: Cigarettes    Start date: 05/24/1967  . Smokeless tobacco: Never Used  Vaping Use  . Vaping Use: Never used  Substance and Sexual Activity  . Alcohol use: Not Currently    Alcohol/week: 13.0 standard drinks    Types: 13 Cans of beer per week    Comment: 03/04/19 states 3-4 a week; previously: as of 02-06-19 drinks about 12-13 beers weekly  . Drug use: Not Currently    Types: Marijuana    Comment: pt denies but + UDS 10/20  . Sexual activity: Yes  Other Topics Concern  . Not on file  Social History Narrative  . Not on file   Social Determinants of  Health   Financial Resource Strain:   . Difficulty of Paying Living Expenses: Not on file  Food Insecurity:   . Worried About Charity fundraiser in the Last Year: Not on file  . Ran Out of Food in the Last Year: Not on file  Transportation Needs:   . Lack of Transportation (Medical): Not on file  . Lack of Transportation (Non-Medical): Not on file  Physical Activity:   . Days of Exercise per Week: Not on file  . Minutes of Exercise per Session: Not on file  Stress:   . Feeling of Stress : Not on file  Social Connections:   . Frequency of Communication with Friends and Family: Not on file  . Frequency of Social Gatherings with Friends and Family: Not on file  . Attends Religious Services: Not on file  . Active Member of Clubs or  Organizations: Not on file  . Attends Archivist Meetings: Not on file  . Marital Status: Not on file    Subjective: Review of Systems  Constitutional: Negative for chills, fever, malaise/fatigue and weight loss.  HENT: Negative for congestion and sore throat.   Respiratory: Negative for cough and shortness of breath.   Cardiovascular: Negative for chest pain and palpitations.  Gastrointestinal: Negative for abdominal pain, blood in stool, diarrhea, melena, nausea and vomiting.  Musculoskeletal: Negative for joint pain and myalgias.  Skin: Negative for rash.  Neurological: Negative for dizziness and weakness.  Endo/Heme/Allergies: Does not bruise/bleed easily.  Psychiatric/Behavioral: Negative for depression. The patient is not nervous/anxious.   All other systems reviewed and are negative.    Objective: There were no vitals taken for this visit. Physical Exam Vitals and nursing note reviewed.  Constitutional:      General: He is not in acute distress.    Appearance: Normal appearance. He is not ill-appearing, toxic-appearing or diaphoretic.  HENT:     Head: Normocephalic and atraumatic.     Nose: No congestion or rhinorrhea.  Eyes:     General: No scleral icterus. Cardiovascular:     Rate and Rhythm: Normal rate and regular rhythm.     Heart sounds: Normal heart sounds.  Pulmonary:     Effort: Pulmonary effort is normal.     Breath sounds: Normal breath sounds.  Abdominal:     General: Bowel sounds are normal. There is no distension.     Palpations: Abdomen is soft. There is no hepatomegaly, splenomegaly or mass.     Tenderness: There is no abdominal tenderness. There is no guarding or rebound.     Hernia: No hernia is present.  Musculoskeletal:     Cervical back: Neck supple.  Skin:    General: Skin is warm and dry.     Coloration: Skin is not jaundiced.     Findings: No bruising or rash.  Neurological:     General: No focal deficit present.     Mental  Status: He is alert and oriented to person, place, and time. Mental status is at baseline.  Psychiatric:        Mood and Affect: Mood normal.        Behavior: Behavior normal.        Thought Content: Thought content normal.      Assessment:  ***   Plan: ***    Thank you for allowing [place] to participate in the care of Carlyle Basques, DNP, AGNP-C Adult & Gerontological Nurse Practitioner [facility]  01/15/2020 7:18 AM   Disclaimer: This note was dictated with voice recognition software. Similar sounding words can inadvertently be transcribed and may not be corrected upon review.

## 2020-01-20 ENCOUNTER — Other Ambulatory Visit: Payer: Self-pay

## 2020-01-20 ENCOUNTER — Encounter: Payer: Self-pay | Admitting: Physician Assistant

## 2020-01-20 ENCOUNTER — Ambulatory Visit: Payer: Self-pay | Admitting: Physician Assistant

## 2020-01-20 VITALS — BP 114/74 | HR 70 | Temp 97.6°F | Ht 70.0 in | Wt 142.0 lb

## 2020-01-20 DIAGNOSIS — M1711 Unilateral primary osteoarthritis, right knee: Secondary | ICD-10-CM

## 2020-01-20 DIAGNOSIS — R9389 Abnormal findings on diagnostic imaging of other specified body structures: Secondary | ICD-10-CM

## 2020-01-20 DIAGNOSIS — F17200 Nicotine dependence, unspecified, uncomplicated: Secondary | ICD-10-CM

## 2020-01-20 DIAGNOSIS — IMO0001 Reserved for inherently not codable concepts without codable children: Secondary | ICD-10-CM

## 2020-01-20 DIAGNOSIS — R221 Localized swelling, mass and lump, neck: Secondary | ICD-10-CM

## 2020-01-20 NOTE — Progress Notes (Signed)
BP 114/74   Pulse 70   Temp 97.6 F (36.4 C)   Ht 5\' 10"  (1.778 m)   Wt 142 lb (64.4 kg)   SpO2 91%   BMI 20.37 kg/m    Subjective:    Patient ID: Tanner Thomas, male    DOB: [birth year], 64 y.o.   MRN: [id number]  HPI: Tanner Thomas is a 64 y.o. male presenting on 01/20/2020 for No chief complaint on file.   HPI   Pt had a negative covid 19 screening questionnaire.    Pt is 54yoM who presents for routine follow up.  Lung nodules on CT 04/30/19- due to repeat CT January 2022  Neck mass on CT - 07/09/19- he was supposed to f/u with ENT but didn't.  Cholecystectomy done May 2021  Pt scheduled for ERCP on 02/05/20  He says he is feeling pretty well now.  He says he has gained some weight and attributes this to cutting back etoh and smoking.  He says he usually is drinking 2 beer/week now.    He says he is now smoking 2 cigarettes/week.    He says he is ready to get to orthopedics for his knee.  He was referred back in March but didn't schedule due to he wasn't approved for financial assistance at that time.  He cares for his cousin who is in his 49s.        Relevant past medical, surgical, family and social history reviewed and updated as indicated. Interim medical history since our last visit reviewed. Allergies and medications reviewed and updated.     CURRENT MEDS: APAP prn    Review of Systems  Per HPI unless specifically indicated above     Objective:    BP 114/74   Pulse 70   Temp 97.6 F (36.4 C)   Ht 5\' 10"  (1.778 m)   Wt 142 lb (64.4 kg)   SpO2 91%   BMI 20.37 kg/m   Wt Readings from Last 3 Encounters:  01/20/20 142 lb (64.4 kg)  10/30/19 123 lb (55.8 kg)  09/03/19 127 lb (57.6 kg)    Physical Exam Vitals reviewed.  Constitutional:      General: He is not in acute distress.    Appearance: He is well-developed.  HENT:     Head: Normocephalic and atraumatic.  Neck:     Comments: Hard mass L submental region which is not  tender. Cardiovascular:     Rate and Rhythm: Normal rate and regular rhythm.  Pulmonary:     Effort: Pulmonary effort is normal.     Breath sounds: Normal breath sounds. No wheezing.  Abdominal:     General: Bowel sounds are normal.     Palpations: Abdomen is soft.     Tenderness: There is no abdominal tenderness.  Musculoskeletal:     Cervical back: Neck supple.     Right knee: Deformity present.     Right lower leg: No edema.     Left lower leg: No edema.  Skin:    General: Skin is warm and dry.  Neurological:     Mental Status: He is alert and oriented to person, place, and time.  Psychiatric:        Behavior: Behavior normal.          Assessment & Plan:   Encounter Diagnoses  Name Primary?  . Osteoarthritis of right knee, unspecified osteoarthritis type Yes  . Submental mass   . Abnormal CT scan, neck   .  Lung nodule < 6cm on CT   . Tobacco use disorder      -Pt approved for 100% cafa/[employer] financial assisstance -CT chest - January 2022 -referral to Orthopedics renewed for R knee   -referral to ENT for neck mass with abnormal CT neck.  Discussed with pt that no ENT affiliated with [facility].  Discussed he may need to apply for financial assistance through [facility] -pt is given a few more Ensure and is encouraged to continue eating nutritiously -pt to follow up here January so his CT can be arranged.   His PSA will need to be updated at that time as well.   He is to contact office sooner prn

## 2020-01-20 NOTE — H&P (View-Only) (Signed)
BP 114/74   Pulse 70   Temp 97.6 F (36.4 C)   Ht 5\' 10"  (1.778 m)   Wt 142 lb (64.4 kg)   SpO2 91%   BMI 20.37 kg/m    Subjective:    Patient ID: Tanner Thomas, male    DOB: [birth year], 64 y.o.   MRN: [id number]  HPI: Tanner Thomas is a 64 y.o. male presenting on 01/20/2020 for No chief complaint on file.   HPI   Pt had a negative covid 19 screening questionnaire.    Pt is 85yoM who presents for routine follow up.  Lung nodules on CT 04/30/19- due to repeat CT January 2022  Neck mass on CT - 07/09/19- he was supposed to f/u with ENT but didn't.  Cholecystectomy done May 2021  Pt scheduled for ERCP on 02/05/20  He says he is feeling pretty well now.  He says he has gained some weight and attributes this to cutting back etoh and smoking.  He says he usually is drinking 2 beer/week now.    He says he is now smoking 2 cigarettes/week.    He says he is ready to get to orthopedics for his knee.  He was referred back in March but didn't schedule due to he wasn't approved for financial assistance at that time.  He cares for his cousin who is in his 43s.        Relevant past medical, surgical, family and social history reviewed and updated as indicated. Interim medical history since our last visit reviewed. Allergies and medications reviewed and updated.     CURRENT MEDS: APAP prn    Review of Systems  Per HPI unless specifically indicated above     Objective:    BP 114/74   Pulse 70   Temp 97.6 F (36.4 C)   Ht 5\' 10"  (1.778 m)   Wt 142 lb (64.4 kg)   SpO2 91%   BMI 20.37 kg/m   Wt Readings from Last 3 Encounters:  01/20/20 142 lb (64.4 kg)  10/30/19 123 lb (55.8 kg)  09/03/19 127 lb (57.6 kg)    Physical Exam Vitals reviewed.  Constitutional:      General: He is not in acute distress.    Appearance: He is well-developed.  HENT:     Head: Normocephalic and atraumatic.  Neck:     Comments: Hard mass L submental region which is not  tender. Cardiovascular:     Rate and Rhythm: Normal rate and regular rhythm.  Pulmonary:     Effort: Pulmonary effort is normal.     Breath sounds: Normal breath sounds. No wheezing.  Abdominal:     General: Bowel sounds are normal.     Palpations: Abdomen is soft.     Tenderness: There is no abdominal tenderness.  Musculoskeletal:     Cervical back: Neck supple.     Right knee: Deformity present.     Right lower leg: No edema.     Left lower leg: No edema.  Skin:    General: Skin is warm and dry.  Neurological:     Mental Status: He is alert and oriented to person, place, and time.  Psychiatric:        Behavior: Behavior normal.          Assessment & Plan:   Encounter Diagnoses  Name Primary?  . Osteoarthritis of right knee, unspecified osteoarthritis type Yes  . Submental mass   . Abnormal CT scan, neck   .  Lung nodule < 6cm on CT   . Tobacco use disorder      -Pt approved for 100% cafa/[employer] financial assisstance -CT chest - January 2022 -referral to Orthopedics renewed for R knee   -referral to ENT for neck mass with abnormal CT neck.  Discussed with pt that no ENT affiliated with [facility].  Discussed he may need to apply for financial assistance through [facility] -pt is given a few more Ensure and is encouraged to continue eating nutritiously -pt to follow up here January so his CT can be arranged.   His PSA will need to be updated at that time as well.   He is to contact office sooner prn

## 2020-01-22 ENCOUNTER — Encounter (HOSPITAL_COMMUNITY): Payer: Self-pay | Admitting: Gastroenterology

## 2020-02-02 ENCOUNTER — Other Ambulatory Visit (HOSPITAL_COMMUNITY)
Admission: RE | Admit: 2020-02-02 | Discharge: 2020-02-02 | Disposition: A | Discharge status: Home / Self Care | Payer: HRSA Program | Source: Ambulatory Visit | Attending: Gastroenterology | Admitting: Gastroenterology

## 2020-02-02 DIAGNOSIS — Z01812 Encounter for preprocedural laboratory examination: Secondary | ICD-10-CM | POA: Insufficient documentation

## 2020-02-02 DIAGNOSIS — Z20822 Contact with and (suspected) exposure to covid-19: Secondary | ICD-10-CM | POA: Insufficient documentation

## 2020-02-02 LAB — SARS CORONAVIRUS 2 (TAT 6-24 HRS): SARS Coronavirus 2: NEGATIVE

## 2020-02-05 ENCOUNTER — Encounter (HOSPITAL_COMMUNITY): Payer: Self-pay | Admitting: Gastroenterology

## 2020-02-05 ENCOUNTER — Other Ambulatory Visit: Payer: Self-pay

## 2020-02-05 ENCOUNTER — Ambulatory Visit (HOSPITAL_COMMUNITY): Payer: Self-pay | Admitting: Anesthesiology

## 2020-02-05 ENCOUNTER — Encounter (HOSPITAL_COMMUNITY)
Admission: RE | Disposition: A | Discharge status: Home / Self Care | Payer: Self-pay | Source: Home / Self Care | Attending: Gastroenterology

## 2020-02-05 ENCOUNTER — Ambulatory Visit (HOSPITAL_COMMUNITY): Payer: Self-pay

## 2020-02-05 ENCOUNTER — Observation Stay (HOSPITAL_COMMUNITY)
Admission: RE | Admit: 2020-02-05 | Discharge: 2020-02-06 | Disposition: A | Discharge status: Home / Self Care | Payer: Self-pay | Attending: Gastroenterology | Admitting: Gastroenterology

## 2020-02-05 DIAGNOSIS — K831 Obstruction of bile duct: Secondary | ICD-10-CM

## 2020-02-05 DIAGNOSIS — Z23 Encounter for immunization: Secondary | ICD-10-CM | POA: Insufficient documentation

## 2020-02-05 DIAGNOSIS — K862 Cyst of pancreas: Secondary | ICD-10-CM

## 2020-02-05 DIAGNOSIS — K8050 Calculus of bile duct without cholangitis or cholecystitis without obstruction: Principal | ICD-10-CM

## 2020-02-05 DIAGNOSIS — Z4689 Encounter for fitting and adjustment of other specified devices: Secondary | ICD-10-CM

## 2020-02-05 DIAGNOSIS — Z4659 Encounter for fitting and adjustment of other gastrointestinal appliance and device: Secondary | ICD-10-CM

## 2020-02-05 DIAGNOSIS — R001 Bradycardia, unspecified: Secondary | ICD-10-CM

## 2020-02-05 DIAGNOSIS — F17210 Nicotine dependence, cigarettes, uncomplicated: Secondary | ICD-10-CM | POA: Insufficient documentation

## 2020-02-05 DIAGNOSIS — K922 Gastrointestinal hemorrhage, unspecified: Secondary | ICD-10-CM | POA: Diagnosis present

## 2020-02-05 DIAGNOSIS — K838 Other specified diseases of biliary tract: Secondary | ICD-10-CM

## 2020-02-05 DIAGNOSIS — K859 Acute pancreatitis without necrosis or infection, unspecified: Secondary | ICD-10-CM

## 2020-02-05 HISTORY — PX: STENT REMOVAL: SHX6421

## 2020-02-05 HISTORY — PX: BILIARY DILATION: SHX6850

## 2020-02-05 HISTORY — DX: Gastrointestinal hemorrhage, unspecified: K92.2

## 2020-02-05 HISTORY — PX: BIOPSY: SHX5522

## 2020-02-05 HISTORY — PX: SPHINCTEROTOMY: SHX5279

## 2020-02-05 HISTORY — PX: BILIARY STENT PLACEMENT: SHX5538

## 2020-02-05 HISTORY — PX: SPYGLASS CHOLANGIOSCOPY: SHX5441

## 2020-02-05 HISTORY — PX: REMOVAL OF STONES: SHX5545

## 2020-02-05 HISTORY — PX: ENDOSCOPIC RETROGRADE CHOLANGIOPANCREATOGRAPHY (ERCP) WITH PROPOFOL: SHX5810

## 2020-02-05 LAB — CBC WITH DIFFERENTIAL/PLATELET
Abs Immature Granulocytes: 0.02 10*3/uL (ref 0.00–0.07)
Basophils Absolute: 0 10*3/uL (ref 0.0–0.1)
Basophils Relative: 0 %
Eosinophils Absolute: 0 10*3/uL (ref 0.0–0.5)
Eosinophils Relative: 0 %
HCT: 41.8 % (ref 39.0–52.0)
Hemoglobin: 13.6 g/dL (ref 13.0–17.0)
Immature Granulocytes: 0 %
Lymphocytes Relative: 8 %
Lymphs Abs: 0.6 10*3/uL — ABNORMAL LOW (ref 0.7–4.0)
MCH: 30.7 pg (ref 26.0–34.0)
MCHC: 32.5 g/dL (ref 30.0–36.0)
MCV: 94.4 fL (ref 80.0–100.0)
Monocytes Absolute: 0.1 10*3/uL (ref 0.1–1.0)
Monocytes Relative: 1 %
Neutro Abs: 7.3 10*3/uL (ref 1.7–7.7)
Neutrophils Relative %: 91 %
Platelets: 112 10*3/uL — ABNORMAL LOW (ref 150–400)
RBC: 4.43 MIL/uL (ref 4.22–5.81)
RDW: 13.6 % (ref 11.5–15.5)
WBC: 8 10*3/uL (ref 4.0–10.5)
nRBC: 0 % (ref 0.0–0.2)

## 2020-02-05 LAB — COMPREHENSIVE METABOLIC PANEL
ALT: 13 U/L (ref 0–44)
AST: 25 U/L (ref 15–41)
Albumin: 4 g/dL (ref 3.5–5.0)
Alkaline Phosphatase: 62 U/L (ref 38–126)
Anion gap: 10 (ref 5–15)
BUN: 13 mg/dL (ref 8–23)
CO2: 22 mmol/L (ref 22–32)
Calcium: 8.7 mg/dL — ABNORMAL LOW (ref 8.9–10.3)
Chloride: 104 mmol/L (ref 98–111)
Creatinine, Ser: 0.84 mg/dL (ref 0.61–1.24)
GFR, Estimated: >60 mL/min (ref >60)
Glucose, Bld: 128 mg/dL — ABNORMAL HIGH (ref 70–99)
Potassium: 3.9 mmol/L (ref 3.5–5.1)
Sodium: 136 mmol/L (ref 135–145)
Total Bilirubin: 0.8 mg/dL (ref 0.3–1.2)
Total Protein: 7.7 g/dL (ref 6.5–8.1)

## 2020-02-05 LAB — HIV ANTIBODY (ROUTINE TESTING W REFLEX): HIV Screen 4th Generation wRfx: NONREACTIVE

## 2020-02-05 LAB — HEMOGLOBIN AND HEMATOCRIT, BLOOD
HCT: 38.1 % — ABNORMAL LOW (ref 39.0–52.0)
Hemoglobin: 12.8 g/dL — ABNORMAL LOW (ref 13.0–17.0)

## 2020-02-05 LAB — MAGNESIUM: Magnesium: 1.7 mg/dL (ref 1.7–2.4)

## 2020-02-05 LAB — TSH: TSH: 3.044 u[IU]/mL (ref 0.350–4.500)

## 2020-02-05 SURGERY — ENDOSCOPIC RETROGRADE CHOLANGIOPANCREATOGRAPHY (ERCP) WITH PROPOFOL
Anesthesia: General

## 2020-02-05 MED ORDER — PHENYLEPHRINE 40 MCG/ML (10ML) SYRINGE FOR IV PUSH (FOR BLOOD PRESSURE SUPPORT)
PREFILLED_SYRINGE | INTRAVENOUS | Status: DC | PRN
  Administered 2020-02-05 (×2): 80 ug via INTRAVENOUS

## 2020-02-05 MED ORDER — EPHEDRINE SULFATE-NACL 50-0.9 MG/10ML-% IV SOSY
PREFILLED_SYRINGE | INTRAVENOUS | Status: DC | PRN
  Administered 2020-02-05: 15 mg via INTRAVENOUS

## 2020-02-05 MED ORDER — MORPHINE SULFATE (PF) 2 MG/ML IV SOLN: 2.0000 mg | INTRAVENOUS | Status: DC | PRN

## 2020-02-05 MED ORDER — CIPROFLOXACIN IN D5W 400 MG/200ML IV SOLN
INTRAVENOUS | Status: AC
  Filled 2020-02-05: qty 200

## 2020-02-05 MED ORDER — SODIUM CHLORIDE 0.9 % IV SOLN: INTRAVENOUS | Status: DC

## 2020-02-05 MED ORDER — SUGAMMADEX SODIUM 200 MG/2ML IV SOLN
INTRAVENOUS | Status: DC | PRN
  Administered 2020-02-05: 200 mg via INTRAVENOUS

## 2020-02-05 MED ORDER — ROCURONIUM BROMIDE 10 MG/ML (PF) SYRINGE
PREFILLED_SYRINGE | INTRAVENOUS | Status: DC | PRN
  Administered 2020-02-05: 10 mg via INTRAVENOUS
  Administered 2020-02-05: 50 mg via INTRAVENOUS

## 2020-02-05 MED ORDER — DEXAMETHASONE SODIUM PHOSPHATE 10 MG/ML IJ SOLN
INTRAMUSCULAR | Status: DC | PRN
  Administered 2020-02-05: 10 mg via INTRAVENOUS

## 2020-02-05 MED ORDER — FENTANYL CITRATE (PF) 100 MCG/2ML IJ SOLN
INTRAMUSCULAR | Status: DC | PRN
Start: 2020-02-05 — End: 2020-02-05
  Administered 2020-02-05 (×2): 50 ug via INTRAVENOUS

## 2020-02-05 MED ORDER — INFLUENZA VAC SPLIT QUAD 0.5 ML IM SUSY
0.5000 mL | PREFILLED_SYRINGE | INTRAMUSCULAR | Status: AC
  Administered 2020-02-06: 0.5 mL via INTRAMUSCULAR
  Filled 2020-02-05: qty 0.5

## 2020-02-05 MED ORDER — MIDAZOLAM HCL 2 MG/2ML IJ SOLN
INTRAMUSCULAR | Status: AC
  Filled 2020-02-05: qty 2

## 2020-02-05 MED ORDER — ACETAMINOPHEN 325 MG PO TABS
650.0000 mg | ORAL_TABLET | Freq: Four times a day (QID) | ORAL | Status: DC | PRN
  Filled 2020-02-05: qty 2

## 2020-02-05 MED ORDER — CIPROFLOXACIN IN D5W 400 MG/200ML IV SOLN
INTRAVENOUS | Status: DC | PRN
  Administered 2020-02-05: 400 mg via INTRAVENOUS

## 2020-02-05 MED ORDER — GLUCAGON HCL RDNA (DIAGNOSTIC) 1 MG IJ SOLR
INTRAMUSCULAR | Status: AC
  Filled 2020-02-05: qty 1

## 2020-02-05 MED ORDER — ONDANSETRON HCL 4 MG/2ML IJ SOLN: 4.0000 mg | Freq: Four times a day (QID) | INTRAMUSCULAR | Status: DC | PRN

## 2020-02-05 MED ORDER — INDOMETHACIN 25 MG SUPPOSITORY
RECTAL | Status: DC | PRN
  Administered 2020-02-05: 100 mg via RECTAL

## 2020-02-05 MED ORDER — DEXTROSE-NACL 5-0.45 % IV SOLN: INTRAVENOUS | Status: DC

## 2020-02-05 MED ORDER — ACETAMINOPHEN 650 MG RE SUPP
650.0000 mg | Freq: Four times a day (QID) | RECTAL | Status: DC | PRN
  Filled 2020-02-05: qty 1

## 2020-02-05 MED ORDER — PNEUMOCOCCAL VAC POLYVALENT 25 MCG/0.5ML IJ INJ
0.5000 mL | INJECTION | INTRAMUSCULAR | Status: AC
  Administered 2020-02-06: 0.5 mL via INTRAMUSCULAR
  Filled 2020-02-05: qty 0.5

## 2020-02-05 MED ORDER — ONDANSETRON HCL 4 MG PO TABS
4.0000 mg | ORAL_TABLET | Freq: Four times a day (QID) | ORAL | Status: DC | PRN
  Filled 2020-02-05: qty 1

## 2020-02-05 MED ORDER — LIDOCAINE 2% (20 MG/ML) 5 ML SYRINGE
INTRAMUSCULAR | Status: DC | PRN
  Administered 2020-02-05: 60 mg via INTRAVENOUS

## 2020-02-05 MED ORDER — SODIUM CHLORIDE 0.9 % IV SOLN
INTRAVENOUS | Status: DC | PRN
  Administered 2020-02-05: 45 mL

## 2020-02-05 MED ORDER — ONDANSETRON HCL 4 MG/2ML IJ SOLN
INTRAMUSCULAR | Status: DC | PRN
  Administered 2020-02-05: 4 mg via INTRAVENOUS

## 2020-02-05 MED ORDER — INDOMETHACIN 50 MG RE SUPP
RECTAL | Status: AC
  Filled 2020-02-05: qty 2

## 2020-02-05 MED ORDER — SENNOSIDES-DOCUSATE SODIUM 8.6-50 MG PO TABS: 1.0000 | ORAL_TABLET | Freq: Every evening | ORAL | Status: DC | PRN

## 2020-02-05 MED ORDER — LACTATED RINGERS IV SOLN
INTRAVENOUS | Status: DC
  Administered 2020-02-05: 1000 mL via INTRAVENOUS

## 2020-02-05 MED ORDER — MAGNESIUM SULFATE 2 GM/50ML IV SOLN
2.0000 g | Freq: Once | INTRAVENOUS | Status: AC
  Administered 2020-02-05: 2 g via INTRAVENOUS
  Filled 2020-02-05: qty 50

## 2020-02-05 MED ORDER — FENTANYL CITRATE (PF) 100 MCG/2ML IJ SOLN
INTRAMUSCULAR | Status: AC
  Filled 2020-02-05: qty 2

## 2020-02-05 MED ORDER — HYDROCODONE-ACETAMINOPHEN 5-325 MG PO TABS: 1.0000 | ORAL_TABLET | ORAL | Status: DC | PRN

## 2020-02-05 MED ORDER — PROPOFOL 10 MG/ML IV BOLUS
INTRAVENOUS | Status: DC | PRN
  Administered 2020-02-05: 170 mg via INTRAVENOUS

## 2020-02-05 MED ORDER — MIDAZOLAM HCL 5 MG/5ML IJ SOLN
INTRAMUSCULAR | Status: DC | PRN
  Administered 2020-02-05: 2 mg via INTRAVENOUS

## 2020-02-05 NOTE — Consult Note (Addendum)
Patient Demographics  Tanner Thomas, is a 64 y.o. male   MRN: [id number]   DOB - 1955/08/10  Admit Date - 02/05/2020    Outpatient Primary MD for the patient is Soyla Dryer, PA-C  Consult requested in the Hospital by Milus Banister, MD, On 02/05/2020    Reason for consult:  Post procedure Sinus bradycardia   With History of -  Past Medical History:  Diagnosis Date  . Known health problems: none       Past Surgical History:  Procedure Laterality Date  . BILIARY DILATION  10/30/2019   Procedure: BILIARY DILATION;  Surgeon: Milus Banister, MD;  Location: [facility];  Service: Endoscopy;;  . BILIARY STENT PLACEMENT N/A 10/30/2019   Procedure: BILIARY STENT PLACEMENT;  Surgeon: Milus Banister, MD;  Location: [facility];  Service: Endoscopy;  Laterality: N/A;  . BIOPSY  03/06/2019   Procedure: BIOPSY;  Surgeon: Daneil Dolin, MD;  [facility];  Service: Endoscopy;;  gastric  . CHOLECYSTECTOMY N/A 09/05/2019   Procedure: LAPAROSCOPIC CHOLECYSTECTOMY;  Surgeon: Aviva Signs, MD;  Location: [facility];  Service: General;  Laterality: N/A;  . COLONOSCOPY WITH PROPOFOL N/A 03/06/2019   Procedure: COLONOSCOPY WITH PROPOFOL;  Surgeon: Daneil Dolin, MD;  [facility];  Service: Endoscopy;  Laterality: N/A;  9:45am - ok at 10:00 per [facility]  . ENDOSCOPIC RETROGRADE CHOLANGIOPANCREATOGRAPHY (ERCP) WITH PROPOFOL N/A 10/30/2019   Procedure: ENDOSCOPIC RETROGRADE CHOLANGIOPANCREATOGRAPHY (ERCP) WITH PROPOFOL;  Surgeon: Milus Banister, MD;  Location: [facility];  Service: Endoscopy;  Laterality: N/A;  . ESOPHAGOGASTRODUODENOSCOPY (EGD) WITH PROPOFOL N/A 03/06/2019   Procedure: ESOPHAGOGASTRODUODENOSCOPY (EGD) WITH PROPOFOL;  Surgeon: Daneil Dolin, MD;  [facility];  Service: Endoscopy;  Laterality: N/A;  . ESOPHAGOGASTRODUODENOSCOPY (EGD) WITH PROPOFOL N/A  08/21/2019   Procedure: ESOPHAGOGASTRODUODENOSCOPY (EGD) WITH PROPOFOL;  Surgeon: Milus Banister, MD;  Location: [facility];  Service: Endoscopy;  Laterality: N/A;  . EUS N/A 08/21/2019   Procedure: UPPER ENDOSCOPIC ULTRASOUND (EUS) RADIAL;  Surgeon: Milus Banister, MD;  Location: [facility];  Service: Endoscopy;  Laterality: N/A;  . PANCREAS SURGERY    . REMOVAL OF STONES  10/30/2019   Procedure: REMOVAL OF STONES;  Surgeon: Milus Banister, MD;  Location: [facility];  Service: Endoscopy;;  . [name]  10/30/2019   Procedure: [name];  Surgeon: Milus Banister, MD;  Location: [facility];  Service: Endoscopy;;    in for   No chief complaint on file.    HPI  Tanner Thomas  is a 64 y.o. male, h/o alcohol use with chronic calcific pancreatitis, history of cholelithiasis status post laparoscopic cholecystectomy per Dr. Arnoldo Morale , H/o residual CBD stone after lap chole in 08/2019 underwent ERCP 10/2019 Dr. Ardis Hughs with sphincterotomy, (at least partial) stone removal, had some concern for distal biliary stricture vs. residual stone, biliary stent placement 10Fr 5cm long. He is here for repeat ERCP with propofol, post procedure patient is observed to have bradycardia into the 40s,  EKG showed sinus bradycardia , blood pressure stable, he is alert oriented x4, hospitalist asked to manage bradycardia.  He denies chest pain, no dizziness, no short of breath. He also had hypothermia while down in the Endo suite, he received Bair hugger, body temperature is improving. Report last drink  alcohol use a week ago  Review of Systems    In addition to the HPI above,  No Fever-chills, No Headache, No changes with Vision or hearing, No problems swallowing food or Liquids, No Chest pain, Cough or Shortness of Breath, No Abdominal pain, No Nausea or Vommitting, Bowel movements are regular, No Blood in stool or Urine, No dysuria, No new skin rashes or bruises, No new joints pains-aches,    No new weakness, tingling, numbness in any extremity, No recent weight gain or loss, No polyuria, polydypsia or polyphagia, No significant Mental Stressors.  A full 10 point Review of Systems was done, except as stated above, all other Review of Systems were negative.   Social History Social History   Tobacco Use  . Smoking status: Current Some Day Smoker    Packs/day: 0.00    Years: 50.00    Pack years: 0.00    Types: Cigarettes    Start date: 05/24/1967  . Smokeless tobacco: Never Used  . Tobacco comment: 3 cigarettes / week  Substance Use Topics  . Alcohol use: Not Currently    Alcohol/week: 13.0 standard drinks    Types: 13 Cans of beer per week    Comment: 03/04/19 states 3-4 a week; previously: as of 02-06-19 drinks about 12-13 beers weekly     Family History Family History  Problem Relation Age of Onset  . Heart disease Father   . Colon cancer Neg Hx   . Gastric cancer Neg Hx   . Esophageal cancer Neg Hx      Prior to Admission medications   Medication Sig Start Date End Date Taking? Authorizing Provider  acetaminophen (TYLENOL) 500 MG tablet Take 1,000 mg by mouth every 6 (six) hours as needed for moderate pain.   Yes [provider]  oxyCODONE (ROXICODONE) 5 MG immediate release tablet Take 1 tablet (5 mg total) by mouth every 4 (four) hours as needed for severe pain. Patient not taking: Reported on 01/20/2020 09/05/19   Aviva Signs, MD    Anti-infectives (From admission, onward)   None      Scheduled Meds: Continuous Infusions: . sodium chloride    . dextrose 5 % and 0.45% NaCl    . lactated ringers 900 mL/hr at 02/05/20 1255   PRN Meds:.acetaminophen **OR** acetaminophen, HYDROcodone-acetaminophen, morphine injection, ondansetron **OR** ondansetron (ZOFRAN) IV, senna-docusate  No Known Allergies  Physical Exam  Vitals  Blood pressure (!) 166/83, pulse (!) 47, temperature (!) 94.1 F (34.5 C), temperature source Oral, resp. rate 11,  height 5\' 10"  (1.778 m), weight 66.2 kg, SpO2 100 %.   1. General  lying in bed in NAD  2. Normal affect and insight, Not Suicidal or Homicidal, Awake Alert, Oriented X 3.  3. No F.N deficits, ALL C.Nerves Intact, Strength 5/5 all 4 extremities, Sensation intact all 4 extremities, Plantars down going.  4. Ears and Eyes appear Normal, Conjunctivae clear, PERRLA. Moist Oral Mucosa.  5. Supple Neck, No JVD, No cervical lymphadenopathy appriciated, No Carotid Bruits.  6. Symmetrical Chest wall movement, Good air movement bilaterally, CTAB.  7. RRR, No Gallops, Rubs or Murmurs, No Parasternal Heave.  8. Positive Bowel Sounds, Abdomen Soft, No tenderness, No  organomegaly appriciated,No rebound -guarding or rigidity.  9.  No Cyanosis, Normal Skin Turgor, No Skin Rash or Bruise.  10. Good muscle tone,  joints appear normal , no effusions, Normal ROM.  11. No Palpable Lymph Nodes in Neck or Axillae    Data Review  CBC No results for input(s): WBC, HGB, HCT, PLT, MCV, MCH, MCHC, RDW, LYMPHSABS, MONOABS, EOSABS, BASOSABS, BANDABS in the last 168 hours.  Invalid input(s): NEUTRABS, BANDSABD ------------------------------------------------------------------------------------------------------------------  Chemistries  No results for input(s): NA, K, CL, CO2, GLUCOSE, BUN, CREATININE, CALCIUM, MG, AST, ALT, ALKPHOS, BILITOT in the last 168 hours.  Invalid input(s): GFRCGP ------------------------------------------------------------------------------------------------------------------ CrCl cannot be calculated (Patient's most recent lab result is older than the maximum 21 days allowed.). ------------------------------------------------------------------------------------------------------------------ No results for input(s): TSH, T4TOTAL, T3FREE, THYROIDAB in the last 72 hours.  Invalid input(s): FREET3   Coagulation profile No results for input(s): INR, PROTIME in the last 168  hours. ------------------------------------------------------------------------------------------------------------------- No results for input(s): DDIMER in the last 72 hours. -------------------------------------------------------------------------------------------------------------------  Cardiac Enzymes No results for input(s): CKMB, TROPONINI, MYOGLOBIN in the last 168 hours.  Invalid input(s): CK ------------------------------------------------------------------------------------------------------------------ Invalid input(s): POCBNP   ---------------------------------------------------------------------------------------------------------------  Urinalysis    Component Value Date/Time   COLORURINE AMBER (A) 01/16/2019 1724   APPEARANCEUR CLOUDY (A) 01/16/2019 1724   LABSPEC 1.033 (H) 01/16/2019 1724   PHURINE 6.0 01/16/2019 1724   GLUCOSEU NEGATIVE 01/16/2019 1724   HGBUR SMALL (A) 01/16/2019 1724   BILIRUBINUR NEGATIVE 01/16/2019 1724   KETONESUR NEGATIVE 01/16/2019 1724   PROTEINUR 30 (A) 01/16/2019 1724   NITRITE NEGATIVE 01/16/2019 1724   LEUKOCYTESUR LARGE (A) 01/16/2019 1724     Imaging results:   DG Abd 1 View - KUB  Result Date: 02/05/2020 CLINICAL DATA:  Post ERCP.  Acute pancreatitis. EXAM: ABDOMEN - 1 VIEW COMPARISON:  CT abdomen pelvis 01/16/2019 FINDINGS: Normal bowel gas pattern. Small pancreatic calcifications noted in the head of the pancreas. Biliary stent noted. Cholecystectomy clips. Calcifications on either side of the L3 vertebral body appear to be in the anterior abdominal wall based on prior CT. IMPRESSION: No acute abnormality. Pancreatic calcifications compatible with chronic pancreatitis. Biliary stent in good position. Electronically Signed   By: Franchot Gallo M.D.   On: 02/05/2020 13:50   DG CHEST PORT 1 VIEW  Result Date: 02/05/2020 CLINICAL DATA:  Post ERCP.  Acute pancreatitis. EXAM: PORTABLE CHEST 1 VIEW COMPARISON:  01/19/2019  FINDINGS: The heart size and mediastinal contours are within normal limits. Both lungs are clear. The visualized skeletal structures are unremarkable. Surgical staples and scarring right apex. IMPRESSION: No active disease. Electronically Signed   By: Franchot Gallo M.D.   On: 02/05/2020 13:52   DG ERCP  Result Date: 02/05/2020 CLINICAL DATA:  ERCP with sphincterotomy and stone extraction. Stent exchange for CBD stricture. EXAM: ERCP TECHNIQUE: Multiple spot images obtained with the fluoroscopic device and submitted for interpretation post-procedure. COMPARISON:  KUB 02/05/2020 FINDINGS: Plastic biliary stent in place placed in the common bile the beginning duct and contrast injected of the procedure. Wire placed into the common bile duct which is dilated with multiple filling defects consistent with calculi. Cholecystectomy clips. Metal stent was placed at the end of the procedure in the common bile duct. IMPRESSION: Dilated common bile duct with multiple filling defects compatible with stones. Plastic stent removed and metal stent placed in the common bile duct. These images were submitted for radiologic interpretation only. Please see the procedural report for the amount of contrast and the fluoroscopy time utilized.  Electronically Signed   By: Franchot Gallo M.D.   On: 02/05/2020 13:55    My personal review of EKG: Rhythm NSR, Rate  48/min, QTc 459, no Acute ST changes    Assessment & Plan  Active Problems:   Acute upper GI bleed    Sinus bradycardia, post ERCP -Likely procedure/propofol/hypothermia related -will keep on telemetry, check magnesium, potassium and TSH, he does appear dehydrated will start gentle hydration.   S/p ERCP with bile duct dilation resulted in bleeding, this was tamponade with a covered metal stent placement, management per GI.  DVT Prophylaxis  SCDs   AM Labs Ordered, also please review Full Orders  Family Communication: Plan discussed with patient     Thank you for the consult, we will follow the patient with you in the Hospital.   Florencia Reasons M.D on 02/05/2020 at 3:46 PM  Between 7am to 7pm - Pager - [phone]  After 7pm go to [email or web address] - password [email or web address]   Thank you for the consult, we will follow the patient with you in the [facility] Office  [phone]

## 2020-02-05 NOTE — Progress Notes (Signed)
EKG obtained for sustained sinus bradycardia post procedure. Amy Esterwood, PA at bedside to eval pt and admit to tele floor. Pt remains awake, alert, orientedx4. Skin w/d/pink. Resp wnl, equal and non-labored. Cont to monitor.

## 2020-02-05 NOTE — Progress Notes (Signed)
Portable chest xray and KUB obt at bedside.

## 2020-02-05 NOTE — Plan of Care (Signed)

## 2020-02-05 NOTE — H&P (Signed)
Admission Note  Primary Care Physician:  Soyla Dryer, PA-C Primary Gastroenterologist: Owens Loffler MD  HPI: Tanner Thomas is a 64 y.o. male, with history of chronic calcific pancreatitis, secondary to chronic EtOH abuse, who underwent ERCP today per Dr. Ardis Hughs for further evaluation of dilated common bile duct.  He had undergone EUS in April 2021 which suggested distal common bile duct sludge and debris.  He had interval laparoscopic cholecystectomy per Dr. Arnoldo Morale for cholelithiasis, IOC was not done at that time. He underwent ERCP July 2021 per Dr. Ardis Hughs with sphincterotomy and at least partial stone removal.  There was concern for distal biliary stricture versus residual stone.  A 10 French 5 cm biliary stent was placed. At ERCP today, previously placed biliary stent was patent and was removed, cholangiogram showed a very low cystic duct stump takeoff and a diffusely dilated CBD of 15 to 16 mm containing clear stones and debris.  There was still a question of CBD stricture about 1 cm from the major papilla.  A sphincterotomy was extended and then duct was balloon swept with removal of several black stone fragments and copious green conglomerated debris.  Duct was then evaluated via spyglass cholangioscopy which revealed a distal common bile duct stricture not overtly benign or malignant Bile duct was brushed and the stricture was biopsied.  There appeared to be residual stones and sludge above the stricture.  The stricture was then dilated to 11 mm.  Patient developed some brisk bleeding.  Balloon was reinflated and held for 3 to 4 minutes for tamponade and then a 10 mm / 6 cm long fully covered metal stent was placed to aid with tamponade and also to treat the distal stricture. Patient remained hemodynamically stable throughout the procedure. Initial labs are pending.  Decision made to admit him overnight for close observation for potential recurrent bleeding.   Past Medical History:   Diagnosis Date  . Known health problems: none     Past Surgical History:  Procedure Laterality Date  . BILIARY DILATION  10/30/2019   Procedure: BILIARY DILATION;  Surgeon: Milus Banister, MD;  Location: [facility];  Service: Endoscopy;;  . BILIARY STENT PLACEMENT N/A 10/30/2019   Procedure: BILIARY STENT PLACEMENT;  Surgeon: Milus Banister, MD;  Location: [facility];  Service: Endoscopy;  Laterality: N/A;  . BIOPSY  03/06/2019   Procedure: BIOPSY;  Surgeon: Daneil Dolin, MD;  [facility];  Service: Endoscopy;;  gastric  . CHOLECYSTECTOMY N/A 09/05/2019   Procedure: LAPAROSCOPIC CHOLECYSTECTOMY;  Surgeon: Aviva Signs, MD;  Location: [facility];  Service: General;  Laterality: N/A;  . COLONOSCOPY WITH PROPOFOL N/A 03/06/2019   Procedure: COLONOSCOPY WITH PROPOFOL;  Surgeon: Daneil Dolin, MD;  [facility];  Service: Endoscopy;  Laterality: N/A;  9:45am - ok at 10:00 per [facility]  . ENDOSCOPIC RETROGRADE CHOLANGIOPANCREATOGRAPHY (ERCP) WITH PROPOFOL N/A 10/30/2019   Procedure: ENDOSCOPIC RETROGRADE CHOLANGIOPANCREATOGRAPHY (ERCP) WITH PROPOFOL;  Surgeon: Milus Banister, MD;  Location: [facility];  Service: Endoscopy;  Laterality: N/A;  . ESOPHAGOGASTRODUODENOSCOPY (EGD) WITH PROPOFOL N/A 03/06/2019   Procedure: ESOPHAGOGASTRODUODENOSCOPY (EGD) WITH PROPOFOL;  Surgeon: Daneil Dolin, MD;  [facility];  Service: Endoscopy;  Laterality: N/A;  . ESOPHAGOGASTRODUODENOSCOPY (EGD) WITH PROPOFOL N/A 08/21/2019   Procedure: ESOPHAGOGASTRODUODENOSCOPY (EGD) WITH PROPOFOL;  Surgeon: Milus Banister, MD;  Location: [facility];  Service: Endoscopy;  Laterality: N/A;  . EUS N/A 08/21/2019   Procedure: UPPER ENDOSCOPIC ULTRASOUND (EUS) RADIAL;  Surgeon: Milus Banister, MD;  Location: [facility];  Service: Endoscopy;  Laterality: N/A;  . PANCREAS SURGERY    . REMOVAL OF STONES  10/30/2019   Procedure: REMOVAL OF STONES;  Surgeon: Milus Banister, MD;   Location: [facility];  Service: Endoscopy;;  . [name]  10/30/2019   Procedure: [name];  Surgeon: Milus Banister, MD;  Location: [facility];  Service: Endoscopy;;    Prior to Admission medications   Medication Sig Start Date End Date Taking? Authorizing Provider  acetaminophen (TYLENOL) 500 MG tablet Take 1,000 mg by mouth every 6 (six) hours as needed for moderate pain.   Yes [provider]  oxyCODONE (ROXICODONE) 5 MG immediate release tablet Take 1 tablet (5 mg total) by mouth every 4 (four) hours as needed for severe pain. Patient not taking: Reported on 01/20/2020 09/05/19   Aviva Signs, MD    Current Facility-Administered Medications  Medication Dose Route Frequency Provider Last Rate Last Admin  . 0.9 %  sodium chloride infusion   Intravenous Continuous Milus Banister, MD      . acetaminophen (TYLENOL) tablet 650 mg  650 mg Oral Q6H PRN Frank Novelo S, PA-C       Or  . acetaminophen (TYLENOL) suppository 650 mg  650 mg Rectal Q6H PRN Reyann Troop S, PA-C      . dextrose 5 %-0.45 % sodium chloride infusion   Intravenous Continuous Aideen Fenster S, PA-C      . HYDROcodone-acetaminophen (NORCO/VICODIN) 5-325 MG per tablet 1-2 tablet  1-2 tablet Oral Q4H PRN Joeph Szatkowski S, PA-C      . lactated ringers infusion   Intravenous Continuous Milus Banister, MD 900 mL/hr at 02/05/20 1255 Rate Change at 02/05/20 1255  . morphine 2 MG/ML injection 2 mg  2 mg Intravenous Q2H PRN Kwana Ringel S, PA-C      . ondansetron (ZOFRAN) tablet 4 mg  4 mg Oral Q6H PRN Nashya Garlington S, PA-C       Or  . ondansetron (ZOFRAN) injection 4 mg  4 mg Intravenous Q6H PRN Gradie Ohm S, PA-C      . senna-docusate (Senokot-S) tablet 1 tablet  1 tablet Oral QHS PRN Velencia Lenart S, PA-C        Allergies as of 12/31/2019  . (No Known Allergies)    Family History  Problem Relation Age of Onset  . Heart disease Father   . Colon cancer Neg Hx   . Gastric cancer Neg Hx    . Esophageal cancer Neg Hx     Social History   Socioeconomic History  . Marital status: Single    Spouse name: Not on file  . Number of children: Not on file  . Years of education: Not on file  . Highest education level: Not on file  Occupational History  . Not on file  Tobacco Use  . Smoking status: Current Some Day Smoker    Packs/day: 0.00    Years: 50.00    Pack years: 0.00    Types: Cigarettes    Start date: 05/24/1967  . Smokeless tobacco: Never Used  . Tobacco comment: 3 cigarettes / week  Vaping Use  . Vaping Use: Never used  Substance and Sexual Activity  . Alcohol use: Not Currently    Alcohol/week: 13.0 standard drinks    Types: 13 Cans of beer per week    Comment: 03/04/19 states 3-4 a week; previously: as of 02-06-19 drinks about 12-13 beers weekly  .  Drug use: Not Currently    Types: Marijuana    Comment: pt denies but + UDS 10/20  . Sexual activity: Yes  Other Topics Concern  . Not on file  Social History Narrative  . Not on file   Social Determinants of Health   Financial Resource Strain:   . Difficulty of Paying Living Expenses: Not on file  Food Insecurity:   . Worried About Charity fundraiser in the Last Year: Not on file  . Ran Out of Food in the Last Year: Not on file  Transportation Needs:   . Lack of Transportation (Medical): Not on file  . Lack of Transportation (Non-Medical): Not on file  Physical Activity:   . Days of Exercise per Week: Not on file  . Minutes of Exercise per Session: Not on file  Stress:   . Feeling of Stress : Not on file  Social Connections:   . Frequency of Communication with Friends and Family: Not on file  . Frequency of Social Gatherings with Friends and Family: Not on file  . Attends Religious Services: Not on file  . Active Member of Clubs or Organizations: Not on file  . Attends Archivist Meetings: Not on file  . Marital Status: Not on file  Intimate Partner Violence:   . Fear of Current or  Ex-Partner: Not on file  . Emotionally Abused: Not on file  . Physically Abused: Not on file  . Sexually Abused: Not on file    Review of Systems:  All systems reviewed an negative except where noted in HPI.    Physical Exam: Vital signs in last 24 hours: Temp:  [97.7 F (36.5 C)] 97.7 F (36.5 C) (10/14 0947) Pulse Rate:  [50-65] 50 (10/14 1330) Resp:  [11-15] 15 (10/14 1330) BP: (129-168)/(69-91) 165/80 (10/14 1320) SpO2:  [98 %-100 %] 100 % (10/14 1330) Weight:  [66.2 kg] 66.2 kg (10/14 0947)   General:  Pleasant, thin African-American male  in NAD Head:  Normocephalic and atraumatic. Eyes:  Sclera clear, no icterus.   Conjunctiva pink. Ears:  Normal auditory acuity. Mouth:  No deformity or lesions.  Neck:  Supple; no masses . Lungs:  Clear throughout to auscultation.   No wheezes, crackles, or rhonchi. No acute distress. Heart:  Regular rate and rhythm; no murmurs. Abdomen:  Soft, nondistended, mild generalized tenderness, no guarding. No masses, hepatomegaly. No obvious masses.  Normal bowel .    Rectal not done today Msk:  Symmetrical without gross deformities.. Pulses:  Normal pulses noted. Extremities:  Without edema. Neurologic:  Alert and  oriented x4;  grossly normal neurologically. Skin:  Intact without significant lesions or rashes. Cervical Nodes:  No significant cervical adenopathy. Psych:  Alert and cooperative. Normal mood and affect.  Lab Results: No results for input(s): WBC, HGB, HCT, PLT in the last 72 hours. BMET No results for input(s): NA, K, CL, CO2, GLUCOSE, BUN, CREATININE, CALCIUM in the last 72 hours. LFT No results for input(s): PROT, ALBUMIN, AST, ALT, ALKPHOS, BILITOT, BILIDIR, IBILI in the last 72 hours. PT/INR No results for input(s): LABPROT, INR in the last 72 hours. Hepatitis Panel No results for input(s): HEPBSAG, HCVAB, HEPAIGM, HEPBIGM in the last 72 hours.  Studies/Results: No results found.  Impression / Plan:   #20  64 year old African-American male with chronic calcific pancreatitis secondary to chronic EtOH abuse with common bile duct stones/debris and stricture. Patient is status post previous biliary stent placement. Outpatient ERCP done today with findings  as outlined above showing persistent common bile duct stones and debris as well as CBD stricture about 1 cm above the major papilla. Stones and sludge removed via balloon sweeping, bile duct stricture biopsied and bile duct brushed. Dilation was done of the stricture to 11 mm which resulted in bleeding.  This was treated with balloon tamponade and then placement of a fully covered metal stent.  Plan; patient admitted to overnight observation. Clear liquid diet Analgesics as needed Serial hemoglobins and transfuse as indicated. If patient has recurrent active bleed will likely need CT angio with potential embolization of culprit vessel. Patient remains hemodynamically stable without any evidence of further bleeding plan will be to discharge home in a.m.    LOS: 0 days   Darnelle Derrick EsterwoodPA-C  02/05/2020, 1:51 PM

## 2020-02-05 NOTE — Progress Notes (Signed)
Anesthesia at bedside to eval. Charge RN attempted to obtain another oral and axillary temp without success.

## 2020-02-05 NOTE — Anesthesia Procedure Notes (Signed)
Procedure Name: Intubation Date/Time: 02/05/2020 10:34 AM Performed by: Gerald Leitz, CRNA Pre-anesthesia Checklist: Patient identified, Patient being monitored, Timeout performed, Emergency Drugs available and Suction available Patient Re-evaluated:Patient Re-evaluated prior to induction Oxygen Delivery Method: Circle system utilized Preoxygenation: Pre-oxygenation with 100% oxygen Induction Type: IV induction Ventilation: Mask ventilation without difficulty Laryngoscope Size: Mac and 3 Grade View: Grade I Tube type: Oral Tube size: 7.5 mm Number of attempts: 1 Placement Confirmation: ETT inserted through vocal cords under direct vision,  positive ETCO2 and breath sounds checked- equal and bilateral Secured at: 25 cm Tube secured with: Tape Dental Injury: Teeth and Oropharynx as per pre-operative assessment  Difficulty Due To: Difficult Airway- due to anterior larynx

## 2020-02-05 NOTE — Interval H&P Note (Signed)
History and Physical Interval Note:  02/05/2020 9:31 AM  Tanner Thomas  has presented today for surgery, with the diagnosis of stent removal.  The various methods of treatment have been discussed with the patient and family. After consideration of risks, benefits and other options for treatment, the patient has consented to  Procedure(s): ENDOSCOPIC RETROGRADE CHOLANGIOPANCREATOGRAPHY (ERCP) WITH PROPOFOL (N/A) as a surgical intervention.  The patient's history has been reviewed, patient examined, no change in status, stable for surgery.  I have reviewed the patient's chart and labs.  Questions were answered to the patient's satisfaction.     Milus Banister

## 2020-02-05 NOTE — Progress Notes (Signed)
Dr. Ardis Hughs at bedside to eval. Pt to be admitted. Pt aware and family will be made aware by provider.

## 2020-02-05 NOTE — Progress Notes (Signed)
Bair Hugger applied a/o per protocol. Started at 38 degrees celcius. Pt t remains awake, alert, oriented x4. Sts "I feel a little cold" but no shivering noted. Skin cool, dry, pink. Awaits admission room. Cont to monitor.

## 2020-02-05 NOTE — Progress Notes (Addendum)
Unable to obtain oral or axillary temperature. Pt is A/Ox4. Skin cool, dry, pink. Rectal temp obt and shows 92.3. Warm blankets applied. Anesthesia made aware.

## 2020-02-05 NOTE — Anesthesia Postprocedure Evaluation (Signed)
Anesthesia Post Note  Patient: Tanner Thomas  Procedure(s) Performed: ENDOSCOPIC RETROGRADE CHOLANGIOPANCREATOGRAPHY (ERCP) WITH PROPOFOL (N/A )     Patient location during evaluation: Endoscopy Anesthesia Type: General Level of consciousness: awake and alert Pain management: pain level controlled Vital Signs Assessment: post-procedure vital signs reviewed and stable Respiratory status: spontaneous breathing, nonlabored ventilation and respiratory function stable Cardiovascular status: blood pressure returned to baseline and stable Postop Assessment: no apparent nausea or vomiting Anesthetic complications: no   No complications documented.  Last Vitals:  Vitals:   02/05/20 1300 02/05/20 1308  BP: 129/69 129/69  Pulse: (!) 57 (!) 53  Resp: 12 11  Temp:    SpO2: 100% 100%    Last Pain:  Vitals:   02/05/20 1308  TempSrc: Oral  PainSc: 0-No pain                 Lidia Collum

## 2020-02-05 NOTE — Anesthesia Preprocedure Evaluation (Addendum)
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   Endo/Other  negative endocrine ROS  Renal/GU negative Renal ROS  negative genitourinary   Musculoskeletal negative musculoskeletal ROS (+)   Abdominal   Peds  Hematology negative hematology ROS (+)   Anesthesia Other Findings   Reproductive/Obstetrics                            Anesthesia Physical Anesthesia Plan  ASA: II  Anesthesia Plan: General   Post-op Pain Management:    Induction: Intravenous  PONV Risk Score and Plan: 1 and Ondansetron, Dexamethasone, Treatment may vary due to age or medical condition and Midazolam  Airway Management Planned: Oral ETT  Additional Equipment: None  Intra-op Plan:   Post-operative Plan: Extubation in OR  Informed Consent: I have reviewed the patients History and Physical, chart, labs and discussed the procedure including the risks, benefits and alternatives for the proposed anesthesia with the patient or authorized representative who has indicated his/her understanding and acceptance.     Dental advisory given  Plan Discussed with:   Anesthesia Plan Comments:        Anesthesia Quick Evaluation

## 2020-02-05 NOTE — Op Note (Signed)
[facility] Patient Name: Tanner Thomas Procedure Date: 02/05/2020 MRN: [id number] Attending MD: Milus Banister , MD Date of Birth: 1956-03-21 CSN: [id number] Age: 64 Admit Type: Outpatient Procedure:                ERCP Indications:              Chronic calcific pancreatitis, etoh related. Recent                            dilated CBD, referred by Dr. Gala Romney and evauated by                            [facility] 4/218months ago suggested distal CBD debris,                            sludge; interval lap chole Dr. Arnoldo Morale for                            cholelithiasis, IOC not performed. Normal LFTs.                            ERCP 10/2019 Dr. Ardis Hughs with sphincterotomy, (at                            least partial) stone removal, had some concern for                            distal biliary stricture vs. residual stone,                            biliary stent placement 10Fr 5cm long. Providers:                Milus Banister, MD, Cleda Daub, RN, Tyrone Apple, Technician, Eliberto Ivory, CRNA Referring MD:             Milton Ferguson, MD Medicines:                General Anesthesia, Cipro 400 mg IV, Indomethacin                            235 mg PR Complications:            No immediate complications. Estimated blood loss:                            None Estimated Blood Loss:     Estimated blood loss: minimal Procedure:                Pre-Anesthesia Assessment:                           - Prior to the procedure, a History and Physical  was performed, and patient medications and                            allergies were reviewed. The patient's tolerance of                            previous anesthesia was also reviewed. The risks                            and benefits of the procedure and the sedation                            options and risks were discussed with the patient.                            All questions  were answered, and informed consent                            was obtained. Prior Anticoagulants: The patient has                            taken no previous anticoagulant or antiplatelet                            agents. ASA Grade Assessment: II - A patient with                            mild systemic disease. After reviewing the risks                            and benefits, the patient was deemed in                            satisfactory condition to undergo the procedure.                           After obtaining informed consent, the scope was                            passed under direct vision. Throughout the                            procedure, the patient's blood pressure, pulse, and                            oxygen saturations were monitored continuously. The                            TJF-Q180V ([id number]) Olympus Duodenoscope was                            introduced through the mouth, and used to inject  contrast into and used to cannulate the bile duct.                            The ERCP was accomplished without difficulty. The                            patient tolerated the procedure well. Scope In: Scope Out: Findings:      Scout film showed clips and a plastic biliary stent in the RUQ. A       duodenoscope was advanced to the major papilla without detailed       examination of the UGI tract. The previously placed plastic biliary       stent appeared patent and it was removed with a snare (stent to       cytology). A 44 Autotome over a .035 hydrawire was used to cannulate the       bile duct. Cholangiogram showed very low cystic duct stump takeoff,       diffusely dilated CBD (15-37mm) containing clear stones, debris. There       was still a question of CBD stricture (about 1cm from the major papilla.       I extended the sphincterotomy and then used biliary retrieval balloon       (12-13mm) to sweep the bile duct several times. This  dilvered black       stone fragement and copious green conglomerated biodebris/sludge. My       concern for distal biliary stricture remained and I used Spy Glass       direct cholangioscopy to get more information. Spy Glass confirmed a       distal CBD stricture (not overtly benign or malignant) which I sampled       with Spy Bite biopsy (one bite and then the device jammed). I sampled       the stricture further with a biliary cytology brush. Spy Glass also       clearly showed residual stone, sludge, biodebris proximal to the       stricture which I wanted to try to clear and so I dilated the stricture       with biliary dilating balloon to 90mm. There was immediate brisk       bleeding after stricure dilation (my impression was that the bleeding       was from within the bile duct rather than at the ampulla, sphinctertomy       site). I reinflated the balloon to 8-71mm and held it there for 3-4       minutes as a tamponade. This seemed to slow the bleeding fairly well and       then I placed a 55mm diameter 6cm long fully covered metal stent to       continue tamponade and to also treat the distal stricture. Impression:               - Distal bile duct stricture (neoplastic vs. from                            known calcific chronic pancreatitis vs. other? The                            stricture was sampled with Spy Bite, brush cytology  and the previous biliary stent was sent to cytology                            as well.                           - CBD stones, debris, sludge; this was partiall                            treated again with balloon sweeping.                           - 6cm long 1mm diameter fully covered SEMS was                            placed to treat the distal CBD stricture and to                            treat dilation related bleeding. Moderate Sedation:      Not Applicable - Patient had care per Anesthesia. Recommendation:            - Admit for overnight observation. CBC tonight and                            tomorrow AM.                           - If he has significant bleeding will need to                            consider angiography.                           - KUB and CXR now.                           - Clear liquids. Procedure Code(s):        --- Professional ---                           [id number], Endoscopic retrograde                            cholangiopancreatography (ERCP); with                            sphincterotomy/papillotomy Diagnosis Code(s):        --- Professional ---                           K80.50, Calculus of bile duct without cholangitis                            or cholecystitis without obstruction CPT copyright 2019 American Medical Association. All rights reserved. The codes documented in this report are preliminary and upon coder review may  be revised to meet current compliance requirements. Quillian Quince  Merrily Brittle, MD 02/05/2020 1:17:35 PM This report has been signed electronically. Number of Addenda: 0

## 2020-02-05 NOTE — Transfer of Care (Signed)
Immediate Anesthesia Transfer of Care Note  Patient: Tanner Thomas  Procedure(s) Performed: Procedure(s): ENDOSCOPIC RETROGRADE CHOLANGIOPANCREATOGRAPHY (ERCP) WITH PROPOFOL (N/A)  Patient Location: PACU  Anesthesia Type:General  Level of Consciousness: Alert, Awake, Oriented  Airway & Oxygen Therapy: Patient Spontanous Breathing  Post-op Assessment: Report given to RN  Post vital signs: Reviewed and stable  Last Vitals:  Vitals:   02/05/20 0947  BP: (!) 168/91  Pulse: 65  Resp: 13  Temp: 36.5 C  SpO2: 06%    Complications: No apparent anesthesia complications

## 2020-02-06 DIAGNOSIS — K922 Gastrointestinal hemorrhage, unspecified: Secondary | ICD-10-CM

## 2020-02-06 DIAGNOSIS — R001 Bradycardia, unspecified: Secondary | ICD-10-CM

## 2020-02-06 LAB — HEMOGLOBIN AND HEMATOCRIT, BLOOD
HCT: 40.1 % (ref 39.0–52.0)
Hemoglobin: 13.1 g/dL (ref 13.0–17.0)

## 2020-02-06 LAB — SURGICAL PATHOLOGY

## 2020-02-06 LAB — CYTOLOGY - NON PAP

## 2020-02-06 NOTE — Discharge Summary (Signed)
[facility] Discharge Summary  Name: Tanner Thomas MRN: [id number] DOB: September 27, 1955 64 y.o. PCP:  Soyla Dryer, PA-C  Date of Admission: 02/05/2020  9:09 AM Date of Discharge: 02/06/2020 Attending Physician: Milus Banister, MD  Discharge Diagnosis: Active Problems:   Acute upper GI bleed   Sinus bradycardia Chronic calcific pancreatitis, choledocholithiasis and CBD stricture  Consultations: [facility] hospitalist medicine service  Procedures Performed:  DG Abd 1 View - KUB  Result Date: 02/05/2020 CLINICAL DATA:  Post ERCP.  Acute pancreatitis. EXAM: ABDOMEN - 1 VIEW COMPARISON:  CT abdomen pelvis 01/16/2019 FINDINGS: Normal bowel gas pattern. Small pancreatic calcifications noted in the head of the pancreas. Biliary stent noted. Cholecystectomy clips. Calcifications on either side of the L3 vertebral body appear to be in the anterior abdominal wall based on prior CT. IMPRESSION: No acute abnormality. Pancreatic calcifications compatible with chronic pancreatitis. Biliary stent in good position. Electronically Signed   By: Franchot Gallo M.D.   On: 02/05/2020 13:50   DG CHEST PORT 1 VIEW  Result Date: 02/05/2020 CLINICAL DATA:  Post ERCP.  Acute pancreatitis. EXAM: PORTABLE CHEST 1 VIEW COMPARISON:  01/19/2019 FINDINGS: The heart size and mediastinal contours are within normal limits. Both lungs are clear. The visualized skeletal structures are unremarkable. Surgical staples and scarring right apex. IMPRESSION: No active disease. Electronically Signed   By: Franchot Gallo M.D.   On: 02/05/2020 13:52   DG ERCP  Result Date: 02/05/2020 CLINICAL DATA:  ERCP with sphincterotomy and stone extraction. Stent exchange for CBD stricture. EXAM: ERCP TECHNIQUE: Multiple spot images obtained with the fluoroscopic device and submitted for interpretation post-procedure. COMPARISON:  KUB 02/05/2020 FINDINGS: Plastic biliary stent in place placed in the common bile the beginning duct  and contrast injected of the procedure. Wire placed into the common bile duct which is dilated with multiple filling defects consistent with calculi. Cholecystectomy clips. Metal stent was placed at the end of the procedure in the common bile duct. IMPRESSION: Dilated common bile duct with multiple filling defects compatible with stones. Plastic stent removed and metal stent placed in the common bile duct. These images were submitted for radiologic interpretation only. Please see the procedural report for the amount of contrast and the fluoroscopy time utilized. Electronically Signed   By: Franchot Gallo M.D.   On: 02/05/2020 13:55    GI Procedures: ERCP on day of admit  History/Physical Exam:  See Admission H&P    Hospital Course by problem list: Active Problems:   Acute upper GI bleed   Sinus bradycardia 64 year old African-American male with history of chronic calcific pancreatitis secondary to chronic EtOH abuse, admitted after undergoing ERCP per Dr. Ardis Hughs which was being done for further evaluation of dilated common bile duct, and probably choledocholithiasis. Patient had prior ERCP July 2021 per Dr. Ardis Hughs with sphincterotomy and at least a partial stone removal, there was concern for distal biliary stricture versus residual stone and a 10 French 5 cm biliary stent was placed. At ERCP on the day of admission the previously placed biliary stent was patent and removed, cholangiogram showed a diffusely dilated CBD of 15 to 16 mm containing clear stones and debris.  Duct was balloon swept.  There was still a question of CBD stricture about 1 cm from the major papilla.  Sphincterotomy was extended and further stones and sludge removed.  He then had spyglass cholangioscopy revealing the biliary stricture which was not overtly benign or malignant.  The bile duct was brushed and the stricture was  biopsied.  The stricture was dilated from 10 to 11 mm.  Patient developed brisk bleeding from the  stricture site.  Balloon was reinflated and held for tamponade effect and then a covered 10 mm / 6 cm long metal stent was placed for additional tamponade and also to treat the distal stricture. Patient remained hemodynamically stable throughout the procedure. Postoperatively he did have hypothermia with a rectal temp of 92.  This gradually resolved over the next few hours with bear hugger.  He was also bradycardic in the 40s.  EKG revealed sinus rhythm.  Over the next few hours pulse gradually back to normal range of low 60s.  He was seen in consultation by [facility] hospitalist, monitored on telemetry.  Did not require any further intervention. Hemoglobin post procedure was 13.6, LFTs within normal limits  Hemoglobin 13.1 this a.m. stable  Patient feels well this morning, no complaints of abdominal discomfort, tolerating a solid diet and is stable for discharge to home.   Discharge Vitals:  BP 137/88 (BP Location: Left Arm)    Pulse (!) 59    Temp 98.6 F (37 C) (Oral)    Resp 18    Ht 5\' 10"  (1.778 m)    Wt 66.2 kg    SpO2 99%    BMI 20.95 kg/m   Discharge Labs:  Results for orders placed or performed during the hospital encounter of 02/05/20 (from the past 24 hour(s))  HIV Antibody (routine testing w rflx)     Status: None   Collection Time: 02/05/20  4:20 PM  Result Value Ref Range   HIV Screen 4th Generation wRfx Non Reactive Non Reactive  Comprehensive metabolic panel     Status: Abnormal   Collection Time: 02/05/20  4:20 PM  Result Value Ref Range   Sodium 136 135 - 145 mmol/L   Potassium 3.9 3.5 - 5.1 mmol/L   Chloride 104 98 - 111 mmol/L   CO2 22 22 - 32 mmol/L   Glucose, Bld 128 (H) 70 - 99 mg/dL   BUN 13 8 - 23 mg/dL   Creatinine, Ser 0.84 0.61 - 1.24 mg/dL   Calcium 8.7 (L) 8.9 - 10.3 mg/dL   Total Protein 7.7 6.5 - 8.1 g/dL   Albumin 4.0 3.5 - 5.0 g/dL   AST 25 15 - 41 U/L   ALT 13 0 - 44 U/L   Alkaline Phosphatase 62 38 - 126 U/L   Total Bilirubin 0.8 0.3 - 1.2 mg/dL    GFR, Estimated >60 >60 mL/min   Anion gap 10 5 - 15  CBC WITH DIFFERENTIAL     Status: Abnormal   Collection Time: 02/05/20  4:20 PM  Result Value Ref Range   WBC 8.0 4.0 - 10.5 K/uL   RBC 4.43 4.22 - 5.81 MIL/uL   Hemoglobin 13.6 13.0 - 17.0 g/dL   HCT 41.8 39 - 52 %   MCV 94.4 80.0 - 100.0 fL   MCH 30.7 26.0 - 34.0 pg   MCHC 32.5 30.0 - 36.0 g/dL   RDW 13.6 11.5 - 15.5 %   Platelets 112 (L) 150 - 400 K/uL   nRBC 0.0 0.0 - 0.2 %   Neutrophils Relative % 91 %   Neutro Abs 7.3 1.7 - 7.7 K/uL   Lymphocytes Relative 8 %   Lymphs Abs 0.6 (L) 0.7 - 4.0 K/uL   Monocytes Relative 1 %   Monocytes Absolute 0.1 0.1 - 1.0 K/uL   Eosinophils Relative 0 %  Eosinophils Absolute 0.0 0.0 - 0.5 K/uL   Basophils Relative 0 %   Basophils Absolute 0.0 0.0 - 0.1 K/uL   Immature Granulocytes 0 %   Abs Immature Granulocytes 0.02 0.00 - 0.07 K/uL  Magnesium     Status: None   Collection Time: 02/05/20  4:20 PM  Result Value Ref Range   Magnesium 1.7 1.7 - 2.4 mg/dL  TSH     Status: None   Collection Time: 02/05/20  4:20 PM  Result Value Ref Range   TSH 3.044 0.350 - 4.500 uIU/mL  Hemoglobin and hematocrit, blood     Status: Abnormal   Collection Time: 02/05/20  9:19 PM  Result Value Ref Range   Hemoglobin 12.8 (L) 13.0 - 17.0 g/dL   HCT 38.1 (L) 39 - 52 %  Hemoglobin and hematocrit, blood     Status: None   Collection Time: 02/06/20  4:29 AM  Result Value Ref Range   Hemoglobin 13.1 13.0 - 17.0 g/dL   HCT 40.1 39 - 52 %    Disposition and follow-up:   Mr.Worthy W Bossard was discharged from [facility] in stable condition.    Follow-up Appointments: Office follow-up will be arranged with Dr. Ardis Hughs, once biopsies and brushings result.   Discharge Medications: Acetaminophen 325 mg 1-2 p.o. every 8 hours as needed for pain  Signed: Carynn Felling PA-C 02/06/2020, 10:36 AM

## 2020-02-06 NOTE — Discharge Instructions (Signed)
Dr. Ardis Hughs nurse will call you early next week for follow-up appointment with Dr. Ardis Hughs. Please call the office for any problems in the interim

## 2020-02-06 NOTE — Progress Notes (Signed)
Patient is seen and examined, body temperature and heart rate back to baseline, he denies new complaints, labs unremarkable, recommend follow up with pcp.

## 2020-02-06 NOTE — Progress Notes (Addendum)
Patient given discharge, follow up, immunization, and medication instructions from AVS, verbalized understanding, IV and telemetry monitor removed, personal belongings with patient, will have family member to transport home once AVS can be printed out

## 2020-02-06 NOTE — Progress Notes (Signed)
[facility] Progress Note    Since last GI note: He feels well. Slept well. He is hungry.  We had a nice discussion about his ERCP yesterday, the nature of his bile duct stricture (chronic panc vs. Neoplasm).  Temp better and HR in 50s-60s  Objective: Vital signs in last 24 hours: Temp:  [92.3 F (33.5 C)-98.6 F (37 C)] 98.6 F (37 C) (10/15 0624) Pulse Rate:  [45-65] 59 (10/15 0624) Resp:  [10-18] 18 (10/15 0624) BP: (129-168)/(69-91) 137/88 (10/15 0624) SpO2:  [98 %-100 %] 99 % (10/15 0624) Weight:  [66.2 kg] 66.2 kg (10/14 0947) Last BM Date: 02/04/20 General: alert and oriented times 3 Heart: regular rate and rythm Abdomen: soft, non-tender, non-distended, normal bowel sounds  Lab Results: Recent Labs    02/05/20 1620 02/05/20 2119 02/06/20 0429  WBC 8.0  --   --   HGB 13.6 12.8* 13.1  PLT 112*  --   --   MCV 94.4  --   --    Recent Labs    02/05/20 1620  NA 136  K 3.9  CL 104  CO2 22  GLUCOSE 128*  BUN 13  CREATININE 0.84  CALCIUM 8.7*   Recent Labs    02/05/20 1620  PROT 7.7  ALBUMIN 4.0  AST 25  ALT 13  ALKPHOS 62  BILITOT 0.8   Medications: Scheduled Meds: . influenza vac split quadrivalent PF  0.5 mL Intramuscular Tomorrow-1000  . pneumococcal 23 valent vaccine  0.5 mL Intramuscular Tomorrow-1000   Continuous Infusions: . dextrose 5 % and 0.45% NaCl 100 mL/hr at 02/06/20 0451   PRN Meds:.acetaminophen **OR** acetaminophen, HYDROcodone-acetaminophen, morphine injection, ondansetron **OR** ondansetron (ZOFRAN) IV, senna-docusate  Assessment/Plan: 64 y.o. male with complicated ERCP yesterday and post op sinus brady, hypothermia  He never felt poorly at all after his procedure, slept well and is eager to eat breakfast and go home this morning. We will arrange.  After his bile duct stricture biopsies (previous stent, Spy Bite and cytology brushing) have returned I will contact him with plan for repeat ERCP (in 3-4 months if  no sign of neoplasm).  I may want repeat imaging at some point as well   Milus Banister, MD  02/06/2020, 6:41 AM [facility] Pager [phone]

## 2020-02-06 NOTE — Plan of Care (Signed)
  Problem: Education: Goal: Ability to identify signs and symptoms of gastrointestinal bleeding will improve Outcome: Adequate for Discharge   Problem: Bowel/Gastric: Goal: Will show no signs and symptoms of gastrointestinal bleeding Outcome: Adequate for Discharge   Problem: Fluid Volume: Goal: Will show no signs and symptoms of excessive bleeding Outcome: Adequate for Discharge   Problem: Clinical Measurements: Goal: Complications related to the disease process, condition or treatment will be avoided or minimized Outcome: Adequate for Discharge   Problem: Education: Goal: Knowledge of General Education information will improve Description: Including pain rating scale, medication(s)/side effects and non-pharmacologic comfort measures Outcome: Adequate for Discharge   Problem: Health Behavior/Discharge Planning: Goal: Ability to manage health-related needs will improve Outcome: Adequate for Discharge   Problem: Clinical Measurements: Goal: Ability to maintain clinical measurements within normal limits will improve Outcome: Adequate for Discharge Goal: Will remain free from infection Outcome: Adequate for Discharge Goal: Diagnostic test results will improve Outcome: Adequate for Discharge Goal: Respiratory complications will improve Outcome: Adequate for Discharge Goal: Cardiovascular complication will be avoided Outcome: Adequate for Discharge   Problem: Activity: Goal: Risk for activity intolerance will decrease Outcome: Adequate for Discharge   Problem: Nutrition: Goal: Adequate nutrition will be maintained Outcome: Adequate for Discharge   Problem: Coping: Goal: Level of anxiety will decrease Outcome: Adequate for Discharge   Problem: Elimination: Goal: Will not experience complications related to bowel motility Outcome: Adequate for Discharge Goal: Will not experience complications related to urinary retention Outcome: Adequate for Discharge   Problem: Pain  Managment: Goal: General experience of comfort will improve Outcome: Adequate for Discharge   Problem: Safety: Goal: Ability to remain free from injury will improve Outcome: Adequate for Discharge   Problem: Skin Integrity: Goal: Risk for impaired skin integrity will decrease Outcome: Adequate for Discharge   

## 2020-02-09 ENCOUNTER — Other Ambulatory Visit: Payer: Self-pay

## 2020-02-09 ENCOUNTER — Encounter: Payer: Self-pay | Admitting: Orthopedic Surgery

## 2020-02-09 ENCOUNTER — Encounter (HOSPITAL_COMMUNITY): Payer: Self-pay | Admitting: Gastroenterology

## 2020-02-09 DIAGNOSIS — K838 Other specified diseases of biliary tract: Secondary | ICD-10-CM

## 2020-04-26 ENCOUNTER — Other Ambulatory Visit: Payer: Self-pay | Admitting: Physician Assistant

## 2020-04-26 DIAGNOSIS — E46 Unspecified protein-calorie malnutrition: Secondary | ICD-10-CM

## 2020-04-26 DIAGNOSIS — Z125 Encounter for screening for malignant neoplasm of prostate: Secondary | ICD-10-CM

## 2020-04-26 DIAGNOSIS — D649 Anemia, unspecified: Secondary | ICD-10-CM

## 2020-04-26 DIAGNOSIS — D696 Thrombocytopenia, unspecified: Secondary | ICD-10-CM

## 2020-04-26 DIAGNOSIS — R7303 Prediabetes: Secondary | ICD-10-CM

## 2020-04-26 DIAGNOSIS — R739 Hyperglycemia, unspecified: Secondary | ICD-10-CM

## 2020-05-03 DIAGNOSIS — C022 Malignant neoplasm of ventral surface of tongue: Secondary | ICD-10-CM | POA: Diagnosis not present

## 2020-05-03 DIAGNOSIS — C029 Malignant neoplasm of tongue, unspecified: Secondary | ICD-10-CM | POA: Diagnosis not present

## 2020-05-03 DIAGNOSIS — C049 Malignant neoplasm of floor of mouth, unspecified: Secondary | ICD-10-CM | POA: Diagnosis not present

## 2020-05-04 ENCOUNTER — Other Ambulatory Visit: Payer: Self-pay | Admitting: Otolaryngology

## 2020-05-04 DIAGNOSIS — R220 Localized swelling, mass and lump, head: Secondary | ICD-10-CM

## 2020-05-04 DIAGNOSIS — D3709 Neoplasm of uncertain behavior of other specified sites of the oral cavity: Secondary | ICD-10-CM

## 2020-05-05 ENCOUNTER — Ambulatory Visit
Admission: RE | Admit: 2020-05-05 | Discharge: 2020-05-05 | Disposition: A | Discharge status: Home / Self Care | Payer: Self-pay | Source: Ambulatory Visit | Attending: Otolaryngology | Admitting: Otolaryngology

## 2020-05-05 DIAGNOSIS — R220 Localized swelling, mass and lump, head: Secondary | ICD-10-CM

## 2020-05-05 DIAGNOSIS — D3709 Neoplasm of uncertain behavior of other specified sites of the oral cavity: Secondary | ICD-10-CM

## 2020-05-05 MED ORDER — IOPAMIDOL (ISOVUE-300) INJECTION 61%
75.0000 mL | Freq: Once | INTRAVENOUS | Status: AC | PRN
  Administered 2020-05-05: 75 mL via INTRAVENOUS

## 2020-05-11 ENCOUNTER — Ambulatory Visit: Payer: Self-pay | Admitting: Physician Assistant

## 2020-05-17 ENCOUNTER — Ambulatory Visit: Payer: Self-pay | Admitting: Physician Assistant

## 2020-05-24 DIAGNOSIS — C049 Malignant neoplasm of floor of mouth, unspecified: Secondary | ICD-10-CM | POA: Diagnosis not present

## 2020-05-25 ENCOUNTER — Encounter: Payer: Self-pay | Admitting: Physician Assistant

## 2020-05-26 DIAGNOSIS — C049 Malignant neoplasm of floor of mouth, unspecified: Secondary | ICD-10-CM | POA: Insufficient documentation

## 2020-05-26 DIAGNOSIS — K118 Other diseases of salivary glands: Secondary | ICD-10-CM | POA: Insufficient documentation

## 2020-05-26 DIAGNOSIS — R591 Generalized enlarged lymph nodes: Secondary | ICD-10-CM | POA: Insufficient documentation

## 2020-05-26 HISTORY — DX: Generalized enlarged lymph nodes: R59.1

## 2020-06-02 DIAGNOSIS — C049 Malignant neoplasm of floor of mouth, unspecified: Secondary | ICD-10-CM | POA: Diagnosis not present

## 2020-06-02 DIAGNOSIS — C069 Malignant neoplasm of mouth, unspecified: Secondary | ICD-10-CM | POA: Diagnosis not present

## 2020-06-04 DIAGNOSIS — Z01812 Encounter for preprocedural laboratory examination: Secondary | ICD-10-CM | POA: Diagnosis not present

## 2020-06-04 DIAGNOSIS — C049 Malignant neoplasm of floor of mouth, unspecified: Secondary | ICD-10-CM | POA: Diagnosis not present

## 2020-06-29 DIAGNOSIS — K029 Dental caries, unspecified: Secondary | ICD-10-CM | POA: Diagnosis not present

## 2020-06-29 DIAGNOSIS — C039 Malignant neoplasm of gum, unspecified: Secondary | ICD-10-CM | POA: Diagnosis not present

## 2020-06-29 DIAGNOSIS — C770 Secondary and unspecified malignant neoplasm of lymph nodes of head, face and neck: Secondary | ICD-10-CM | POA: Diagnosis not present

## 2020-06-29 DIAGNOSIS — K089 Disorder of teeth and supporting structures, unspecified: Secondary | ICD-10-CM | POA: Diagnosis not present

## 2020-06-29 DIAGNOSIS — C049 Malignant neoplasm of floor of mouth, unspecified: Secondary | ICD-10-CM | POA: Diagnosis not present

## 2020-06-29 DIAGNOSIS — C048 Malignant neoplasm of overlapping sites of floor of mouth: Secondary | ICD-10-CM | POA: Diagnosis not present

## 2020-06-29 DIAGNOSIS — Z4659 Encounter for fitting and adjustment of other gastrointestinal appliance and device: Secondary | ICD-10-CM | POA: Diagnosis not present

## 2020-07-20 DIAGNOSIS — C049 Malignant neoplasm of floor of mouth, unspecified: Secondary | ICD-10-CM | POA: Diagnosis not present

## 2020-07-22 ENCOUNTER — Ambulatory Visit
Admission: RE | Admit: 2020-07-22 | Discharge: 2020-07-22 | Disposition: A | Discharge status: Home / Self Care | Payer: Self-pay | Source: Ambulatory Visit | Attending: Radiation Oncology | Admitting: Radiation Oncology

## 2020-07-22 ENCOUNTER — Other Ambulatory Visit: Payer: Self-pay

## 2020-07-22 DIAGNOSIS — C049 Malignant neoplasm of floor of mouth, unspecified: Secondary | ICD-10-CM

## 2020-07-22 NOTE — Progress Notes (Signed)
Oncology Nurse Navigator Documentation  +++Faxed request to [facility] Library for the following imaging to be pushed to [employer]:  . 06/02/20 PET Notification of successful fax transmission received. Order placed for imaging to be assigned to [facility] timeline.  Harlow Asa, RN, BSN, OCN Head & Neck Oncology Nurse [place] at [place] [phone]

## 2020-07-28 DIAGNOSIS — J9382 Other air leak: Secondary | ICD-10-CM | POA: Diagnosis not present

## 2020-07-28 DIAGNOSIS — C049 Malignant neoplasm of floor of mouth, unspecified: Secondary | ICD-10-CM | POA: Diagnosis not present

## 2020-07-28 DIAGNOSIS — Z930 Tracheostomy status: Secondary | ICD-10-CM | POA: Diagnosis not present

## 2020-07-28 NOTE — Progress Notes (Signed)
Oncology Nurse Navigator Documentation  Placed introductory call to new referral patient Tanner Thomas. I spoke with his sister/care-giver Tanner Thomas.   Introduced myself as the H&N oncology nurse navigator that works with Dr. Isidore Moos to whom he has been referred by Dr. Tharon Aquas. She confirmed understanding of referral.  Briefly explained my role as his navigator, provided my contact information.   Confirmed understanding of upcoming appts and [place] location, explained arrival and registration process.  I explained the purpose of a dental evaluation prior to starting RT, indicated he would be contacted by [facility] DM to arrange an appt.    I encouraged them to call with questions/concerns as he moves forward with appts and procedures.    She verbalized understanding of information provided, expressed appreciation for my call.   Navigator Initial Assessment . Employment Status: he is not employed . Currently on FMLA / STD:no . Living Situation:He lives with his sister Tanner Thomas since his surgery was completed on 06/29/20 . Support System: sister . ZOX:WRUEAVW McElroy PA . UJW:JXBJ . Financial Concerns:no . Transportation Needs: no . Sensory Deficits:no . Language Barriers/Interpreter Needed:  no . Ambulation Needs: no . DME Used in Home: no . Psychosocial Needs:  no . Concerns/Needs Understanding Cancer:  addressed/answered by navigator to best of ability . Self-Expressed Needs: no   Harlow Asa RN, BSN, OCN Head & Neck Oncology Nurse [place] at [facility] Phone # [phone]  Fax # [phone]

## 2020-07-29 ENCOUNTER — Encounter: Payer: Self-pay | Admitting: General Practice

## 2020-07-29 NOTE — Progress Notes (Signed)
[facility] Work  Initial Assessment   Tanner Thomas is a 65 y.o. year old male contacted by phone, spoke w sister as patient is not able to talk well on phone. . Clinical Social Work was referred by Francesco Sor, nurse navigator, for assessment of psychosocial needs.   SDOH (Social Determinants of Health) assessments performed: Yes SDOH Interventions   Flowsheet Row Most Recent Value  SDOH Interventions   Housing Interventions Intervention Not Indicated  [Currently staying w sister, has stayed w various family members in the area]      Distress Screen completed: No No flowsheet data found.    Family/Social Information:  . Housing Arrangement: patient lives with sister - lived with other family members in the past few months, sister is caring for him while he is recovering from surgery.  Has lived in [place] and [place] area, but moved back here 3 years ago.  . Family members/support persons in your life? Sister and others in this local area . Transportation concerns: sister is working, has missed a lot of days due to taking patient to/from appointments.  Per sister, she has talked with [place] Medicaid transport - "they don't transport every day, they only come to [facility] once/week."  Patient can be referred to [facility].  CSW will also investigate options for Medicaid transport to verify their arrangements.  . Employment: Unemployed, has not worked in 34 - 6 years due to other health problems.  May have disability, does receive small check, sister will double check with Social Security Administration to determine his status with disability.   . Income source: family support, used to have [employer], "he gets check for small amount of money, started in Jan/Feb of 2022"; "he thinks it is SSI, it is on a card."  When patient went to appointment yesterday at [facility], he was told he has Medicaid, has not received card to date.   . Financial concerns: Yes, current  concerns o Type of concern: Utilities, Rent/ mortgage, Barrister's clerk, Economist . Food access concerns: Has Food Stamps, uses feeding tube, tube came  Out and was replaced by [facility] MD.   . Religious or spiritual practice: "when he was younger he was involved in church", [facility] . Medication Concerns: no insurance, uses the "little money he has" . Services Currently in place:  None, goes to [facility] in [place] when he was uninsured.   Coping/ Adjustment to diagnosis: . Patient understands treatment plan and what happens next?  Marland Kitchen Recently diagnosed with head/neck cancer, had surgery at [facility], referred to [facility] for radiation.  Has initial appointment w Dr Isidore Moos on 08/18/20.  Currently healing from surgery . Concerns about diagnosis and/or treatment: sister reports he is healing from surgery, no concerns other than financial at this point, appears to be losing weight, concerns about affording tube feeding supplements without insurance.  At this point, [facility] has been providing these at no charge to patient.  . 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 Hopes and priorities: recovery . Patient enjoys time with family/ friends and "just sits at home, watches TV, sits outside, watching sports" . Current coping skills/ strengths: Supportive family/friends    SUMMARY: Current SDOH Barriers:  . Transportation and limited income, lack of insurance, lack of regular and reliable transportation  Interventions: . Discussed common feeling and emotions when being diagnosed with cancer, and the importance of support during treatment . Informed patient of the support team roles and support  services at [facility] . Provided CSW contact information and encouraged patient to call with any questions or concerns . Referred patient to Social Security Administration and DSS - sister advised to call both places on patient behalf to determine his status w both programs.     Follow Up Plan: CSW will follow-up with patient by phone , will call sister after patient initiates treatment at [facility] and/or if new information is received.   Patient verbalizes understanding of plan: Yes, sister    Beverely Pace , [place], LCSW Clinical Social Worker Phone:  [phone]

## 2020-07-30 ENCOUNTER — Telehealth (HOSPITAL_COMMUNITY): Payer: Self-pay

## 2020-07-30 NOTE — Telephone Encounter (Signed)
I called to schedule patient for New Outpatient Consultation with Dental Medicine. LMOM for patient to return call to Dental Medicine to get that appt scheduled.

## 2020-08-06 ENCOUNTER — Encounter: Payer: Self-pay | Admitting: General Practice

## 2020-08-06 NOTE — Progress Notes (Signed)
[facility] CSW Progress Notes  Call from caregiver/sister, she has questions about transportation to appointments.  He has been referred to [employer] - sister given contact number for this service so she can schedule rides.  Appears he has Family Planning MEdicaid - sister encouraged to contact [place] in order to investigate/apply for full Medicaid given patient's cancer diagnosis.    Edwyna Shell, LCSW Clinical Social Worker Phone:  [phone]

## 2020-08-10 ENCOUNTER — Telehealth: Payer: Self-pay | Admitting: Physician Assistant

## 2020-08-10 NOTE — Telephone Encounter (Signed)
   Tanner Thomas DOB: 1955-08-30 MRN: [id number]   RIDER WAIVER AND RELEASE OF LIABILITY  For purposes of improving physical access to our facilities, [facility] is pleased to partner with third parties to provide [facility] patients or other authorized individuals the option of convenient, on-demand ground transportation services (the Technical brewer") through use of the technology service that enables users to request on-demand ground transportation from independent third-party providers.  By opting to use and accept these [employer], I, the undersigned, hereby agree on behalf of myself, and on behalf of any minor child using the [employer] for whom I am the parent or legal guardian, as follows:  1. Government social research officer provided to me are provided by independent third-party transportation providers who are not [employer] or employees and who are unaffiliated with [employer]. 2. [facility] is neither a transportation carrier nor a common or public carrier. 3. [facility] has no control over the quality or safety of the transportation that occurs as a result of the [employer]. 4. [facility] cannot guarantee that any third-party transportation provider will complete any arranged transportation service. 5. [facility] makes no representation, warranty, or guarantee regarding the reliability, timeliness, quality, safety, suitability, or availability of any of the Transport Services or that they will be error free. 6. I fully understand that traveling by vehicle involves risks and dangers of serious bodily injury, including permanent disability, paralysis, and death. I agree, on behalf of myself and on behalf of any minor child using the Transport Services for whom I am the parent or legal guardian, that the entire risk arising out of my use of the [employer] remains solely with me, to the maximum extent permitted under applicable law. 7. The [employer] are provided "as is" and "as available." [facility] disclaims all representations and warranties, express, implied or statutory, not expressly set out in these terms, including the implied warranties of merchantability and fitness for a particular purpose. 8. I hereby waive and release [facility], its agents, employees, officers, directors, representatives, insurers, attorneys, assigns, successors, subsidiaries, and affiliates from any and all past, present, or future claims, demands, liabilities, actions, causes of action, or suits of any kind directly or indirectly arising from acceptance and use of the [employer]. 9. I further waive and release [facility] and its affiliates from all present and future liability and responsibility for any injury or death to persons or damages to property caused by or related to the use of the [employer]. 10. I have read this Waiver and Release of Liability, and I understand the terms used in it and their legal significance. This Waiver is freely and voluntarily given with the understanding that my right (as well as the right of any minor child for whom I am the parent or legal guardian using the [employer]) to legal recourse against [facility] [place] in connection with the [employer] is knowingly surrendered in return for use of these services.   I attest that I read the consent document to Tanner Thomas, gave Mr. Tanner Thomas the opportunity to ask questions and answered the questions asked (if any). I affirm that Tanner Thomas then provided consent for he's participation in this program.     Tanner Thomas

## 2020-08-11 DIAGNOSIS — C049 Malignant neoplasm of floor of mouth, unspecified: Secondary | ICD-10-CM | POA: Diagnosis not present

## 2020-08-11 DIAGNOSIS — Z9889 Other specified postprocedural states: Secondary | ICD-10-CM | POA: Diagnosis not present

## 2020-08-11 DIAGNOSIS — Z98818 Other dental procedure status: Secondary | ICD-10-CM | POA: Diagnosis not present

## 2020-08-11 DIAGNOSIS — Z9049 Acquired absence of other specified parts of digestive tract: Secondary | ICD-10-CM | POA: Diagnosis not present

## 2020-08-11 DIAGNOSIS — Z931 Gastrostomy status: Secondary | ICD-10-CM | POA: Diagnosis not present

## 2020-08-16 ENCOUNTER — Other Ambulatory Visit (HOSPITAL_COMMUNITY): Payer: Medicaid Other | Admitting: Dentistry

## 2020-08-16 DIAGNOSIS — R29898 Other symptoms and signs involving the musculoskeletal system: Secondary | ICD-10-CM | POA: Insufficient documentation

## 2020-08-16 DIAGNOSIS — S1190XA Unspecified open wound of unspecified part of neck, initial encounter: Secondary | ICD-10-CM | POA: Insufficient documentation

## 2020-08-16 DIAGNOSIS — C049 Malignant neoplasm of floor of mouth, unspecified: Secondary | ICD-10-CM | POA: Diagnosis not present

## 2020-08-17 NOTE — Progress Notes (Signed)
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 At his initial consult with Dr. Ebbie Latus (ENT) on 05/03/2020 he stated that he was unable to move his tongue normally, and reported alteration in his sense of taste. He endorsed a history of unintentional weight loss, but this has been attributed to his pancreatitis and he states that recently, he has regained some weight back. She performed a biopsy to floor of mouth and a FNA to left neck mass in office. 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 PET scan on 06/02/2020 showed:  1. Hypermetabolic infiltrative left oral cavity and focal uptake in left level 1B and 2 cervical nodes are consistent with primary neoplasm and metastatic adenopathy.  2. No hypermetabolic distant metastatic disease evident.  3. Focal uptake in proximal pancreas with dilated duct for which primary neoplastic etiology cannot be completely excluded. Consider dedicated MRI pancreas for further characterization if not previously performed.  4. Subcentimeter right lower lobe pulmonary nodular opacity, adjacent to an area of scarring, is below resolution of PET. Attention on follow-up.  Patient was then referred to Dr. Danice Goltz and Dr. Fenton Malling (head and neck reconstructive surgeons) who worked patient up for extensive surgery performed on 06/29/2020  Biopsies revealed: 06/29/2020 A.  LYMPH NODE, RIGHT NECK CONTENTS, DISSECTION:  -Metastatic carcinoma in one of ten lymph nodes (1/10).  -Size of largest metastasis: 10 mm. -Extracapsular extension not identified. -Foreign body type giant cell reaction. -Benign salivary gland tissue. B. LYMPH NODE, LEFT  NECK CONTENTS, DISSECTION:  -Metastatic carcinoma in one of six lymph nodes (1/6).  -Size of largest  metastasis: less than 22 mm. -Extracapsular extension not identified. -Benign salivary gland tissue. C. TEETH, EXTRACTION:  -Teeth (gross examination only). D. "MANDIBULAR ALVEOLAR RIDGE", RIM MANDIBULECTOMY:  -Invasive squamous cell carcinoma, moderately differentiated, keratinizing type. -Ulcerated area with tumor measures 1.8 x 1.5 cm. -See part E for final tumor size. -Inked inferior soft tissue margin is 1 mm from the invasive carcinoma. -Inked posterior soft tissue margin is positive for invasive carcinoma. -See part E for final margin status. -Bone is free of carcinoma. E. "FLOOR OF MOUTH", PARTIAL GLOSSECTOMY:  -Invasive squamous cell carcinoma, moderately differentiated, keratinizing type. -Tumor size is 2.9 x 2.4 x 1.3 cm in this part, combined greatest dimension with part D is 4.7 cm. -Tumor thickness (depth of invasion) is 15 mm. -Tumor involves bilateral sublingual salivary glands. -Lymphovascular invasion present. -Perineural invasion present. -Resection margins are negative for carcinoma. -Posterior soft tissue margin is 2 mm from invasive carcinoma. -Final pathologic stage (AJCC 8th Ed.): pT4a pN2c  Nutrition Status Yes No Comments  Weight changes? []  [x]  Did have significant weight loss prior to surgery, but now reports his weight is staying steady   Swallowing concerns? [x]  []  Able to tolerate a soft food diet  PEG? []  [x]  Recently had NG tube removed   Referrals Yes No Comments  Social Work? [x]  []    Dentistry? [x]  []    Swallowing therapy? [x]  []    Nutrition? [x]  []    Med/Onc? []  [x]     Safety Issues Yes No Comments  Prior radiation? []  [x]    Pacemaker/ICD? []  [x]    Possible current pregnancy? []  [x]  N/A  Is the patient on methotrexate? []  [x]     Tobacco/Marijuana/Snuff/ETOH use: Former every day smoker (stopped in 06/2020). Denies any current alcohol  or recreational drug use  Past/Anticipated interventions by otolaryngology, if any:  08/24/2020 Dr.  Fenton Malling --Debridement of mandible   06/29/2020 Dr. Fenton Malling --[place] --RIM MANDIBULECTOMY --[place] (Bilateral Neck) --PLATYSMAL ROTATIONAL FLAP (Right Neck)   Past/Anticipated interventions by medical oncology, if any: No referral placed at this time   Current Complaints / other details:   Received first 3 Moderna vaccines

## 2020-08-18 ENCOUNTER — Ambulatory Visit
Admission: RE | Admit: 2020-08-18 | Discharge: 2020-08-18 | Disposition: A | Discharge status: Home / Self Care | Payer: Medicaid Other | Source: Ambulatory Visit | Attending: Radiation Oncology | Admitting: Radiation Oncology

## 2020-08-18 ENCOUNTER — Encounter: Payer: Self-pay | Admitting: Radiation Oncology

## 2020-08-18 ENCOUNTER — Other Ambulatory Visit: Payer: Self-pay

## 2020-08-18 VITALS — BP 98/77 | HR 76 | Temp 97.6°F | Resp 20 | Ht 70.0 in | Wt 131.0 lb

## 2020-08-18 DIAGNOSIS — R918 Other nonspecific abnormal finding of lung field: Secondary | ICD-10-CM | POA: Insufficient documentation

## 2020-08-18 DIAGNOSIS — C049 Malignant neoplasm of floor of mouth, unspecified: Secondary | ICD-10-CM | POA: Diagnosis not present

## 2020-08-18 DIAGNOSIS — C041 Malignant neoplasm of lateral floor of mouth: Secondary | ICD-10-CM | POA: Insufficient documentation

## 2020-08-18 DIAGNOSIS — Z87891 Personal history of nicotine dependence: Secondary | ICD-10-CM | POA: Insufficient documentation

## 2020-08-18 DIAGNOSIS — C048 Malignant neoplasm of overlapping sites of floor of mouth: Secondary | ICD-10-CM

## 2020-08-18 DIAGNOSIS — C779 Secondary and unspecified malignant neoplasm of lymph node, unspecified: Secondary | ICD-10-CM | POA: Diagnosis not present

## 2020-08-18 DIAGNOSIS — C770 Secondary and unspecified malignant neoplasm of lymph nodes of head, face and neck: Secondary | ICD-10-CM | POA: Diagnosis not present

## 2020-08-18 NOTE — Progress Notes (Signed)
Radiation Oncology         [phone] ________________________________  Initial Outpatient Consultation  Name: Tanner Thomas MRN: [id number]  Date: 08/18/2020  DOB: April 02, 1956  CC:Pcp, No  Lorayne Bender, MD   REFERRING PHYSICIAN: Lorayne Bender, MD  DIAGNOSIS: C04.8   ICD-10-CM   1. 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 male who presented with 41-month history of progressively worsening left-sided submandibular pain and swelling. He also reported difficulty moving his tongue and alternation in  taste.  Subsequently, the patient saw Dr. Ebbie Latus, ENT, who performed a biopsy of the floor of the mouth and an FNA of the left-sided neck mass. Pathology from the procedure revealed invasive squamous cell carcinoma, moderately differentiated.  Pertinent imaging thus far includes (I have personally reviewed his imaging): 1. Soft tissue neck CT scan on 05/05/2020 that showed progressive dilatation of the submandibular duct bilaterally without stone. There was also noted to be an enhancing mass in the floor of the mouth that appeared to be causing obstruction of both ducts. Findings were felt to most likely represent a carcinoma on the floor of the mouth likely from the submandibular gland on the left. Additionally, there were enlarging heterogeneously enhancing level 1 A lymph nodes bilaterally, left greater than right, compatible with metastatic nodes. Finally, there was an additional possible level 1 B metastatic node.  2.  PET scan on 06/02/2020 that showed hypermetabolic infiltrative left oral cavity and focal uptake in left level 1B and 2 cervical nodes, consistent with primary neoplasm and metastatic adenopathy. There was no hypermetabolic distant metastatic disease evidence. However, there was focal uptake in the proximal pancreas  with dilated duct for which primary neoplastic etiology could not be completed excluded. Finally, there was a sub-centimeter right lower lobe pulmonary nodular opacity, adjacent to an area of scarring, that was below PET resolution.  The patient underwent a rim mandibulectomy and partial glossectomy on 06/29/2020 that revealed invasive squamous cell carcinoma, moderately differentiated, keratinizing type. Final tumor size was 2.9 x 2.4 x 1.3 cm, with a combined greatest dimension of 4.7 cm. Tumor involved the bilateral sublingual salivary glands and there was also noted to be lymphovascular and perineural invasion present. Inked inferior soft tissue margin of mandibulectomy was 1 mm from the invasive carcinoma and positive for carcinoma at the posterior soft tissue margin. Final Resection margins were negative for carcinoma by 2nn. 1/10 right neck and 1/6 left neck lymph nodes were positive for metastatic carcinoma.  No extracapsular extension  Unfortunately he now has exposed bone at the tumor site and is undergoing corrective surgery for this with Dr. Vicie Mutters in about a week.  Radiation therapy will need to be delayed for this reason.    Nutrition Status Yes No Comments  Weight changes? []  [x]  Did have significant weight loss prior to surgery, but now reports his weight is staying steady   Swallowing concerns? [x]  []  Able to tolerate a soft food diet  PEG? []  [x]  Recently had NG tube removed  Referrals Yes No Comments  Social Work? [x]  []    Dentistry? [x]  []    Swallowing therapy? [x]  []    Nutrition? [x]  []    Med/Onc? []  [x]     Safety Issues Yes No Comments  Prior radiation? []  [x]     Pacemaker/ICD? []  [x]    Possible current pregnancy? []  [x]  N/A  Is the patient on methotrexate? []  [x]     Tobacco/Marijuana/Snuff/ETOH use: Former every day smoker (stopped in 06/2020). Denies any current alcohol or recreational drug use  Past/Anticipated interventions by otolaryngology, if any:  08/24/2020 Dr. Fenton Malling --Debridement of mandible   06/29/2020 Dr. Fenton Malling --[place] --RIM MANDIBULECTOMY --[place] (Bilateral Neck) --PLATYSMAL ROTATIONAL FLAP (Right Neck)   Past/Anticipated interventions by medical oncology, if any: No referral placed at this time  Current Complaints / other details:   Received first 3 Moderna vaccines  PREVIOUS RADIATION THERAPY: No  PAST MEDICAL HISTORY: Tobacco use disorder, sinus bradycardia, loss of weight, E. coli pyelonephritis, anemia, acute upper GI bleed, calculus of gallbladder without cholecystitis or obstruction, abnormal CT of the abdomen  PAST SURGICAL HISTORY: Past Surgical History:  Procedure Laterality Date  . BILIARY DILATION  10/30/2019   Procedure: BILIARY DILATION;  Surgeon: Milus Banister, MD;  Location: [facility];  Service: Endoscopy;;  . BILIARY DILATION  02/05/2020   Procedure: BILIARY DILATION;  Surgeon: Milus Banister, MD;  Location[facility];  Service: Endoscopy;;  . BILIARY STENT PLACEMENT N/A 10/30/2019   Procedure: BILIARY STENT PLACEMENT;  Surgeon: Milus Banister, MD;  [facility];  Service: Endoscopy;  Laterality: N/A;  . BILIARY STENT PLACEMENT N/A 02/05/2020   Procedure: BILIARY STENT PLACEMENT;  Surgeon: Milus Banister, MD;  [facility];  Service: Endoscopy;  Laterality: N/A;  . BIOPSY  03/06/2019   Procedure: BIOPSY;  Surgeon: Daneil Dolin, MD;  [facility];  Service: Endoscopy;;  gastric  . BIOPSY  02/05/2020   Procedure: BIOPSY;  Surgeon: Milus Banister, MD;  Location: [facility];  Service: Endoscopy;;  bile buct brushing  . CHOLECYSTECTOMY N/A 09/05/2019   Procedure: LAPAROSCOPIC CHOLECYSTECTOMY;  Surgeon: Aviva Signs, MD;  Location: [facility];  Service: General;  Laterality: N/A;  . COLONOSCOPY WITH PROPOFOL N/A 03/06/2019   Procedure: COLONOSCOPY WITH PROPOFOL;  Surgeon: Daneil Dolin, MD;  [facility];  Service: Endoscopy;  Laterality: N/A;  9:45am - ok at 10:00 per [facility]  . ENDOSCOPIC RETROGRADE CHOLANGIOPANCREATOGRAPHY (ERCP) WITH PROPOFOL N/A 10/30/2019   Procedure: ENDOSCOPIC RETROGRADE CHOLANGIOPANCREATOGRAPHY (ERCP) WITH PROPOFOL;  Surgeon: Milus Banister, MD;  [facility];  Service: Endoscopy;  Laterality: N/A;  . ENDOSCOPIC RETROGRADE CHOLANGIOPANCREATOGRAPHY (ERCP) WITH PROPOFOL N/A 02/05/2020   Procedure: ENDOSCOPIC RETROGRADE CHOLANGIOPANCREATOGRAPHY (ERCP) WITH PROPOFOL;  Surgeon: Milus Banister, MD;  [facility];  Service: Endoscopy;  Laterality: N/A;  . ESOPHAGOGASTRODUODENOSCOPY (EGD) WITH PROPOFOL N/A 03/06/2019   Procedure: ESOPHAGOGASTRODUODENOSCOPY (EGD) WITH PROPOFOL;  Surgeon: Daneil Dolin, MD;  [facility];  Service: Endoscopy;  Laterality: N/A;  . ESOPHAGOGASTRODUODENOSCOPY (EGD) WITH PROPOFOL N/A 08/21/2019   Procedure: ESOPHAGOGASTRODUODENOSCOPY (EGD) WITH PROPOFOL;  Surgeon: Milus Banister, MD;  [facility];  Service: Endoscopy;  Laterality: N/A;  . EUS N/A 08/21/2019   Procedure: UPPER ENDOSCOPIC ULTRASOUND (EUS) RADIAL;  Surgeon: Milus Banister, MD;  [facility];  Service: Endoscopy;  Laterality: N/A;  . PANCREAS SURGERY    . REMOVAL OF STONES  10/30/2019  Procedure: REMOVAL OF STONES;  Surgeon: Milus Banister, MD;  [facility];  Service: Endoscopy;;  . REMOVAL OF STONES  02/05/2020   Procedure: REMOVAL OF STONES;  Surgeon: Milus Banister, MD;  [facility];  Service: Endoscopy;;  . [name]  10/30/2019   Procedure:  [name];  Surgeon: Milus Banister, MD;  [facility];  Service: Endoscopy;;  . [name]  02/05/2020   Procedure: [name];  Surgeon: Milus Banister, MD;  [facility];  Service: Endoscopy;;  . Bess Kinds CHOLANGIOSCOPY N/A 02/05/2020   Procedure: KYHCWCBJ CHOLANGIOSCOPY;  Surgeon: Milus Banister, MD;  [facility];  Service: Endoscopy;  Laterality: N/A;  . STENT REMOVAL  02/05/2020   Procedure: STENT REMOVAL;  Surgeon: Milus Banister, MD;  [facility];  Service: Endoscopy;;    FAMILY HISTORY: family history includes Heart disease in his father.  SOCIAL HISTORY:  reports that he quit smoking about 2 months ago. His smoking use included cigarettes. He started smoking about 53 years ago. He smoked 0.00 packs per day for 50.00 years. He has never used smokeless tobacco. He reports previous alcohol use. He reports previous drug use. Drug: Marijuana.  ALLERGIES: Patient has no known allergies.  MEDICATIONS:  Current Outpatient Medications  Medication Sig Dispense Refill  . acetaminophen (TYLENOL) 500 MG tablet Take 1,000 mg by mouth every 6 (six) hours as needed for moderate pain.     No current facility-administered medications for this encounter.    REVIEW OF SYSTEMS:  Notable for that above.   PHYSICAL EXAM:  height is 5\' 10"  (1.778 m) and weight is 131 lb (59.4 kg). His temperature is 97.6 F (36.4 C). His blood pressure is 98/77 and his pulse is 76. His respiration is 20 and oxygen saturation is 100%.   General: Alert and oriented, in no acute distress HEENT: Head is normocephalic. Extraocular movements are intact. Oropharynx is notable for no lesions in upper throat.  Postoperative changes in anterior oral cavity with exposed mandible anteriorly.  Poor maxillary dentition are still present. Neck: Neck is notable for healing surgical scars from neck dissection, no palpable adenopathy.  He does have an area that has not  closed up completely in the right lower neck (gauze removed for exam), no sign of infection at this site Heart: Regular in rate and rhythm with no murmurs, rubs, or gallops. Chest: Clear to auscultation bilaterally, with no rhonchi, wheezes, or rales. Psychiatric: Judgment and insight are intact. Affect is appropriate.   ECOG = 2  0 - Asymptomatic (Fully active, able to carry on all predisease activities without restriction)  1 - Symptomatic but completely ambulatory (Restricted in physically strenuous activity but ambulatory and able to carry out work of a light or sedentary nature. For example, light housework, office work)  2 - Symptomatic, <50% in bed during the day (Ambulatory and capable of all self care but unable to carry out any work activities. Up and about more than 50% of waking hours)  3 - Symptomatic, >50% in bed, but not bedbound (Capable of only limited self-care, confined to bed or chair 50% or more of waking hours)  4 - Bedbound (Completely disabled. Cannot carry on any self-care. Totally confined to bed or chair)  5 - Death   Eustace Pen MM, Creech RH, Tormey DC, et al. [phone]). "Toxicity and response criteria of the [facility]". [place] Oncol. 5 (6): 649-55   LABORATORY DATA:  Lab Results  Component Value  Date   WBC 8.0 02/05/2020   HGB 13.1 02/06/2020   HCT 40.1 02/06/2020   MCV 94.4 02/05/2020   PLT 112 (L) 02/05/2020   CMP     Component Value Date/Time   NA 136 02/05/2020 1620   K 3.9 02/05/2020 1620   CL 104 02/05/2020 1620   CO2 22 02/05/2020 1620   GLUCOSE 128 (H) 02/05/2020 1620   BUN 13 02/05/2020 1620   CREATININE 0.84 02/05/2020 1620   CALCIUM 8.7 (L) 02/05/2020 1620   PROT 7.7 02/05/2020 1620   ALBUMIN 4.0 02/05/2020 1620   AST 25 02/05/2020 1620   ALT 13 02/05/2020 1620   ALKPHOS 62 02/05/2020 1620   BILITOT 0.8 02/05/2020 1620   GFRNONAA >60 02/05/2020 1620   GFRAA >60 09/03/2019 1459      Lab Results   Component Value Date   TSH 3.044 02/05/2020     RADIOGRAPHY: As above  IMPRESSION/PLAN:  This is a delightful patient with head and neck cancer. I recommend 6 weeks of adjuvant radiotherapy for this patient to the oral cavity and bilateral neck.  We discussed the potential risks, benefits, and side effects of radiotherapy. We talked in detail about acute and late effects. We discussed that some of the most bothersome acute effects may be mucositis, dysgeusia, salivary changes, skin irritation, hair loss, dehydration, weight loss and fatigue. We talked about late effects which include but are not necessarily limited to dysphagia, hypothyroidism, nerve injury, vascular injury, spinal cord injury, xerostomia, trismus, neck edema, and potential injury to any of the tissues in the head and neck region. No guarantees of treatment were given. A consent form was signed and placed in the patient's medical record. The patient is enthusiastic about proceeding with treatment. I look forward to participating in the patient's care.    Simulation (treatment planning) will take place after he completes debridement by otolaryngology.  He also needs to see dentistry as he has poor dentition, though I am not sure it will be realistic for him to undergo extractions as radiation is fairly urgent.  He missed his appointment with Dr. Benson Norway due to another appointment.  We will try to reschedule.  We also discussed that the treatment of head and neck cancer is a multidisciplinary process to maximize treatment outcomes and quality of life. For this reason the following referrals have been or will be made:    Dentistry for dental evaluation, possible extractions in the radiation fields, and /or advice on reducing risk of cavities, osteoradionecrosis, or other oral issues. See above.   Nutritionist for nutrition support during and after treatment.   Speech language pathology for swallowing and/or speech therapy.    Social work for social support.    Physical therapy due to risk of lymphedema in neck and deconditioning.     On date of service, in total, I spent 60 minutes on this encounter. Patient was seen in person.  __________________________________________   Eppie Gibson, MD  This document serves as a record of services personally performed by Eppie Gibson, MD. It was created on his behalf by Clerance Lav, a trained medical scribe. The creation of this record is based on the scribe's personal observations and the provider's statements to them. This document has been checked and approved by the attending provider.

## 2020-08-19 NOTE — Progress Notes (Signed)
Oncology Nurse Navigator Documentation  Met with patient during initial consult with Dr. Isidore Moos. He was accompanied by his sister Rodena Piety.  . Further introduced myself as his/their Navigator, explained my role as a member of the Care Team. . Provided New Patient Information packet: o Contact information for physician, this navigator, other members of the Care Team o Advance Directive information ([place] blue pamphlet with LCSW insert); provided [facility] AD booklet at his request,  o Fall Prevention Patient [place] sheet o Symptom Management [facility] information o [facility] campus map with highlight of [place] o SLP Information sheet . Assisted with post-consult appt scheduling. . Discussed the location of Dr. Raynelle Dick office and [facility] as reference for future appts, including arrival procedure for these appts.   . They verbalized understanding of information provided. . I encouraged them to call with questions/concerns moving forward.  Harlow Asa, RN, BSN, OCN Head & Neck Oncology Nurse [place] at [place] [phone]

## 2020-08-23 ENCOUNTER — Encounter: Payer: Self-pay | Admitting: Radiation Oncology

## 2020-08-23 DIAGNOSIS — C048 Malignant neoplasm of overlapping sites of floor of mouth: Secondary | ICD-10-CM | POA: Insufficient documentation

## 2020-08-23 NOTE — Progress Notes (Signed)
Dental Form with Estimates of Radiation Dose      Diagnosis:    ICD-10-CM   1. 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  These may be grazed by 50-60 [employer] without a bite block.  Radiation therapy has already been delayed due to healing issues.  If extractions can be expedited please let me know; otherwise we will start radiation as soon as he is released by otolaryngology (mandibular bone is exposed postoperatively and he is undergoing debridement this week)   Feel free to message or call me to discuss.  Simulation: ASAP    Other Notes:  Please contact Eppie Gibson, MD, with patient's disposition after evaluation and/or dental treatment.

## 2020-08-24 DIAGNOSIS — M9689 Other intraoperative and postprocedural complications and disorders of the musculoskeletal system: Secondary | ICD-10-CM | POA: Diagnosis not present

## 2020-08-24 DIAGNOSIS — C049 Malignant neoplasm of floor of mouth, unspecified: Secondary | ICD-10-CM | POA: Diagnosis not present

## 2020-08-26 NOTE — Progress Notes (Signed)
Oncology Nurse Navigator Documentation  At Dr. Pearlie Oyster request I have called Dr. Reine Just office at [facility] to inquire about Tanner Thomas' status after his mandible debridement on 5/3. I left a voice mail with their nurse triage line with information requested and my direct contact information. Dr. Isidore Moos would like to have Tanner Thomas evaluated by a dentist here in [place] as soon as possible to prepare for his upcoming radiation treatment.   Harlow Asa RN, BSN, OCN Head & Neck Oncology Nurse [place] at [facility] Phone # [phone]  Fax # [phone]

## 2020-08-30 ENCOUNTER — Other Ambulatory Visit: Payer: Self-pay

## 2020-08-30 ENCOUNTER — Telehealth: Payer: Self-pay | Admitting: Radiation Oncology

## 2020-08-30 DIAGNOSIS — C048 Malignant neoplasm of overlapping sites of floor of mouth: Secondary | ICD-10-CM

## 2020-08-30 NOTE — Telephone Encounter (Signed)
Scheduled appt per 5/9 sch msg. Pt aware.  

## 2020-09-01 ENCOUNTER — Inpatient Hospital Stay: Payer: Medicaid Other | Attending: Dietician | Admitting: Dietician

## 2020-09-01 NOTE — Progress Notes (Signed)
Nutrition   Patient did not show for scheduled nutrition appointment.   Lajuan Lines, RD, [place] Cell [phone]

## 2020-09-02 ENCOUNTER — Telehealth: Payer: Self-pay | Admitting: General Practice

## 2020-09-02 NOTE — Telephone Encounter (Signed)
[place] CSW Progress Notes  Call from sister Rodena Piety, says she will not be able to transport patient to upcoming appointments.  As he is enrolled in [employer], CSW left VM w number to call to schedule rides as needed.  Also asked Transportation to try to reach out to sister.  Edwyna Shell, LCSW Clinical Social Worker Phone:  [phone]

## 2020-09-03 ENCOUNTER — Ambulatory Visit
Admission: RE | Admit: 2020-09-03 | Discharge: 2020-09-03 | Disposition: A | Discharge status: Home / Self Care | Payer: Medicaid Other | Source: Ambulatory Visit | Attending: Radiation Oncology | Admitting: Radiation Oncology

## 2020-09-03 ENCOUNTER — Other Ambulatory Visit: Payer: Self-pay

## 2020-09-03 DIAGNOSIS — C770 Secondary and unspecified malignant neoplasm of lymph nodes of head, face and neck: Secondary | ICD-10-CM | POA: Diagnosis not present

## 2020-09-03 DIAGNOSIS — C049 Malignant neoplasm of floor of mouth, unspecified: Secondary | ICD-10-CM | POA: Insufficient documentation

## 2020-09-03 DIAGNOSIS — C048 Malignant neoplasm of overlapping sites of floor of mouth: Secondary | ICD-10-CM | POA: Diagnosis present

## 2020-09-03 DIAGNOSIS — Z510 Encounter for antineoplastic radiation therapy: Secondary | ICD-10-CM | POA: Diagnosis not present

## 2020-09-03 DIAGNOSIS — C041 Malignant neoplasm of lateral floor of mouth: Secondary | ICD-10-CM | POA: Diagnosis not present

## 2020-09-03 DIAGNOSIS — Z87891 Personal history of nicotine dependence: Secondary | ICD-10-CM | POA: Diagnosis not present

## 2020-09-07 ENCOUNTER — Ambulatory Visit: Payer: Medicaid Other | Admitting: Radiation Oncology

## 2020-09-08 ENCOUNTER — Ambulatory Visit: Payer: Medicaid Other

## 2020-09-08 DIAGNOSIS — Z510 Encounter for antineoplastic radiation therapy: Secondary | ICD-10-CM | POA: Diagnosis not present

## 2020-09-08 DIAGNOSIS — Z87891 Personal history of nicotine dependence: Secondary | ICD-10-CM | POA: Diagnosis not present

## 2020-09-08 DIAGNOSIS — C770 Secondary and unspecified malignant neoplasm of lymph nodes of head, face and neck: Secondary | ICD-10-CM | POA: Diagnosis not present

## 2020-09-08 DIAGNOSIS — C041 Malignant neoplasm of lateral floor of mouth: Secondary | ICD-10-CM | POA: Diagnosis not present

## 2020-09-09 ENCOUNTER — Ambulatory Visit: Payer: Medicaid Other

## 2020-09-09 ENCOUNTER — Ambulatory Visit
Admission: RE | Admit: 2020-09-09 | Discharge: 2020-09-09 | Disposition: A | Discharge status: Home / Self Care | Payer: Medicaid Other | Source: Ambulatory Visit | Attending: Radiation Oncology | Admitting: Radiation Oncology

## 2020-09-09 ENCOUNTER — Other Ambulatory Visit: Payer: Self-pay

## 2020-09-09 DIAGNOSIS — C770 Secondary and unspecified malignant neoplasm of lymph nodes of head, face and neck: Secondary | ICD-10-CM | POA: Diagnosis not present

## 2020-09-09 DIAGNOSIS — Z510 Encounter for antineoplastic radiation therapy: Secondary | ICD-10-CM | POA: Diagnosis not present

## 2020-09-09 DIAGNOSIS — Z87891 Personal history of nicotine dependence: Secondary | ICD-10-CM | POA: Diagnosis not present

## 2020-09-09 DIAGNOSIS — C041 Malignant neoplasm of lateral floor of mouth: Secondary | ICD-10-CM | POA: Diagnosis not present

## 2020-09-10 ENCOUNTER — Ambulatory Visit
Admission: RE | Admit: 2020-09-10 | Discharge: 2020-09-10 | Disposition: A | Discharge status: Home / Self Care | Payer: Medicaid Other | Source: Ambulatory Visit | Attending: Radiation Oncology | Admitting: Radiation Oncology

## 2020-09-10 DIAGNOSIS — C770 Secondary and unspecified malignant neoplasm of lymph nodes of head, face and neck: Secondary | ICD-10-CM | POA: Diagnosis not present

## 2020-09-10 DIAGNOSIS — C041 Malignant neoplasm of lateral floor of mouth: Secondary | ICD-10-CM | POA: Diagnosis not present

## 2020-09-10 DIAGNOSIS — Z510 Encounter for antineoplastic radiation therapy: Secondary | ICD-10-CM | POA: Diagnosis not present

## 2020-09-10 DIAGNOSIS — Z87891 Personal history of nicotine dependence: Secondary | ICD-10-CM | POA: Diagnosis not present

## 2020-09-13 ENCOUNTER — Other Ambulatory Visit: Payer: Self-pay

## 2020-09-13 ENCOUNTER — Ambulatory Visit
Admission: RE | Admit: 2020-09-13 | Discharge: 2020-09-13 | Disposition: A | Discharge status: Home / Self Care | Payer: Medicaid Other | Source: Ambulatory Visit | Attending: Radiation Oncology | Admitting: Radiation Oncology

## 2020-09-13 DIAGNOSIS — C048 Malignant neoplasm of overlapping sites of floor of mouth: Secondary | ICD-10-CM

## 2020-09-13 DIAGNOSIS — Z510 Encounter for antineoplastic radiation therapy: Secondary | ICD-10-CM | POA: Diagnosis not present

## 2020-09-13 DIAGNOSIS — C041 Malignant neoplasm of lateral floor of mouth: Secondary | ICD-10-CM | POA: Diagnosis not present

## 2020-09-13 DIAGNOSIS — C770 Secondary and unspecified malignant neoplasm of lymph nodes of head, face and neck: Secondary | ICD-10-CM | POA: Diagnosis not present

## 2020-09-13 DIAGNOSIS — Z87891 Personal history of nicotine dependence: Secondary | ICD-10-CM | POA: Diagnosis not present

## 2020-09-13 MED ORDER — SONAFINE EX EMUL
1.0000 "application " | Freq: Two times a day (BID) | CUTANEOUS | Status: DC
  Administered 2020-09-13: 1 via TOPICAL

## 2020-09-13 NOTE — Progress Notes (Signed)
Pt here for patient teaching.  Completed by Ky Barban  Pt given Radiation and You booklet, Managing Acute Radiation Side Effects for Head and Neck Cancer handout, skin care instructions and Sonafine.    Reviewed areas of pertinence such as fatigue, hair loss, mouth changes, skin changes, throat changes, earaches and taste changes .   Pt able to give teach back of to pat skin, use unscented/gentle soap and drink plenty of water,apply Sonafine bid, avoid applying anything to skin within 4 hours of treatment and to use an electric razor if they must shave.   Pt demonstrated understanding and verbalizes understanding of information given and will contact nursing with any questions or concerns.    Http://rtanswers.org/treatmentinformation/whattoexpect/index

## 2020-09-14 ENCOUNTER — Ambulatory Visit
Admission: RE | Admit: 2020-09-14 | Discharge: 2020-09-14 | Disposition: A | Discharge status: Home / Self Care | Payer: Medicaid Other | Source: Ambulatory Visit | Attending: Radiation Oncology | Admitting: Radiation Oncology

## 2020-09-14 DIAGNOSIS — Z510 Encounter for antineoplastic radiation therapy: Secondary | ICD-10-CM | POA: Diagnosis not present

## 2020-09-14 DIAGNOSIS — C041 Malignant neoplasm of lateral floor of mouth: Secondary | ICD-10-CM | POA: Diagnosis not present

## 2020-09-14 DIAGNOSIS — Z87891 Personal history of nicotine dependence: Secondary | ICD-10-CM | POA: Diagnosis not present

## 2020-09-14 DIAGNOSIS — C770 Secondary and unspecified malignant neoplasm of lymph nodes of head, face and neck: Secondary | ICD-10-CM | POA: Diagnosis not present

## 2020-09-15 ENCOUNTER — Other Ambulatory Visit: Payer: Self-pay

## 2020-09-15 ENCOUNTER — Ambulatory Visit
Admission: RE | Admit: 2020-09-15 | Discharge: 2020-09-15 | Disposition: A | Discharge status: Home / Self Care | Payer: Medicaid Other | Source: Ambulatory Visit | Attending: Radiation Oncology | Admitting: Radiation Oncology

## 2020-09-15 DIAGNOSIS — C041 Malignant neoplasm of lateral floor of mouth: Secondary | ICD-10-CM | POA: Diagnosis not present

## 2020-09-15 DIAGNOSIS — C770 Secondary and unspecified malignant neoplasm of lymph nodes of head, face and neck: Secondary | ICD-10-CM | POA: Diagnosis not present

## 2020-09-15 DIAGNOSIS — Z510 Encounter for antineoplastic radiation therapy: Secondary | ICD-10-CM | POA: Diagnosis not present

## 2020-09-15 DIAGNOSIS — Z87891 Personal history of nicotine dependence: Secondary | ICD-10-CM | POA: Diagnosis not present

## 2020-09-16 ENCOUNTER — Ambulatory Visit
Admission: RE | Admit: 2020-09-16 | Discharge: 2020-09-16 | Disposition: A | Discharge status: Home / Self Care | Payer: Medicaid Other | Source: Ambulatory Visit | Attending: Radiation Oncology | Admitting: Radiation Oncology

## 2020-09-16 ENCOUNTER — Other Ambulatory Visit: Payer: Self-pay

## 2020-09-16 DIAGNOSIS — C770 Secondary and unspecified malignant neoplasm of lymph nodes of head, face and neck: Secondary | ICD-10-CM | POA: Diagnosis not present

## 2020-09-16 DIAGNOSIS — Z510 Encounter for antineoplastic radiation therapy: Secondary | ICD-10-CM | POA: Diagnosis not present

## 2020-09-16 DIAGNOSIS — Z87891 Personal history of nicotine dependence: Secondary | ICD-10-CM | POA: Diagnosis not present

## 2020-09-16 DIAGNOSIS — C041 Malignant neoplasm of lateral floor of mouth: Secondary | ICD-10-CM | POA: Diagnosis not present

## 2020-09-17 ENCOUNTER — Ambulatory Visit
Admission: RE | Admit: 2020-09-17 | Discharge: 2020-09-17 | Disposition: A | Discharge status: Home / Self Care | Payer: Medicaid Other | Source: Ambulatory Visit | Attending: Radiation Oncology | Admitting: Radiation Oncology

## 2020-09-17 ENCOUNTER — Other Ambulatory Visit: Payer: Self-pay

## 2020-09-17 DIAGNOSIS — C770 Secondary and unspecified malignant neoplasm of lymph nodes of head, face and neck: Secondary | ICD-10-CM | POA: Diagnosis not present

## 2020-09-17 DIAGNOSIS — Z510 Encounter for antineoplastic radiation therapy: Secondary | ICD-10-CM | POA: Diagnosis not present

## 2020-09-17 DIAGNOSIS — C041 Malignant neoplasm of lateral floor of mouth: Secondary | ICD-10-CM | POA: Diagnosis not present

## 2020-09-17 DIAGNOSIS — Z87891 Personal history of nicotine dependence: Secondary | ICD-10-CM | POA: Diagnosis not present

## 2020-09-21 ENCOUNTER — Other Ambulatory Visit: Payer: Self-pay | Admitting: Radiation Oncology

## 2020-09-21 ENCOUNTER — Ambulatory Visit: Payer: Medicaid Other | Admitting: Radiation Oncology

## 2020-09-21 ENCOUNTER — Ambulatory Visit
Admission: RE | Admit: 2020-09-21 | Discharge: 2020-09-21 | Disposition: A | Discharge status: Home / Self Care | Payer: Medicaid Other | Source: Ambulatory Visit | Attending: Radiation Oncology | Admitting: Radiation Oncology

## 2020-09-21 DIAGNOSIS — C770 Secondary and unspecified malignant neoplasm of lymph nodes of head, face and neck: Secondary | ICD-10-CM | POA: Diagnosis not present

## 2020-09-21 DIAGNOSIS — Z87891 Personal history of nicotine dependence: Secondary | ICD-10-CM | POA: Diagnosis not present

## 2020-09-21 DIAGNOSIS — C041 Malignant neoplasm of lateral floor of mouth: Secondary | ICD-10-CM | POA: Diagnosis not present

## 2020-09-21 DIAGNOSIS — C049 Malignant neoplasm of floor of mouth, unspecified: Secondary | ICD-10-CM

## 2020-09-21 DIAGNOSIS — Z510 Encounter for antineoplastic radiation therapy: Secondary | ICD-10-CM | POA: Diagnosis not present

## 2020-09-21 MED ORDER — LIDOCAINE VISCOUS HCL 2 % MT SOLN: OROMUCOSAL | 3 refills | Status: DC

## 2020-09-22 ENCOUNTER — Other Ambulatory Visit: Payer: Self-pay

## 2020-09-22 ENCOUNTER — Ambulatory Visit
Admission: RE | Admit: 2020-09-22 | Discharge: 2020-09-22 | Disposition: A | Discharge status: Home / Self Care | Payer: Medicaid Other | Source: Ambulatory Visit | Attending: Radiation Oncology | Admitting: Radiation Oncology

## 2020-09-22 ENCOUNTER — Ambulatory Visit: Payer: Medicaid Other

## 2020-09-22 DIAGNOSIS — C049 Malignant neoplasm of floor of mouth, unspecified: Secondary | ICD-10-CM | POA: Insufficient documentation

## 2020-09-22 DIAGNOSIS — Z87891 Personal history of nicotine dependence: Secondary | ICD-10-CM | POA: Diagnosis not present

## 2020-09-22 DIAGNOSIS — C770 Secondary and unspecified malignant neoplasm of lymph nodes of head, face and neck: Secondary | ICD-10-CM | POA: Diagnosis not present

## 2020-09-22 DIAGNOSIS — C041 Malignant neoplasm of lateral floor of mouth: Secondary | ICD-10-CM | POA: Diagnosis not present

## 2020-09-23 ENCOUNTER — Ambulatory Visit
Admission: RE | Admit: 2020-09-23 | Discharge: 2020-09-23 | Disposition: A | Discharge status: Home / Self Care | Payer: Medicaid Other | Source: Ambulatory Visit | Attending: Radiation Oncology | Admitting: Radiation Oncology

## 2020-09-23 ENCOUNTER — Ambulatory Visit: Payer: Medicaid Other | Admitting: Physical Therapy

## 2020-09-23 ENCOUNTER — Ambulatory Visit: Payer: Medicaid Other

## 2020-09-23 ENCOUNTER — Encounter: Payer: Self-pay | Admitting: Physical Therapy

## 2020-09-23 DIAGNOSIS — R1312 Dysphagia, oropharyngeal phase: Secondary | ICD-10-CM | POA: Insufficient documentation

## 2020-09-23 DIAGNOSIS — R471 Dysarthria and anarthria: Secondary | ICD-10-CM | POA: Insufficient documentation

## 2020-09-23 DIAGNOSIS — R293 Abnormal posture: Secondary | ICD-10-CM

## 2020-09-23 DIAGNOSIS — C048 Malignant neoplasm of overlapping sites of floor of mouth: Secondary | ICD-10-CM | POA: Insufficient documentation

## 2020-09-23 DIAGNOSIS — Z87891 Personal history of nicotine dependence: Secondary | ICD-10-CM | POA: Diagnosis not present

## 2020-09-23 DIAGNOSIS — C770 Secondary and unspecified malignant neoplasm of lymph nodes of head, face and neck: Secondary | ICD-10-CM | POA: Diagnosis not present

## 2020-09-23 DIAGNOSIS — C049 Malignant neoplasm of floor of mouth, unspecified: Secondary | ICD-10-CM | POA: Diagnosis not present

## 2020-09-23 DIAGNOSIS — C041 Malignant neoplasm of lateral floor of mouth: Secondary | ICD-10-CM | POA: Diagnosis not present

## 2020-09-23 NOTE — Patient Instructions (Signed)
SWALLOWING EXERCISES Do these 6-7 days a week until 6 months after your last day of radiation, then 2 times per week afterwards  1. Effortful Swallows - Press your tongue against the roof of your mouth for 3 seconds, then squeeze the muscles in your neck while you swallow your saliva or a sip of water - Repeat 10-15 times, 2-3 times a day, and use whenever you eat or drink  2. Masako Swallow - swallow with your tongue touching the back of your lips - Stick tongue on the back of your closed lips  - Swallow, while holding your tongue in that position - Repeat 10-15 times, 2-3 times a day *use a wet spoon if your mouth gets dry*  3. Shaker Exercise - head lift - Lie flat on your back in your bed or on a couch without pillows - Raise your head and look at your feet - KEEP YOUR SHOULDERS DOWN - HOLD FOR 45-60 SECONDS, then lower your head back down - Repeat 3 times, 2-3 times a day  4. Mendelsohn Maneuver - "half swallow" exercise - Start to swallow, and keep your Adam's apple up by squeezing hard with the muscles of the throat - Hold the squeeze for 5-7 seconds and then relax - Repeat 10-15 times, 2-3 times a day *use a wet spoon if your mouth gets dry*  5. Tongue Stretch/Teeth Clean - Move your tongue around the pocket between your gums and teeth, clockwise and then counter-clockwise - Repeat on the back side, clockwise and then counter-clockwise - Repeat 15-20 times, 2-3 times a day       6.   "Super Swallow"  - Take a breath and hold it  - Bear down (like pushing your bowels)  - Swallow then IMMEDIATELY cough  - Repeat 10 times, 2-3 times a day

## 2020-09-23 NOTE — Therapy (Addendum)
[facility] [address] [place], [place], [address] Phone: [phone]   Fax:  [phone]  Speech Language Pathology Evaluation  Patient Details  Name: Tanner Thomas MRN: [id number] Date of Birth: [birth year] Referring Provider (SLP): Eppie Gibson, MD   Encounter Date: 09/23/2020   End of Session - 09/23/20 1123    Visit Number 1    Number of Visits 4    Date for SLP Re-Evaluation 12/22/20    SLP Start Time 1010    SLP Stop Time  1050    SLP Time Calculation (min) 40 min    Activity Tolerance Patient tolerated treatment well           Past Medical History:  Diagnosis Date  . Known health problems: none     Past Surgical History:  Procedure Laterality Date  . BILIARY DILATION  10/30/2019   Procedure: BILIARY DILATION;  Surgeon: Milus Banister, MD;  Location: [facility];  Service: Endoscopy;;  . BILIARY DILATION  02/05/2020   Procedure: BILIARY DILATION;  Surgeon: Milus Banister, MD;  Location: [facility];  Service: Endoscopy;;  . BILIARY STENT PLACEMENT N/A 10/30/2019   Procedure: BILIARY STENT PLACEMENT;  Surgeon: Milus Banister, MD;  Location: [facility];  Service: Endoscopy;  Laterality: N/A;  . BILIARY STENT PLACEMENT N/A 02/05/2020   Procedure: BILIARY STENT PLACEMENT;  Surgeon: Milus Banister, MD;  Location: [facility];  Service: Endoscopy;  Laterality: N/A;  . BIOPSY  03/06/2019   Procedure: BIOPSY;  Surgeon: Daneil Dolin, MD;  [facility];  Service: Endoscopy;;  gastric  . BIOPSY  02/05/2020   Procedure: BIOPSY;  Surgeon: Milus Banister, MD;  Location: [facility];  Service: Endoscopy;;  bile buct brushing  . CHOLECYSTECTOMY N/A 09/05/2019   Procedure: LAPAROSCOPIC CHOLECYSTECTOMY;  Surgeon: Aviva Signs, MD;  Location: [facility];  Service: General;  Laterality: N/A;  . COLONOSCOPY WITH PROPOFOL N/A 03/06/2019   Procedure: COLONOSCOPY WITH PROPOFOL;  Surgeon: Daneil Dolin, MD;  [facility];  Service: Endoscopy;  Laterality: N/A;  9:45am - ok at 10:00 per [facility]  . ENDOSCOPIC RETROGRADE CHOLANGIOPANCREATOGRAPHY (ERCP) WITH PROPOFOL N/A 10/30/2019   Procedure: ENDOSCOPIC RETROGRADE CHOLANGIOPANCREATOGRAPHY (ERCP) WITH PROPOFOL;  Surgeon: Milus Banister, MD;  [facility];  Service: Endoscopy;  Laterality: N/A;  . ENDOSCOPIC RETROGRADE CHOLANGIOPANCREATOGRAPHY (ERCP) WITH PROPOFOL N/A 02/05/2020   Procedure: ENDOSCOPIC RETROGRADE CHOLANGIOPANCREATOGRAPHY (ERCP) WITH PROPOFOL;  Surgeon: Milus Banister, MD;  Location: [facility];  Service: Endoscopy;  Laterality: N/A;  . ESOPHAGOGASTRODUODENOSCOPY (EGD) WITH PROPOFOL N/A 03/06/2019   Procedure: ESOPHAGOGASTRODUODENOSCOPY (EGD) WITH PROPOFOL;  Surgeon: Daneil Dolin, MD;  [facility];  Service: Endoscopy;  Laterality: N/A;  . ESOPHAGOGASTRODUODENOSCOPY (EGD) WITH PROPOFOL N/A 08/21/2019   Procedure: ESOPHAGOGASTRODUODENOSCOPY (EGD) WITH PROPOFOL;  Surgeon: Milus Banister, MD;  [facility];  Service: Endoscopy;  Laterality: N/A;  . EUS N/A 08/21/2019   Procedure: UPPER ENDOSCOPIC ULTRASOUND (EUS) RADIAL;  Surgeon: Milus Banister, MD;  Location: [facility];  Service: Endoscopy;  Laterality: N/A;  . PANCREAS SURGERY    . REMOVAL OF STONES  10/30/2019   Procedure: REMOVAL OF STONES;  Surgeon: Milus Banister, MD;  Location: [facility];  Service: Endoscopy;;  . REMOVAL OF STONES  02/05/2020   Procedure: REMOVAL OF STONES;  Surgeon: Milus Banister, MD;  Location: [facility];  Service: Endoscopy;;  . [name]  10/30/2019   Procedure: [name];  Surgeon: Owens Loffler  P, MD;  Location: [facility];  Service: Endoscopy;;  . SPHINCTEROTOMY  02/05/2020   Procedure: SPHINCTEROTOMY;  Surgeon: Milus Banister, MD;  Location: [facility];  Service: Endoscopy;;  . Bess Kinds CHOLANGIOSCOPY N/A 02/05/2020   Procedure: DTOIZTIW CHOLANGIOSCOPY;  Surgeon: Milus Banister, MD;  Location: [facility];  Service: Endoscopy;  Laterality: N/A;  . STENT REMOVAL  02/05/2020   Procedure: STENT REMOVAL;  Surgeon: Milus Banister, MD;  Location: [facility];  Service: Endoscopy;;    There were no vitals filed for this visit.   Subjective Assessment - 09/23/20 1020    Subjective Pt had FEES 07-13-20 and it was suggested pt hold all POs. 08-11-20 note from Dr. Nevada Crane (ENT) stated pt needed f/u SLP swallow eval. None showing currently in Care Everywhere. Pt denies overt s/sx of aspiration PNA in the last 4 weeks.    Currently in Pain? 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 Tanner Thomas mentioned he eats minced chicken and rice, ramen noodles, mashed pinto beans as well as the minced pork chop with gravy. He indicates he does NOT chew at this time, just swallows his food. Denies any difficulty with pharyngeal clearance or other s/sx of pharyngeal difficulty. He does state that it is necessary to occasionally provide digital manipulation of left-sided oral reside with POs that include meat.      General Information   HPI Jan 2022 biopsy of the floor of mouth and FNA of the left-sided neck mass. Pathology revealed invasive squamous cell carcinoma, moderately differentiated. On 06/02/20 PET completed revealing hypermetabolic infiltrative left oral cavity and focal uptake in left level 1B and 2 cervical nodes, consistent with primary neoplasm and metastatic adenopathy. 06/29/20 patient underwent a rim mandibulectomy and partial glossectomy by Dr. Vicie Mutters [facility]) that revealed invasive squamous cell carcinoma,  moderately differentiated, keratinizing type. Final tumor size was 2.9 x 2.4 x 1.3 cm, with a combined greatest dimension of 4.7 cm. Tumor involved the bilateral sublingual salivary glands and there was also noted to be lymphovascular and perineural invasion present. Inked inferior soft tissue margin of mandibulectomy was 1 mm from the invasive carcinoma and positive for carcinoma at the posterior soft tissue margin. Final Resection margins were negative for carcinoma by 2nn. 1/10 right neck and 1/6 left neck lymph nodes were positive for metastatic carcinoma.  No extracapsular extension. He also underwent dental extractions during the 06/27/20 procedure. He is getting 30 fx rad tx to floor of mouth and bil neck, started 09-09-20 and ending 10-21-20.     Prior Functional Status   Cognitive/Linguistic Baseline Within functional limits      Cognition   Overall Cognitive Status Within Functional Limits for tasks assessed      Auditory Comprehension   Overall Auditory Comprehension Appears within functional limits for tasks assessed      Verbal Expression   Overall Verbal Expression Appears within functional limits for tasks assessed      Oral Motor/Sensory Function   Overall Oral Motor/Sensory Function Impaired  Labial ROM Within Functional Limits    Labial Symmetry Within Functional Limits    Labial Strength Within Functional Limits    Lingual ROM Reduced right;Reduced left   no lateral movement on lt, severely limited on rt   Lingual Symmetry Other (Comment)   surgical changes to lingual structure noted   Lingual Strength Reduced    Lingual Coordination Reduced      Motor Speech   Overall Motor Speech Impaired    Articulation Impaired    Level of Impairment Phrase    Intelligibility Intelligibility reduced    Sentence --   approx 90% with this trained listener - when SLP asked pt to repeat he exaggerated articulation to improve his intelligibilty - pt 95-100% intelligible with this strategy    Conversation --   see "sentence", above         Pt rec'd a FEES at [facility] with SLP Alvester Chou on 07-13-20. Results were described as:  "Moderate oropharyngeal dysphagia secondary to head and neck cancer and recent surgical resection. Oral stage is marked by reduced lingual movement resulting in reduced oral control of liquids causing posterior escape/pre swallow penetration and slowed A/P transit with puree. Pharyngeal phase is characterized by decreased bolus propulsion, likely decreased hyolaryngeal excursion, incomplete airway closure, and diminished laryngeal/pharyngeal sensation. Deficits result in moderate residue across consistencies, penetration and aspiration of thins, and intermittent transient penetration of nectar. At this time recommend that patient initiate limited amounts of puree and nectar liquids with adherence to aspiration precautions below--pending clearance from physician relative to healing. Patient was educated on risks and adverse outcomes associated with dysphagia and aspiration, and rationale for adherence to management recommendations. He will need to maintain the full rate of feeding, hydration, and all medication via NG tube. Provided written information on recommended diet, purchasing/mixing thickener, and aspiration precautions. Patient and caregiver verbalized understanding of all exam results and recommendations. Exam results reviewed with ENT Dr. Nevada Crane following the study. Tentatively plan to follow up at time of next physician visit. Note: Spoke with Dr. Nevada Crane following his appointment, and he reports there is concern relative to flap healing. He has recommend patient withhold from starting PO for the time being. Dr. Nevada Crane will reassess next week. Defer FEES follow up pending physician clearance. Prognosis: Good with continued recovery. Recommendations: Diet: puree, nectar thick liquids (continue full rate of feeding, hydration, and medication via NG tube) Aspiration  Precautions: sit upright during all PO (as close to 90 degrees as possible), alternate liquids and puree/grounds/solids, cough/clear throat regularly during meal Administer Medications: via alternate means Other recommendations: good oral care"   Overall today, pt's swallow was functional for pureed/thin, and based on today's findings SLP postulates pt would also be functional for dys II solids/minced with sauces/gravies, maintaining small bites and liquid wash as pt spontaneously did today. Pt ate 4 bites applesauce, and then three bites of minced thinly-sliced ham with mayo (to simulate minced meat with gravy), with sequential multiple swallows thin liquids and single sips tihn liquid. Pt with effortful swalow noted with minced ham and mayo, and a spontaneous liquid wash indicating possibility of decr'd pharyngeal transit. He stated "the water helps it go down better." No overt s/s aspiration with these items today. SLP contacted pt's SLP at [facility] who performed FEES 07-13-20 with these results, and at this time pt will be referred back to [facility] for another instrumental study PRN, or one could be completed in [place] if necessary.  Because data states the risk for dysphagia  during and after radiation treatment is high due to undergoing radiation tx, SLP taught pt about the possibility of reduced/limited ability for PO intake during rad tx. SLP encouraged pt to continue swallowing POs as far into rad tx as possible. Among other modifications for days when pt cannot functionally swallow, SLP talked about cycling through the program of exercises so that all can be completed instead of fatiguing on one of the swallowing exercises and not being able to perform the others. Pt was then told to add all reps of swallowing tasks back in when it becomes possible to do so. SLP educated pt re: changes to swallowing musculature after rad tx, and why adherence to dysphagia HEP provided today and PO consumption was  necessary to reduce muscle fibrosis following rad tx. Pt demonstrated understanding of each of these things to SLP.    SLP then developed a HEP for pt and pt was instructed how to perform exercises involving lingual, vocal, and pharyngeal strengthening. SLP performed each exercise and pt return demonstrated each exercise. SLP ensured pt performance was correct prior to moving on to next exercise. Pt was instructed to complete this program 2 times a day, 6-7 days/week until 6 months after his last rad tx, then x2 a week after that.                  SLP Education - 09/23/20 1121    Education Details HEP procedure, late effects head/neck radiation    Person(s) Educated Patient    Methods Explanation;Demonstration;Verbal cues;Handout    Comprehension Verbalized understanding;Returned demonstration;Verbal cues required;Need further instruction            SLP Short Term Goals - 09/23/20 1128      SLP SHORT TERM GOAL #1   Title pt will complete HEP with rare min A    Time 1    Period --   sessions, for all STGs   Status New      SLP SHORT TERM GOAL #2   Title pt will tell SLP why pt is completing HEP with modified independence    Time 1    Status New      SLP SHORT TERM GOAL #3   Title pt will describe 3 overt s/s aspiration PNA with modified independence    Time 2    Status New      SLP SHORT TERM GOAL #4   Title pt will tell SLP how a food journal could hasten return to a more normalized diet    Time 3    Status New            SLP Long Term Goals - 09/23/20 1129      SLP LONG TERM GOAL #1   Title pt will complete HEP with modified independence over two visits    Time 4    Period --   visits, for all LTGs   Status New      SLP LONG TERM GOAL #2   Title pt will describe how to modify HEP over time, and the timeline associated with reduction in HEP frequency with modified independence over two sessions    Time 4    Status New            Plan - 09/23/20  1123    Clinical Impression Statement At this time pt swallowing is deemed [facility] with dys I/II items. SLP cautioned pt against minced meats with gravies however pt stated he only has this type  of food "not much - it's mostly the beans, and Oodles of Noodles." Pt demonstrated understanding he may need to incr the number of Ensures as his treatment progresses. SLP designed an individualized HEP for oral and pharyngeal dysphagia and pt completed each exercise on their own with occasional min cues. There are no overt s/s aspiration PNA reported by pt at this time. Data indicate that pt's swallow ability will likely decrease over the course of radiation therapy and could very well decline over time following conclusion of their radiation therapy due to muscle disuse atrophy and/or muscle fibrosis. Pt will cont to need to be seen by SLP in order to assess safety of PO intake, assess the need for recommending any objective swallow assessment, and ensuring pt correctly completes the individualized HEP.    Speech Therapy Frequency --   once approx every 4 weeks   Duration --   3 visits/90 days   Treatment/Interventions Aspiration precaution training;Pharyngeal strengthening exercises;Diet toleration management by SLP;Trials of upgraded texture/liquids;Patient/family education;SLP instruction and feedback;Compensatory techniques    Potential to Achieve Goals Good    Potential Considerations Severity of impairments    SLP Home Exercise Plan provided today    Consulted and Agree with Plan of Care Patient           Patient will benefit from skilled therapeutic intervention in order to improve the following deficits and impairments:   Oropharyngeal dysphagia    Problem List Patient Active Problem List   Diagnosis Date Noted  . 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 Sinus bradycardia   . Acute upper GI bleed 02/05/2020  . Calculus of gallbladder without cholecystitis without  obstruction   . Lung nodule < 6cm on CT 07/05/2019  . Tobacco use disorder 07/05/2019  . Loss of weight 03/05/2019  . Anemia 03/05/2019  . Abnormal CT of the abdomen 03/05/2019  . E coli Pyelonephritis 01/16/2019    [facility] ,MS, CCC-SLP  09/23/2020, 11:30 AM  [facility] [address] [place], [place], [address] Phone: [phone]   Fax:  [phone]  Name: Tanner Thomas MRN: [id number] Date of Birth: 03/26/1956

## 2020-09-23 NOTE — Progress Notes (Signed)
Oncology Nurse Navigator Documentation  Tanner Thomas presented for Head and Neck MDC today. He saw Garald Balding SLP and Allyson Sabal PT. He denied any questions and reports that treatment is going well. He knows to call me if he has any future needs or questions during his treatment.   Harlow Asa RN, BSN, OCN Head & Neck Oncology Nurse [facility] at [facility] Phone # [phone]  Fax # [phone]

## 2020-09-23 NOTE — Therapy (Signed)
[place], [place], [address] Phone: [phone]   Fax:  [phone]  Physical Therapy Evaluation  Patient Details  Name: Tanner Thomas MRN: [id number] Date of Birth: 12-01-1955 Referring Provider (PT): Reita May Date: 09/23/2020   PT End of Session - 09/23/20 1138    Visit Number 1    Number of Visits 2    Date for PT Re-Evaluation 11/18/20    PT Start Time 1110    PT Stop Time 1136    PT Time Calculation (min) 26 min    Activity Tolerance Patient tolerated treatment well    Behavior During Therapy [facility] for tasks assessed/performed           Past Medical History:  Diagnosis Date  . Known health problems: none     Past Surgical History:  Procedure Laterality Date  . BILIARY DILATION  10/30/2019   Procedure: BILIARY DILATION;  Surgeon: Milus Banister, MD;  Location[facility];  Service: Endoscopy;;  . BILIARY DILATION  02/05/2020   Procedure: BILIARY DILATION;  Surgeon: Milus Banister, MD;  Location[facility];  Service: Endoscopy;;  . BILIARY STENT PLACEMENT N/A 10/30/2019   Procedure: BILIARY STENT PLACEMENT;  Surgeon: Milus Banister, MD;  [facility];  Service: Endoscopy;  Laterality: N/A;  . BILIARY STENT PLACEMENT N/A 02/05/2020   Procedure: BILIARY STENT PLACEMENT;  Surgeon: Milus Banister, MD;  [facility];  Service: Endoscopy;  Laterality: N/A;  . BIOPSY  03/06/2019   Procedure: BIOPSY;  Surgeon: Daneil Dolin, MD;  [facility];  Service: Endoscopy;;  gastric  . BIOPSY  02/05/2020   Procedure: BIOPSY;  Surgeon: Milus Banister, MD;  [facility];  Service: Endoscopy;;  bile buct brushing  . CHOLECYSTECTOMY N/A 09/05/2019   Procedure: LAPAROSCOPIC CHOLECYSTECTOMY;  Surgeon: Aviva Signs, MD;  [facility];  Service: General;  Laterality: N/A;  . COLONOSCOPY WITH PROPOFOL N/A 03/06/2019   Procedure: COLONOSCOPY WITH  PROPOFOL;  Surgeon: Daneil Dolin, MD;  [facility];  Service: Endoscopy;  Laterality: N/A;  9:45am - ok at 10:00 per [facility]  . ENDOSCOPIC RETROGRADE CHOLANGIOPANCREATOGRAPHY (ERCP) WITH PROPOFOL N/A 10/30/2019   Procedure: ENDOSCOPIC RETROGRADE CHOLANGIOPANCREATOGRAPHY (ERCP) WITH PROPOFOL;  Surgeon: Milus Banister, MD;  [facility];  Service: Endoscopy;  Laterality: N/A;  . ENDOSCOPIC RETROGRADE CHOLANGIOPANCREATOGRAPHY (ERCP) WITH PROPOFOL N/A 02/05/2020   Procedure: ENDOSCOPIC RETROGRADE CHOLANGIOPANCREATOGRAPHY (ERCP) WITH PROPOFOL;  Surgeon: Milus Banister, MD;  [facility];  Service: Endoscopy;  Laterality: N/A;  . ESOPHAGOGASTRODUODENOSCOPY (EGD) WITH PROPOFOL N/A 03/06/2019   Procedure: ESOPHAGOGASTRODUODENOSCOPY (EGD) WITH PROPOFOL;  Surgeon: Daneil Dolin, MD;  [facility];  Service: Endoscopy;  Laterality: N/A;  . ESOPHAGOGASTRODUODENOSCOPY (EGD) WITH PROPOFOL N/A 08/21/2019   Procedure: ESOPHAGOGASTRODUODENOSCOPY (EGD) WITH PROPOFOL;  Surgeon: Milus Banister, MD;  [facility];  Service: Endoscopy;  Laterality: N/A;  . EUS N/A 08/21/2019   Procedure: UPPER ENDOSCOPIC ULTRASOUND (EUS) RADIAL;  Surgeon: Milus Banister, MD;  [facility];  Service: Endoscopy;  Laterality: N/A;  . PANCREAS SURGERY    . REMOVAL OF STONES  10/30/2019   Procedure: REMOVAL OF STONES;  Surgeon: Milus Banister, MD;  [facility];  Service: Endoscopy;;  . REMOVAL OF STONES  02/05/2020   Procedure: REMOVAL OF STONES;  Surgeon: Milus Banister, MD;  [facility];  Service: Endoscopy;;  . [name]  10/30/2019  Procedure: SPHINCTEROTOMY;  Surgeon: Milus Banister, MD;  [facility];  Service: Endoscopy;;  . [name]  02/05/2020   Procedure: [name];  Surgeon: Milus Banister, MD;  [facility]: [facility];  Service: Endoscopy;;  . Bess Kinds CHOLANGIOSCOPY N/A 02/05/2020   Procedure: [id number]  CHOLANGIOSCOPY;  Surgeon: Milus Banister, MD;  [facility];  Service: Endoscopy;  Laterality: N/A;  . STENT REMOVAL  02/05/2020   Procedure: STENT REMOVAL;  Surgeon: Milus Banister, MD;  [facility];  Service: Endoscopy;;    There were no vitals filed for this visit.    Subjective Assessment - 09/23/20 1108    Subjective I am feeling ok.    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  He also reported difficulty moving his tongue and alteration in taste, 05/03/20 Dr. Fredric Dine performed a biopsy of the floor of mouth and FNA of the left-sided neck mass. Pathology revealed invasive squamous cell carcinoma, moderately differentiated, 05/05/20 CT neck revealed an enhancing mass in the floor of the mouth that appeared to be causing obstruction of both ducts. Findings were felt to most likely represent a carcinoma on the floor of the mouth likely from the submandibular gland on the left. Additionally, there were enlarging heterogeneously enhancing level 1 A lymph nodes bilaterally, left greater than right, compatible with metastatic nodes. Finally, there was an additional possible level 1 B metastatic node, 06/02/20 PET completed revealing hypermetabolic infiltrative left oral cavity and focal uptake in left level 1B and 2 cervical nodes, consistent with primary neoplasm and metastatic adenopathy. There was no hypermetabolic distant metastatic disease evidence, 06/29/20 patient underwent a rim mandibulectomy and partial glossectomy by Dr. Vicie Mutters [facility]) that revealed invasive squamous cell carcinoma, moderately differentiated, keratinizing type. Final tumor size was 2.9 x 2.4 x 1.3 cm, with a combined greatest dimension of 4.7 cm. Tumor involved the bilateral sublingual salivary glands and there was also noted to be lymphovascular and perineural invasion present.  Inked inferior soft tissue margin of mandibulectomy was 1 mm from the invasive carcinoma and positive for carcinoma at the posterior soft tissue margin. Final Resection margins were negative for carcinoma by 2nn. 1/10 right neck and 1/6 left neck lymph nodes were positive for metastatic carcinoma.  No extracapsular extension. He also underwent dental extractions during the 06/27/20 procedure, will receive 30 fractions of radiation to his floor of mouth and bilateral neck. He started on 09/09/20 and will complete on 10/21/20, 08/24/20 Debridement of mandible due to exposed bone after original surgery.    Patient Stated Goals to gain info from provider    Currently in Pain? No/denies              [facility] PT Assessment - 09/23/20 0001      Assessment   Medical Diagnosis SCC floor of mouth    Referring Provider (PT) Squire    Onset Date/Surgical Date 05/03/20    Hand Dominance Right    Prior Therapy none      Precautions   Precautions Other (comment)    Precaution Comments active cancer      Restrictions   Weight Bearing Restrictions No      Balance Screen   Has the patient fallen in the past 6 months No    Has the patient had a decrease in activity level because of a fear of falling?  No    Is the  patient reluctant to leave their home because of a fear of falling?  No      Home Environment   Living Environment Private residence    Living Arrangements Other relatives   cousin   Available Help at Discharge Friend(s);Family    Type of Home House      Prior Function   Level of Independence Independent    Vocation On disability    Leisure pt reports he is active and mows the yard but does not set aside time to exercise      Cognition   Overall Cognitive Status Within Functional Limits for tasks assessed      Functional Tests   Functional tests Sit to Stand      Sit to Stand   Comments 30 sec sit to stand: 12 reps avg is 14   pt reports he has a bad R knee that has little cartilage      Posture/Postural Control   Posture/Postural Control Postural limitations    Postural Limitations Forward head;Rounded Shoulders      ROM / Strength   AROM / PROM / Strength AROM      AROM   AROM Assessment Site Cervical    Cervical Flexion WFL    Cervical Extension 50% limited    Cervical - Right Side Bend WFL    Cervical - Left Side Bend 25% limited    Cervical - Right Rotation 25% limited    Cervical - Left Rotation 35% limited      Ambulation/Gait   Ambulation/Gait Yes    Ambulation/Gait Assistance 7: Independent    Ambulation Distance (Feet) 10 Feet    Gait Pattern Narrow base of support             LYMPHEDEMA/ONCOLOGY QUESTIONNAIRE - 09/23/20 0001      Type   Cancer Type SCC floor of mouth      Lymphedema Assessments   Lymphedema Assessments Head and Neck      Head and Neck   4 cm superior to sternal notch around neck 37.7 cm    6 cm superior to sternal notch around neck 36.5 cm    8 cm superior to sternal notch around neck 36.5 cm                   Objective measurements completed on examination: See above findings.               PT Education - 09/23/20 1138    Education Details Neck ROM, importance of posture when sitting, standing and lying down, deep breathing, walking program and importance of staying active throughout treatment, CURE article on staying active, "Why exercise?" flyer, lymphedema and PT info    Person(s) Educated Patient    Methods Explanation;Handout    Comprehension Verbalized understanding               PT Long Term Goals - 09/23/20 1140      PT LONG TERM GOAL #1   Title Pt will return to baseline cervical ROM measurements and not demonstrate any signs or symptoms of lymphedema.    Time 8    Period Weeks    Status New    Target Date 11/18/20              Head and Neck Clinic Goals - 09/23/20 1140      Patient will be able to verbalize understanding of a home exercise program for cervical  range of motion, posture, and  walking.    Time 1    Period Days    Status Achieved      Patient will be able to verbalize understanding of proper sitting and standing posture.    Time 1    Period Days    Status Achieved      Patient will be able to verbalize understanding of lymphedema risk and availability of treatment for this condition.    Time 1    Period Days    Status Achieved              Plan - 09/23/20 1141    Clinical Impression Statement Pt presents to head and neck clinic with recently diagnosed squamous cell carcinoma of floor of mouth. He recently underwent a  rim mandibulectomy and partial glossectomy on 06/29/20. He is currently receiving radiation to floor of mouth and bilateral neck. His neck ROM is limited in certain directions due to recent surgery. Educated pt about importance of stretching to decrease tightness especially during radiation. Educated pt about signs and symptoms of lymphedema as well as anatomy and physiology of lymphatic system. Educated pt in importance of staying as active as possible throughout treatment to decrease fatigue as well as head and neck ROM exercises to decrease loss of ROM. Will see pt after completion of radiation to reassess ROM and assess for lymphedema to determine therapy needs at that time.    Stability/Clinical Decision Making Stable/Uncomplicated    Clinical Decision Making Low    Rehab Potential Good    PT Frequency --   eval and 1 f/u visit   PT Duration 8 weeks    PT Treatment/Interventions ADLs/Self Care Home Management;Patient/family education;Therapeutic exercise    PT Next Visit Plan reassess baselines    PT Home Exercise Plan head and neck ROM measurements    Consulted and Agree with Plan of Care Patient           Patient will benefit from skilled therapeutic intervention in order to improve the following deficits and impairments:  Postural dysfunction,Decreased knowledge of precautions  Visit  Diagnosis: Abnormal posture  Malignant neoplasm of overlapping sites of floor of mouth [facility])     Problem List Patient Active Problem List   Diagnosis Date Noted  . 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 ([place]) 08/23/2020  . Sinus bradycardia   . Acute upper GI bleed 02/05/2020  . Calculus of gallbladder without cholecystitis without obstruction   . Lung nodule < 6cm on CT 07/05/2019  . Tobacco use disorder 07/05/2019  . Loss of weight 03/05/2019  . Anemia 03/05/2019  . Abnormal CT of the abdomen 03/05/2019  . E coli Pyelonephritis 01/16/2019    Allyson Sabal [facility] 09/23/2020, 11:45 AM  [place], [place], [address] Phone: [phone]   Fax:  [phone]  Name: Tanner Thomas MRN: [id number] Date of Birth: 1955-08-25  Manus Gunning, PT 09/23/20 11:45 AM

## 2020-09-24 ENCOUNTER — Other Ambulatory Visit: Payer: Self-pay

## 2020-09-24 ENCOUNTER — Ambulatory Visit
Admission: RE | Admit: 2020-09-24 | Discharge: 2020-09-24 | Disposition: A | Discharge status: Home / Self Care | Payer: Medicaid Other | Source: Ambulatory Visit | Attending: Radiation Oncology | Admitting: Radiation Oncology

## 2020-09-24 ENCOUNTER — Ambulatory Visit: Payer: Medicaid Other

## 2020-09-24 DIAGNOSIS — Z87891 Personal history of nicotine dependence: Secondary | ICD-10-CM | POA: Diagnosis not present

## 2020-09-24 DIAGNOSIS — C049 Malignant neoplasm of floor of mouth, unspecified: Secondary | ICD-10-CM | POA: Diagnosis not present

## 2020-09-24 DIAGNOSIS — C041 Malignant neoplasm of lateral floor of mouth: Secondary | ICD-10-CM | POA: Diagnosis not present

## 2020-09-24 DIAGNOSIS — C770 Secondary and unspecified malignant neoplasm of lymph nodes of head, face and neck: Secondary | ICD-10-CM | POA: Diagnosis not present

## 2020-09-27 ENCOUNTER — Ambulatory Visit: Payer: Medicaid Other

## 2020-09-27 ENCOUNTER — Other Ambulatory Visit: Payer: Self-pay | Admitting: Radiation Oncology

## 2020-09-27 ENCOUNTER — Ambulatory Visit
Admission: RE | Admit: 2020-09-27 | Discharge: 2020-09-27 | Disposition: A | Discharge status: Home / Self Care | Payer: Medicaid Other | Source: Ambulatory Visit | Attending: Radiation Oncology | Admitting: Radiation Oncology

## 2020-09-27 DIAGNOSIS — C041 Malignant neoplasm of lateral floor of mouth: Secondary | ICD-10-CM | POA: Diagnosis not present

## 2020-09-27 DIAGNOSIS — C049 Malignant neoplasm of floor of mouth, unspecified: Secondary | ICD-10-CM

## 2020-09-27 DIAGNOSIS — Z87891 Personal history of nicotine dependence: Secondary | ICD-10-CM | POA: Diagnosis not present

## 2020-09-27 DIAGNOSIS — C770 Secondary and unspecified malignant neoplasm of lymph nodes of head, face and neck: Secondary | ICD-10-CM | POA: Diagnosis not present

## 2020-09-27 MED ORDER — SCOPOLAMINE 1 MG/3DAYS TD PT72: 1.0000 | MEDICATED_PATCH | TRANSDERMAL | 4 refills | Status: DC

## 2020-09-27 MED ORDER — HYDROCODONE-ACETAMINOPHEN 7.5-325 MG/15ML PO SOLN: 10.0000 mL | ORAL | 0 refills | Status: DC | PRN

## 2020-09-27 MED ORDER — SENNA 8.6 MG PO TABS: 1.0000 | ORAL_TABLET | Freq: Every evening | ORAL | 1 refills | Status: DC | PRN

## 2020-09-27 MED ORDER — FLUCONAZOLE 100 MG PO TABS: ORAL_TABLET | ORAL | 0 refills | Status: DC

## 2020-09-28 ENCOUNTER — Ambulatory Visit
Admission: RE | Admit: 2020-09-28 | Discharge: 2020-09-28 | Disposition: A | Discharge status: Home / Self Care | Payer: Medicaid Other | Source: Ambulatory Visit | Attending: Radiation Oncology | Admitting: Radiation Oncology

## 2020-09-28 ENCOUNTER — Other Ambulatory Visit: Payer: Self-pay

## 2020-09-28 ENCOUNTER — Ambulatory Visit: Payer: Medicaid Other

## 2020-09-28 DIAGNOSIS — Z87891 Personal history of nicotine dependence: Secondary | ICD-10-CM | POA: Diagnosis not present

## 2020-09-28 DIAGNOSIS — C041 Malignant neoplasm of lateral floor of mouth: Secondary | ICD-10-CM | POA: Diagnosis not present

## 2020-09-28 DIAGNOSIS — C770 Secondary and unspecified malignant neoplasm of lymph nodes of head, face and neck: Secondary | ICD-10-CM | POA: Diagnosis not present

## 2020-09-28 DIAGNOSIS — C049 Malignant neoplasm of floor of mouth, unspecified: Secondary | ICD-10-CM | POA: Diagnosis not present

## 2020-09-29 ENCOUNTER — Other Ambulatory Visit: Payer: Self-pay

## 2020-09-29 ENCOUNTER — Ambulatory Visit
Admission: RE | Admit: 2020-09-29 | Discharge: 2020-09-29 | Disposition: A | Discharge status: Home / Self Care | Payer: Medicaid Other | Source: Ambulatory Visit | Attending: Radiation Oncology | Admitting: Radiation Oncology

## 2020-09-29 ENCOUNTER — Ambulatory Visit: Payer: Medicaid Other

## 2020-09-29 DIAGNOSIS — C041 Malignant neoplasm of lateral floor of mouth: Secondary | ICD-10-CM | POA: Diagnosis not present

## 2020-09-29 DIAGNOSIS — Z87891 Personal history of nicotine dependence: Secondary | ICD-10-CM | POA: Diagnosis not present

## 2020-09-29 DIAGNOSIS — C770 Secondary and unspecified malignant neoplasm of lymph nodes of head, face and neck: Secondary | ICD-10-CM | POA: Diagnosis not present

## 2020-09-29 DIAGNOSIS — C049 Malignant neoplasm of floor of mouth, unspecified: Secondary | ICD-10-CM | POA: Diagnosis not present

## 2020-09-30 ENCOUNTER — Other Ambulatory Visit: Payer: Self-pay

## 2020-09-30 ENCOUNTER — Ambulatory Visit
Admission: RE | Admit: 2020-09-30 | Discharge: 2020-09-30 | Disposition: A | Discharge status: Home / Self Care | Payer: Medicaid Other | Source: Ambulatory Visit | Attending: Radiation Oncology | Admitting: Radiation Oncology

## 2020-09-30 ENCOUNTER — Ambulatory Visit: Payer: Medicaid Other

## 2020-09-30 DIAGNOSIS — C770 Secondary and unspecified malignant neoplasm of lymph nodes of head, face and neck: Secondary | ICD-10-CM | POA: Diagnosis not present

## 2020-09-30 DIAGNOSIS — C041 Malignant neoplasm of lateral floor of mouth: Secondary | ICD-10-CM | POA: Diagnosis not present

## 2020-09-30 DIAGNOSIS — Z87891 Personal history of nicotine dependence: Secondary | ICD-10-CM | POA: Diagnosis not present

## 2020-09-30 DIAGNOSIS — C049 Malignant neoplasm of floor of mouth, unspecified: Secondary | ICD-10-CM | POA: Diagnosis not present

## 2020-10-01 ENCOUNTER — Other Ambulatory Visit: Payer: Self-pay

## 2020-10-01 ENCOUNTER — Ambulatory Visit
Admission: RE | Admit: 2020-10-01 | Discharge: 2020-10-01 | Disposition: A | Discharge status: Home / Self Care | Payer: Medicaid Other | Source: Ambulatory Visit | Attending: Radiation Oncology | Admitting: Radiation Oncology

## 2020-10-01 ENCOUNTER — Ambulatory Visit: Payer: Medicaid Other

## 2020-10-01 DIAGNOSIS — C049 Malignant neoplasm of floor of mouth, unspecified: Secondary | ICD-10-CM | POA: Diagnosis not present

## 2020-10-01 DIAGNOSIS — C041 Malignant neoplasm of lateral floor of mouth: Secondary | ICD-10-CM | POA: Diagnosis not present

## 2020-10-01 DIAGNOSIS — C770 Secondary and unspecified malignant neoplasm of lymph nodes of head, face and neck: Secondary | ICD-10-CM | POA: Diagnosis not present

## 2020-10-01 DIAGNOSIS — Z87891 Personal history of nicotine dependence: Secondary | ICD-10-CM | POA: Diagnosis not present

## 2020-10-04 ENCOUNTER — Telehealth: Payer: Self-pay

## 2020-10-04 ENCOUNTER — Ambulatory Visit
Admission: RE | Admit: 2020-10-04 | Discharge: 2020-10-04 | Disposition: A | Discharge status: Home / Self Care | Payer: Medicaid Other | Source: Ambulatory Visit | Attending: Radiation Oncology | Admitting: Radiation Oncology

## 2020-10-04 ENCOUNTER — Ambulatory Visit: Payer: Medicaid Other

## 2020-10-04 DIAGNOSIS — C041 Malignant neoplasm of lateral floor of mouth: Secondary | ICD-10-CM | POA: Diagnosis not present

## 2020-10-04 DIAGNOSIS — C049 Malignant neoplasm of floor of mouth, unspecified: Secondary | ICD-10-CM | POA: Diagnosis not present

## 2020-10-04 DIAGNOSIS — C770 Secondary and unspecified malignant neoplasm of lymph nodes of head, face and neck: Secondary | ICD-10-CM | POA: Diagnosis not present

## 2020-10-04 DIAGNOSIS — Z87891 Personal history of nicotine dependence: Secondary | ICD-10-CM | POA: Diagnosis not present

## 2020-10-04 NOTE — Telephone Encounter (Signed)
Left VM informing patient's sister and patient that scopolamine patch prescription is available for pick up at [facility]. Provided my direct call back number should patient or his sister have any other questions/concerns

## 2020-10-05 ENCOUNTER — Ambulatory Visit: Payer: Medicaid Other

## 2020-10-05 ENCOUNTER — Other Ambulatory Visit: Payer: Self-pay

## 2020-10-05 ENCOUNTER — Ambulatory Visit
Admission: RE | Admit: 2020-10-05 | Discharge: 2020-10-05 | Disposition: A | Discharge status: Home / Self Care | Payer: Medicaid Other | Source: Ambulatory Visit | Attending: Radiation Oncology | Admitting: Radiation Oncology

## 2020-10-05 DIAGNOSIS — C049 Malignant neoplasm of floor of mouth, unspecified: Secondary | ICD-10-CM | POA: Diagnosis not present

## 2020-10-05 DIAGNOSIS — C770 Secondary and unspecified malignant neoplasm of lymph nodes of head, face and neck: Secondary | ICD-10-CM | POA: Diagnosis not present

## 2020-10-05 DIAGNOSIS — C041 Malignant neoplasm of lateral floor of mouth: Secondary | ICD-10-CM | POA: Diagnosis not present

## 2020-10-05 DIAGNOSIS — Z87891 Personal history of nicotine dependence: Secondary | ICD-10-CM | POA: Diagnosis not present

## 2020-10-06 ENCOUNTER — Ambulatory Visit: Payer: Medicaid Other

## 2020-10-06 ENCOUNTER — Ambulatory Visit
Admission: RE | Admit: 2020-10-06 | Discharge: 2020-10-06 | Disposition: A | Discharge status: Home / Self Care | Payer: Medicaid Other | Source: Ambulatory Visit | Attending: Radiation Oncology | Admitting: Radiation Oncology

## 2020-10-06 ENCOUNTER — Other Ambulatory Visit: Payer: Self-pay

## 2020-10-06 DIAGNOSIS — Z87891 Personal history of nicotine dependence: Secondary | ICD-10-CM | POA: Diagnosis not present

## 2020-10-06 DIAGNOSIS — C770 Secondary and unspecified malignant neoplasm of lymph nodes of head, face and neck: Secondary | ICD-10-CM | POA: Diagnosis not present

## 2020-10-06 DIAGNOSIS — C049 Malignant neoplasm of floor of mouth, unspecified: Secondary | ICD-10-CM | POA: Diagnosis not present

## 2020-10-06 DIAGNOSIS — C041 Malignant neoplasm of lateral floor of mouth: Secondary | ICD-10-CM | POA: Diagnosis not present

## 2020-10-07 ENCOUNTER — Ambulatory Visit
Admission: RE | Admit: 2020-10-07 | Discharge: 2020-10-07 | Disposition: A | Discharge status: Home / Self Care | Payer: Medicaid Other | Source: Ambulatory Visit | Attending: Radiation Oncology | Admitting: Radiation Oncology

## 2020-10-07 ENCOUNTER — Other Ambulatory Visit: Payer: Self-pay

## 2020-10-07 ENCOUNTER — Ambulatory Visit: Payer: Medicaid Other

## 2020-10-07 DIAGNOSIS — C770 Secondary and unspecified malignant neoplasm of lymph nodes of head, face and neck: Secondary | ICD-10-CM | POA: Diagnosis not present

## 2020-10-07 DIAGNOSIS — C049 Malignant neoplasm of floor of mouth, unspecified: Secondary | ICD-10-CM | POA: Diagnosis not present

## 2020-10-07 DIAGNOSIS — Z87891 Personal history of nicotine dependence: Secondary | ICD-10-CM | POA: Diagnosis not present

## 2020-10-07 DIAGNOSIS — C041 Malignant neoplasm of lateral floor of mouth: Secondary | ICD-10-CM | POA: Diagnosis not present

## 2020-10-08 ENCOUNTER — Ambulatory Visit
Admission: RE | Admit: 2020-10-08 | Discharge: 2020-10-08 | Disposition: A | Discharge status: Home / Self Care | Payer: Medicaid Other | Source: Ambulatory Visit | Attending: Radiation Oncology | Admitting: Radiation Oncology

## 2020-10-08 ENCOUNTER — Ambulatory Visit: Payer: Medicaid Other

## 2020-10-08 DIAGNOSIS — C770 Secondary and unspecified malignant neoplasm of lymph nodes of head, face and neck: Secondary | ICD-10-CM | POA: Diagnosis not present

## 2020-10-08 DIAGNOSIS — C049 Malignant neoplasm of floor of mouth, unspecified: Secondary | ICD-10-CM | POA: Diagnosis not present

## 2020-10-08 DIAGNOSIS — Z87891 Personal history of nicotine dependence: Secondary | ICD-10-CM | POA: Diagnosis not present

## 2020-10-08 DIAGNOSIS — C041 Malignant neoplasm of lateral floor of mouth: Secondary | ICD-10-CM | POA: Diagnosis not present

## 2020-10-11 ENCOUNTER — Other Ambulatory Visit: Payer: Self-pay

## 2020-10-11 ENCOUNTER — Ambulatory Visit
Admission: RE | Admit: 2020-10-11 | Discharge: 2020-10-11 | Disposition: A | Discharge status: Home / Self Care | Payer: Medicaid Other | Source: Ambulatory Visit | Attending: Radiation Oncology | Admitting: Radiation Oncology

## 2020-10-11 ENCOUNTER — Ambulatory Visit: Payer: Medicaid Other

## 2020-10-11 ENCOUNTER — Other Ambulatory Visit: Payer: Self-pay | Admitting: Radiation Oncology

## 2020-10-11 DIAGNOSIS — C041 Malignant neoplasm of lateral floor of mouth: Secondary | ICD-10-CM | POA: Diagnosis not present

## 2020-10-11 DIAGNOSIS — C770 Secondary and unspecified malignant neoplasm of lymph nodes of head, face and neck: Secondary | ICD-10-CM | POA: Diagnosis not present

## 2020-10-11 DIAGNOSIS — C049 Malignant neoplasm of floor of mouth, unspecified: Secondary | ICD-10-CM

## 2020-10-11 DIAGNOSIS — Z87891 Personal history of nicotine dependence: Secondary | ICD-10-CM | POA: Diagnosis not present

## 2020-10-11 LAB — BASIC METABOLIC PANEL - CANCER CENTER ONLY
Anion gap: 13 (ref 5–15)
BUN: 13 mg/dL (ref 8–23)
CO2: 28 mmol/L (ref 22–32)
Calcium: 9.8 mg/dL (ref 8.9–10.3)
Chloride: 94 mmol/L — ABNORMAL LOW (ref 98–111)
Creatinine: 0.7 mg/dL (ref 0.61–1.24)
GFR, Estimated: >60 mL/min (ref >60)
Glucose, Bld: 125 mg/dL — ABNORMAL HIGH (ref 70–99)
Potassium: 4 mmol/L (ref 3.5–5.1)
Sodium: 135 mmol/L (ref 135–145)

## 2020-10-11 MED ORDER — HYDROCODONE-ACETAMINOPHEN 7.5-325 MG/15ML PO SOLN: 10.0000 mL | ORAL | 0 refills | Status: DC | PRN

## 2020-10-11 MED ORDER — LIDOCAINE VISCOUS HCL 2 % MT SOLN: OROMUCOSAL | 2 refills | Status: AC

## 2020-10-12 ENCOUNTER — Ambulatory Visit: Payer: Medicaid Other

## 2020-10-12 ENCOUNTER — Ambulatory Visit
Admission: RE | Admit: 2020-10-12 | Discharge: 2020-10-12 | Disposition: A | Discharge status: Home / Self Care | Payer: Medicaid Other | Source: Ambulatory Visit | Attending: Radiation Oncology | Admitting: Radiation Oncology

## 2020-10-12 DIAGNOSIS — C049 Malignant neoplasm of floor of mouth, unspecified: Secondary | ICD-10-CM | POA: Diagnosis not present

## 2020-10-12 DIAGNOSIS — Z87891 Personal history of nicotine dependence: Secondary | ICD-10-CM | POA: Diagnosis not present

## 2020-10-12 DIAGNOSIS — C770 Secondary and unspecified malignant neoplasm of lymph nodes of head, face and neck: Secondary | ICD-10-CM | POA: Diagnosis not present

## 2020-10-12 DIAGNOSIS — C041 Malignant neoplasm of lateral floor of mouth: Secondary | ICD-10-CM | POA: Diagnosis not present

## 2020-10-13 ENCOUNTER — Ambulatory Visit: Payer: Medicaid Other

## 2020-10-13 ENCOUNTER — Other Ambulatory Visit: Payer: Self-pay

## 2020-10-13 ENCOUNTER — Ambulatory Visit
Admission: RE | Admit: 2020-10-13 | Discharge: 2020-10-13 | Disposition: A | Discharge status: Home / Self Care | Payer: Medicaid Other | Source: Ambulatory Visit | Attending: Radiation Oncology | Admitting: Radiation Oncology

## 2020-10-13 DIAGNOSIS — C041 Malignant neoplasm of lateral floor of mouth: Secondary | ICD-10-CM | POA: Diagnosis not present

## 2020-10-13 DIAGNOSIS — C770 Secondary and unspecified malignant neoplasm of lymph nodes of head, face and neck: Secondary | ICD-10-CM | POA: Diagnosis not present

## 2020-10-13 DIAGNOSIS — C049 Malignant neoplasm of floor of mouth, unspecified: Secondary | ICD-10-CM | POA: Diagnosis not present

## 2020-10-13 DIAGNOSIS — Z87891 Personal history of nicotine dependence: Secondary | ICD-10-CM | POA: Diagnosis not present

## 2020-10-14 ENCOUNTER — Ambulatory Visit
Admission: RE | Admit: 2020-10-14 | Discharge: 2020-10-14 | Disposition: A | Discharge status: Home / Self Care | Payer: Medicaid Other | Source: Ambulatory Visit | Attending: Radiation Oncology | Admitting: Radiation Oncology

## 2020-10-14 ENCOUNTER — Ambulatory Visit: Payer: Medicaid Other

## 2020-10-14 DIAGNOSIS — C049 Malignant neoplasm of floor of mouth, unspecified: Secondary | ICD-10-CM | POA: Diagnosis not present

## 2020-10-14 DIAGNOSIS — Z87891 Personal history of nicotine dependence: Secondary | ICD-10-CM | POA: Diagnosis not present

## 2020-10-14 DIAGNOSIS — C770 Secondary and unspecified malignant neoplasm of lymph nodes of head, face and neck: Secondary | ICD-10-CM | POA: Diagnosis not present

## 2020-10-14 DIAGNOSIS — C041 Malignant neoplasm of lateral floor of mouth: Secondary | ICD-10-CM | POA: Diagnosis not present

## 2020-10-15 ENCOUNTER — Ambulatory Visit: Payer: Medicaid Other

## 2020-10-15 ENCOUNTER — Ambulatory Visit
Admission: RE | Admit: 2020-10-15 | Discharge: 2020-10-15 | Disposition: A | Discharge status: Home / Self Care | Payer: Medicaid Other | Source: Ambulatory Visit | Attending: Radiation Oncology | Admitting: Radiation Oncology

## 2020-10-15 DIAGNOSIS — C049 Malignant neoplasm of floor of mouth, unspecified: Secondary | ICD-10-CM | POA: Diagnosis not present

## 2020-10-15 DIAGNOSIS — C041 Malignant neoplasm of lateral floor of mouth: Secondary | ICD-10-CM | POA: Diagnosis not present

## 2020-10-15 DIAGNOSIS — Z87891 Personal history of nicotine dependence: Secondary | ICD-10-CM | POA: Diagnosis not present

## 2020-10-15 DIAGNOSIS — C770 Secondary and unspecified malignant neoplasm of lymph nodes of head, face and neck: Secondary | ICD-10-CM | POA: Diagnosis not present

## 2020-10-18 ENCOUNTER — Ambulatory Visit
Admission: RE | Admit: 2020-10-18 | Discharge: 2020-10-18 | Disposition: A | Discharge status: Home / Self Care | Payer: Medicaid Other | Source: Ambulatory Visit | Attending: Radiation Oncology | Admitting: Radiation Oncology

## 2020-10-18 ENCOUNTER — Other Ambulatory Visit: Payer: Self-pay

## 2020-10-18 ENCOUNTER — Ambulatory Visit: Payer: Medicaid Other

## 2020-10-18 DIAGNOSIS — C041 Malignant neoplasm of lateral floor of mouth: Secondary | ICD-10-CM | POA: Diagnosis not present

## 2020-10-18 DIAGNOSIS — Z87891 Personal history of nicotine dependence: Secondary | ICD-10-CM | POA: Diagnosis not present

## 2020-10-18 DIAGNOSIS — C049 Malignant neoplasm of floor of mouth, unspecified: Secondary | ICD-10-CM | POA: Diagnosis not present

## 2020-10-18 DIAGNOSIS — C770 Secondary and unspecified malignant neoplasm of lymph nodes of head, face and neck: Secondary | ICD-10-CM | POA: Diagnosis not present

## 2020-10-19 ENCOUNTER — Ambulatory Visit: Payer: Medicaid Other

## 2020-10-19 ENCOUNTER — Other Ambulatory Visit: Payer: Self-pay

## 2020-10-19 ENCOUNTER — Ambulatory Visit
Admission: RE | Admit: 2020-10-19 | Discharge: 2020-10-19 | Disposition: A | Discharge status: Home / Self Care | Payer: Medicaid Other | Source: Ambulatory Visit | Attending: Radiation Oncology | Admitting: Radiation Oncology

## 2020-10-19 ENCOUNTER — Other Ambulatory Visit: Payer: Self-pay | Admitting: Radiation Oncology

## 2020-10-19 DIAGNOSIS — C041 Malignant neoplasm of lateral floor of mouth: Secondary | ICD-10-CM | POA: Diagnosis not present

## 2020-10-19 DIAGNOSIS — C770 Secondary and unspecified malignant neoplasm of lymph nodes of head, face and neck: Secondary | ICD-10-CM | POA: Diagnosis not present

## 2020-10-19 DIAGNOSIS — C049 Malignant neoplasm of floor of mouth, unspecified: Secondary | ICD-10-CM

## 2020-10-19 DIAGNOSIS — Z87891 Personal history of nicotine dependence: Secondary | ICD-10-CM | POA: Diagnosis not present

## 2020-10-19 MED ORDER — HYDROCODONE-ACETAMINOPHEN 7.5-325 MG/15ML PO SOLN: 10.0000 mL | ORAL | 0 refills | Status: DC | PRN

## 2020-10-20 ENCOUNTER — Ambulatory Visit: Payer: Medicaid Other

## 2020-10-20 ENCOUNTER — Ambulatory Visit
Admission: RE | Admit: 2020-10-20 | Discharge: 2020-10-20 | Disposition: A | Discharge status: Home / Self Care | Payer: Medicaid Other | Source: Ambulatory Visit | Attending: Radiation Oncology | Admitting: Radiation Oncology

## 2020-10-20 DIAGNOSIS — C041 Malignant neoplasm of lateral floor of mouth: Secondary | ICD-10-CM | POA: Diagnosis not present

## 2020-10-20 DIAGNOSIS — Z87891 Personal history of nicotine dependence: Secondary | ICD-10-CM | POA: Diagnosis not present

## 2020-10-20 DIAGNOSIS — C049 Malignant neoplasm of floor of mouth, unspecified: Secondary | ICD-10-CM | POA: Diagnosis not present

## 2020-10-20 DIAGNOSIS — C770 Secondary and unspecified malignant neoplasm of lymph nodes of head, face and neck: Secondary | ICD-10-CM | POA: Diagnosis not present

## 2020-10-21 ENCOUNTER — Other Ambulatory Visit: Payer: Self-pay

## 2020-10-21 ENCOUNTER — Ambulatory Visit: Payer: Medicaid Other

## 2020-10-21 ENCOUNTER — Ambulatory Visit
Admission: RE | Admit: 2020-10-21 | Discharge: 2020-10-21 | Disposition: A | Discharge status: Home / Self Care | Payer: Medicaid Other | Source: Ambulatory Visit | Attending: Radiation Oncology | Admitting: Radiation Oncology

## 2020-10-21 ENCOUNTER — Encounter: Payer: Self-pay | Admitting: Radiation Oncology

## 2020-10-21 DIAGNOSIS — C770 Secondary and unspecified malignant neoplasm of lymph nodes of head, face and neck: Secondary | ICD-10-CM | POA: Diagnosis not present

## 2020-10-21 DIAGNOSIS — Z87891 Personal history of nicotine dependence: Secondary | ICD-10-CM | POA: Diagnosis not present

## 2020-10-21 DIAGNOSIS — C041 Malignant neoplasm of lateral floor of mouth: Secondary | ICD-10-CM | POA: Diagnosis not present

## 2020-10-21 DIAGNOSIS — C049 Malignant neoplasm of floor of mouth, unspecified: Secondary | ICD-10-CM | POA: Diagnosis not present

## 2020-10-21 NOTE — Progress Notes (Signed)
Oncology Nurse Navigator Documentation   Met with Tanner Thomas final RT to offer support and to celebrate end of radiation treatment.   Provided verbal/written post-RT guidance: Importance of keeping all follow-up appts, especially those with Nutrition and SLP. Importance of protecting treatment area from sun. Continuation of Sonafine application 2-3 times daily, application of antibiotic ointment to areas of raw skin; when supply of Sonafine exhausted transition to OTC lotion with vitamin E. Provided/reviewed Epic calendar of upcoming appts. Explained my role as navigator will continue for several more months, encouraged him to call me with needs/concerns.    Harlow Asa RN, BSN, OCN Head & Neck Oncology Nurse [place] at [facility] Phone # [phone]  Fax # [phone]

## 2020-10-22 ENCOUNTER — Ambulatory Visit: Payer: Medicaid Other

## 2020-10-26 ENCOUNTER — Ambulatory Visit: Payer: Medicaid Other

## 2020-10-27 ENCOUNTER — Telehealth: Payer: Self-pay | Admitting: Licensed Clinical Social Worker

## 2020-10-27 ENCOUNTER — Ambulatory Visit: Payer: Medicaid Other

## 2020-10-27 NOTE — Telephone Encounter (Signed)
CSW received VM from pt's sister, Rodena Piety, asking about setting up rides for upcoming appts.  CSW returned call and sister stated that she spoke with the nurse coordinator and rides are set. CSW also informed her that family may call transportation directly to set up rides for future [facility] appts. Rodena Piety expressed understanding and thanks.   Christeen Douglas, LCSW

## 2020-10-28 ENCOUNTER — Ambulatory Visit: Payer: Medicaid Other

## 2020-10-29 ENCOUNTER — Other Ambulatory Visit: Payer: Self-pay

## 2020-10-29 ENCOUNTER — Ambulatory Visit: Payer: Medicaid Other

## 2020-10-29 ENCOUNTER — Ambulatory Visit: Payer: Medicaid Other | Attending: Radiation Oncology

## 2020-10-29 DIAGNOSIS — R1312 Dysphagia, oropharyngeal phase: Secondary | ICD-10-CM | POA: Insufficient documentation

## 2020-10-29 DIAGNOSIS — R471 Dysarthria and anarthria: Secondary | ICD-10-CM | POA: Insufficient documentation

## 2020-10-29 NOTE — Therapy (Signed)
[facility] [address] [place], [place], [address] Phone: [phone]   Fax:  [phone]  Speech Language Pathology Treatment  Patient Details  Name: Tanner Thomas MRN: [id number] Date of Birth: [birth year] Referring Provider (SLP): Eppie Gibson, MD   Encounter Date: 10/29/2020   End of Session - 10/29/20 1707     Visit Number 2    Number of Visits 4    Date for SLP Re-Evaluation 12/22/20    SLP Start Time 6734    SLP Stop Time  1405    SLP Time Calculation (min) 1352 min    Activity Tolerance Patient tolerated treatment well             Past Medical History:  Diagnosis Date   Known health problems: none     Past Surgical History:  Procedure Laterality Date   BILIARY DILATION  10/30/2019   Procedure: BILIARY DILATION;  Surgeon: Milus Banister, MD;  [facility];  Service: Endoscopy;;   BILIARY DILATION  02/05/2020   Procedure: BILIARY DILATION;  Surgeon: Milus Banister, MD;  [facility];  Service: Endoscopy;;   BILIARY STENT PLACEMENT N/A 10/30/2019   Procedure: BILIARY STENT PLACEMENT;  Surgeon: Milus Banister, MD;  [facility];  Service: Endoscopy;  Laterality: N/A;   BILIARY STENT PLACEMENT N/A 02/05/2020   Procedure: BILIARY STENT PLACEMENT;  Surgeon: Milus Banister, MD;  [facility];  Service: Endoscopy;  Laterality: N/A;   BIOPSY  03/06/2019   Procedure: BIOPSY;  Surgeon: Daneil Dolin, MD;  [facility];  Service: Endoscopy;;  gastric   BIOPSY  02/05/2020   Procedure: BIOPSY;  Surgeon: Milus Banister, MD;  [facility];  Service: Endoscopy;;  bile buct brushing   CHOLECYSTECTOMY N/A 09/05/2019   Procedure: LAPAROSCOPIC CHOLECYSTECTOMY;  Surgeon: Aviva Signs, MD;  [facility];  Service: General;  Laterality: N/A;   COLONOSCOPY WITH PROPOFOL N/A 03/06/2019   Procedure: COLONOSCOPY WITH PROPOFOL;  Surgeon: Daneil Dolin, MD;  [facility];  Service: Endoscopy;  Laterality: N/A;  9:45am - ok at 10:00 per [facility]   ENDOSCOPIC RETROGRADE CHOLANGIOPANCREATOGRAPHY (ERCP) WITH PROPOFOL N/A 10/30/2019   Procedure: ENDOSCOPIC RETROGRADE CHOLANGIOPANCREATOGRAPHY (ERCP) WITH PROPOFOL;  Surgeon: Milus Banister, MD;  [facility];  Service: Endoscopy;  Laterality: N/A;   ENDOSCOPIC RETROGRADE CHOLANGIOPANCREATOGRAPHY (ERCP) WITH PROPOFOL N/A 02/05/2020   Procedure: ENDOSCOPIC RETROGRADE CHOLANGIOPANCREATOGRAPHY (ERCP) WITH PROPOFOL;  Surgeon: Milus Banister, MD;  [facility];  Service: Endoscopy;  Laterality: N/A;   ESOPHAGOGASTRODUODENOSCOPY (EGD) WITH PROPOFOL N/A 03/06/2019   Procedure: ESOPHAGOGASTRODUODENOSCOPY (EGD) WITH PROPOFOL;  Surgeon: Daneil Dolin, MD;  [facility];  Service: Endoscopy;  Laterality: N/A;   ESOPHAGOGASTRODUODENOSCOPY (EGD) WITH PROPOFOL N/A 08/21/2019   Procedure: ESOPHAGOGASTRODUODENOSCOPY (EGD) WITH PROPOFOL;  Surgeon: Milus Banister, MD;  [facility];  Service: Endoscopy;  Laterality: N/A;   EUS N/A 08/21/2019   Procedure: UPPER ENDOSCOPIC ULTRASOUND (EUS) RADIAL;  Surgeon: Milus Banister, MD;  [facility];  Service: Endoscopy;  Laterality: N/A;   PANCREAS SURGERY     REMOVAL OF STONES  10/30/2019   Procedure: REMOVAL OF STONES;  Surgeon: Milus Banister, MD;  [facility];  Service: Endoscopy;;   REMOVAL OF STONES  02/05/2020   Procedure: REMOVAL OF STONES;  Surgeon: Milus Banister, MD;  [facility];  Service: Endoscopy;;   SPHINCTEROTOMY  10/30/2019   Procedure: SPHINCTEROTOMY;  Surgeon: Milus Banister, MD;  [facility];  Service: Endoscopy;;   SPHINCTEROTOMY  02/05/2020   Procedure: [name];  Surgeon: Milus Banister, MD;  [facility];  Service: Endoscopy;;   MPNTIRWE CHOLANGIOSCOPY N/A 02/05/2020   Procedure: [id number] CHOLANGIOSCOPY;  Surgeon: Milus Banister,  MD;  [facility];  Service: Endoscopy;  Laterality: N/A;   STENT REMOVAL  02/05/2020   Procedure: STENT REMOVAL;  Surgeon: Milus Banister, MD;  [facility];  Service: Endoscopy;;    There were no vitals filed for this visit.   Subjective Assessment - 10/29/20 1548     Subjective Pt denies coughing with POs (soups, Boost, Ensure, water).    Currently in Pain? No/denies                   ADULT SLP TREATMENT - 10/29/20 1549       General Information   Behavior/Cognition Alert;Cooperative;Pleasant mood      Treatment Provided   Treatment provided Dysphagia      Dysphagia Treatment   Temperature Spikes Noted No    Respiratory Status Room air    Oral Cavity - Dentition Missing dentition    Treatment Methods Skilled observation;Therapeutic exercise;Compensation strategy training;Patient/caregiver education    Patient observed directly with PO's Yes    Type of PO's observed Thin liquids    Oral Phase Signs & Symptoms --   none noted   Pharyngeal Phase Signs & Symptoms Other (comment)   none noted   Other treatment/comments pt is drinking liquids two weeks out from his end date of rad tx. No oral or pharyngeal difficulties noted today with sips water. Pt req'd rare min A for HEP procedure. SLP educated pt about food journal and about need for dietitian follow up.      Assessment / Recommendations / Plan   Plan Continue with current plan of care      Dysphagia Recommendations   Diet recommendations Dysphagia 1 (puree);Thin liquid   as tolerated   Medication Administration --   as tolerated     Progression Toward Goals   Progression toward goals Progressing toward goals              SLP Education - 10/29/20 1706     Education Details food journal, importance of dietitian follow up    Person(s) Educated Patient    Methods Explanation    Comprehension Verbalized understanding              SLP Short Term Goals - 10/29/20 1617        SLP SHORT TERM GOAL #1   Title pt will complete HEP with rare min A    Period --   sessions, for all STGs   Status Achieved      SLP SHORT TERM GOAL #2   Title pt will tell SLP why pt is completing HEP with modified independence    Status Achieved      SLP SHORT TERM GOAL #3   Title pt will describe 3 overt s/s aspiration PNA with modified independence    Time 1    Status On-going      SLP SHORT TERM GOAL #4   Title pt will tell SLP how a food journal could hasten return to a more normalized diet    Status Achieved              SLP Long Term Goals - 10/29/20 1617       SLP  LONG TERM GOAL #1   Title pt will complete HEP with modified independence over two visits    Time 4    Period --   visits, for all LTGs   Status On-going      SLP LONG TERM GOAL #2   Title pt will describe how to modify HEP over time, and the timeline associated with reduction in HEP frequency with modified independence over two sessions    Time 4    Status On-going              Plan - 10/29/20 1707     Clinical Impression Statement At this time pt swallowing is deemed [facility] with thin liquids. SLP reviewed pt's individualized HEP for oral and pharyngeal dysphagia and pt completed each exercise on their own with rare min cues. There are no overt s/s aspiration PNA reported by pt at this time. Data indicate that pt's swallow ability will likely decrease over the course of radiation therapy and could very well decline over time following conclusion of their radiation therapy due to muscle disuse atrophy and/or muscle fibrosis. Pt will cont to need to be seen by SLP in order to assess safety of PO intake, assess the need for recommending any objective swallow assessment, and ensuring pt correctly completes the individualized HEP.    Speech Therapy Frequency --   once approx every 4 weeks   Duration --   3 visits/90 days   Treatment/Interventions Aspiration precaution training;Pharyngeal strengthening  exercises;Diet toleration management by SLP;Trials of upgraded texture/liquids;Patient/family education;SLP instruction and feedback;Compensatory techniques    Potential to Achieve Goals Good    Potential Considerations Severity of impairments    SLP Home Exercise Plan provided today    Consulted and Agree with Plan of Care Patient             Patient will benefit from skilled therapeutic intervention in order to improve the following deficits and impairments:   Oropharyngeal dysphagia  Dysarthria and anarthria    Problem List Patient Active Problem List   Diagnosis Date Noted   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 ([place]) 08/23/2020   Sinus bradycardia    Acute upper GI bleed 02/05/2020   Calculus of gallbladder without cholecystitis without obstruction    Lung nodule < 6cm on CT 07/05/2019   Tobacco use disorder 07/05/2019   Loss of weight 03/05/2019   Anemia 03/05/2019   Abnormal CT of the abdomen 03/05/2019   E coli Pyelonephritis 01/16/2019    [facility] ,[place], CCC-SLP  10/29/2020, 5:09 PM  [facility] [address] [place], [place], [address] Phone: [phone]   Fax:  [phone]   Name: Tanner Thomas MRN: [id number] Date of Birth: 22-Dec-1955

## 2020-11-01 ENCOUNTER — Ambulatory Visit: Payer: Medicaid Other

## 2020-11-02 ENCOUNTER — Ambulatory Visit: Payer: Medicaid Other

## 2020-11-03 ENCOUNTER — Encounter: Payer: Self-pay | Admitting: General Practice

## 2020-11-03 NOTE — Progress Notes (Signed)
[facility] CSW Progress Notes  Request from [employer], nurse navigator, to contact sister w options for help obtaining nutritional supplements.  Per RN, patient is scheduled to meet w dietitian who will provide resources as available.  CSW called sister, no answer, left VM w contact information for [employer] (Phone: [phone]).  Patient can apply for financial assistance from this entity and they may be able to assist if patient meets their eligibility qualifications.  Edwyna Shell, LCSW Clinical Social Worker Phone:  [phone]

## 2020-11-08 ENCOUNTER — Other Ambulatory Visit: Payer: Self-pay | Admitting: Radiation Oncology

## 2020-11-08 DIAGNOSIS — C049 Malignant neoplasm of floor of mouth, unspecified: Secondary | ICD-10-CM

## 2020-11-08 MED ORDER — HYDROCODONE-ACETAMINOPHEN 7.5-325 MG/15ML PO SOLN: 10.0000 mL | ORAL | 0 refills | Status: DC | PRN

## 2020-11-10 ENCOUNTER — Other Ambulatory Visit: Payer: Self-pay

## 2020-11-10 ENCOUNTER — Ambulatory Visit
Admission: RE | Admit: 2020-11-10 | Discharge: 2020-11-10 | Disposition: A | Discharge status: Home / Self Care | Payer: Medicaid Other | Source: Ambulatory Visit | Attending: Radiation Oncology | Admitting: Radiation Oncology

## 2020-11-10 ENCOUNTER — Inpatient Hospital Stay: Payer: Medicaid Other | Admitting: Dietician

## 2020-11-10 ENCOUNTER — Other Ambulatory Visit: Payer: Self-pay | Admitting: Radiation Oncology

## 2020-11-10 VITALS — BP 94/71 | HR 65 | Temp 97.0°F | Resp 18 | Wt 123.4 lb

## 2020-11-10 DIAGNOSIS — C041 Malignant neoplasm of lateral floor of mouth: Secondary | ICD-10-CM | POA: Insufficient documentation

## 2020-11-10 DIAGNOSIS — Z79899 Other long term (current) drug therapy: Secondary | ICD-10-CM | POA: Diagnosis not present

## 2020-11-10 DIAGNOSIS — C049 Malignant neoplasm of floor of mouth, unspecified: Secondary | ICD-10-CM

## 2020-11-10 DIAGNOSIS — C048 Malignant neoplasm of overlapping sites of floor of mouth: Secondary | ICD-10-CM

## 2020-11-10 DIAGNOSIS — Z923 Personal history of irradiation: Secondary | ICD-10-CM | POA: Insufficient documentation

## 2020-11-10 DIAGNOSIS — K862 Cyst of pancreas: Secondary | ICD-10-CM

## 2020-11-10 NOTE — Progress Notes (Signed)
Nutrition  Patient did not show for scheduled nutrition appointment. Received message from nurse navigator who reports pt was unaware of appointment. Will attempt to contact patient via telephone on 77/21.

## 2020-11-10 NOTE — Progress Notes (Addendum)
Tanner Thomas presents today for follow-up after completing radiation to his floor of mouth on 10/21/2020  Pain issues, if any: Reports continued oral discomfort (states pain is usually 7 out of 10 without much relief from liquid hydrocodone) Using a feeding tube?: N/A Weight changes, if any: Reports a stable appetite; Has recently purchased a case of Ensure to drink in-between meals to try and regain some of the weight he lost during treatment  Wt Readings from Last 3 Encounters:  11/10/20 123 lb 6.4 oz (56 kg)  08/18/20 131 lb (59.4 kg)  02/05/20 146 lb (66.2 kg)   Swallowing issues, if any: Patient denies. Reports he can eat a wide variety of food and beverages  Smoking or chewing tobacco? None Using fluoride trays daily? N/A--denies any new dental concerns, but still dealing with poor dental on lower left side Last ENT visit was on: Not since diagnosis Other notable issues, if any: Continues to deal with dry mouth, thick saliva, and decreased sense of taste. Denies any jaw pain or difficulty opening  his mouth, but does report an occasional dull ache deep inside his right ear. He states his gums and tongue often feel swollen at night when he goes to sleep. Skin in treatment field remains hyperpigmented in some areas. Patient reports occasional itching, but states he continues to apply Sonafine lotion.   Vitals:   11/10/20 1432  BP: 94/71  Pulse: 65  Resp: 18  Temp: (!) 97 F (36.1 C)  SpO2: 100%

## 2020-11-11 ENCOUNTER — Ambulatory Visit: Payer: Medicaid Other | Attending: Radiation Oncology | Admitting: Physical Therapy

## 2020-11-11 ENCOUNTER — Encounter (HOSPITAL_COMMUNITY): Payer: Medicaid Other | Admitting: Dietician

## 2020-11-11 ENCOUNTER — Encounter: Payer: Self-pay | Admitting: Physical Therapy

## 2020-11-11 ENCOUNTER — Encounter: Payer: Self-pay | Admitting: Radiation Oncology

## 2020-11-11 ENCOUNTER — Telehealth (HOSPITAL_COMMUNITY): Payer: Self-pay | Admitting: Dietician

## 2020-11-11 DIAGNOSIS — R293 Abnormal posture: Secondary | ICD-10-CM | POA: Diagnosis not present

## 2020-11-11 DIAGNOSIS — C048 Malignant neoplasm of overlapping sites of floor of mouth: Secondary | ICD-10-CM | POA: Diagnosis present

## 2020-11-11 NOTE — Telephone Encounter (Signed)
Nutrition Assessment   ASSESSMENT: 65 year old male with floor of mouth cancer. He is s/p tracheostomy, mandibulectomy, dental extractions, partial glossectomy, and feeding tube placement (removed 4/25) at [facility]. He is completed radiation therapy for floor of mouth cancer on 6/30.   Past medical history includes acute upper GIB, anemia, calculus of gallbladder without cholecystitis, tobacco use    Spoke with sister of patient Rodena Piety) via telephone after patient missed nutrition appointment on 7/20. Sister reports patient is not eating very much, he complains of dry mouth, thick saliva, diminished taste. Patient is tolerating some soft foods. He likes chicken noodle and cream of potato soups, "but that's about it" Sister reports he is drinking Ensure, but limited intake due to cost. Sister reports patient given 2 cases of Ensure from [facility] last week and has been drinking 3-4/day. Sister is concerned with amount of weight patient has lost, asking about foods he can safely eat to help him gain weight as well as resources for oral nutrition supplements.   Nutrition Focused Physical Exam: unable to complete   Medications: diflucan, hycet, lidocaine, scopolamine, senokot    Labs: 6/20 - glucose 125   Anthropometrics:   Height: 5'10" Weight: 56 kg (7/20) UBW: 146 lb (01/2020) BMI: 17.71 (underweight)   Estimated Energy Needs  Kcals: [id number] Protein: 90-101 Fluid: 2 L/   NUTRITION DIAGNOSIS: Unintentional weight loss related to cancer and associated treatments as evidenced by 15.8% (23 lb) weight loss in 10 months   MALNUTRITION DIAGNOSIS: Patient is currently underweight, given recent weight history and reported decreased oral intake highly suspect patient meets criteria for malnutrition, however unable to identify at this time without completion of nutrition focused physical exam   INTERVENTION:  Educated on soft, moist, high protein foods - will mail handout with  ideas Discussed ways to add calories and protein to foods - will mail high calorie recipes Recommend drinking 4 Ensure Plus/Boost Plus daily to support desired weight gain Patient qualifies for Medicaid oral nutrition supplement assistance - [facility] contacted and assisting with submission of prior authorization needs Continue baking soda/salt water rinses several times daily  MONITORING, EVALUATION, GOAL: Patient will tolerate increased calories and protein to promote weight increase   Next Visit: Monday, August 1 in clinic (sister aware of appointment)

## 2020-11-11 NOTE — Progress Notes (Signed)
Radiation Oncology         [phone] ________________________________  Name: Tanner Thomas MRN: [id number]  Date: 11/10/2020  DOB: 10/27/1955  Follow-Up Visit Note  CC: Pcp, No  Tanner Bender, MD  Diagnosis and Prior Radiotherapy:       ICD-10-CM   1. 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  Tanner Thomas presents today for follow-up after completing radiation to his floor of mouth on 10/21/2020  Pain issues, if any: Reports continued oral discomfort (states pain is usually 7 out of 10 without much relief from liquid hydrocodone) Using a feeding tube?: N/A Weight changes, if any: Reports a stable appetite; Has recently purchased a case of Ensure to drink in-between meals to try and regain some of the weight he lost during treatment - weight improved from last week of RT Wt Readings from Last 3 Encounters:  11/10/20 123 lb 6.4 oz (56 kg)  08/18/20 131 lb (59.4 kg)  02/05/20 146 lb (66.2 kg)   Swallowing issues, if any: Patient denies. Reports he can eat a wide variety of food and beverages  Smoking or chewing tobacco? None Using fluoride trays daily? N/A--denies any new dental concerns, but still dealing with poor dental on lower left side Last ENT visit was on: Not since diagnosis Other notable issues, if any: Continues to deal with dry mouth, thick saliva, and decreased sense of taste. Denies any jaw pain or difficulty opening  his mouth, but does report an occasional dull ache deep inside his right ear. He states his gums and tongue often feel swollen at night when he goes to sleep. Skin in treatment field remains hyperpigmented in some areas. Patient reports occasional itching, but states he continues to apply Sonafine lotion.   Vitals:   11/10/20 1432   BP: 94/71  Pulse: 65  Resp: 18  Temp: (!) 97 F (36.1 C)  SpO2: 100%                       ALLERGIES:  has No Known Allergies.  Meds: Current Outpatient Medications  Medication Sig Dispense Refill   fluconazole (DIFLUCAN) 100 MG tablet Take 2 tablets today, then 1 tablet daily x 20 more days. 22 tablet 0   HYDROcodone-acetaminophen (HYCET) 7.5-325 mg/15 ml solution Take 10-15 mLs by mouth every 4 (four) hours as needed for moderate pain. 473 mL 0   lidocaine (XYLOCAINE) 2 % solution Patient: Mix 1part 2% viscous lidocaine, 1part H20. Swish & swallow 62mL of diluted mixture, 100min before meals and at bedtime, up to QID 300 mL 2   scopolamine (TRANSDERM-SCOP) 1 MG/3DAYS Place 1 patch (1.5 mg total) onto the skin every 3 (three) days. 10 patch 4   senna (SENOKOT) 8.6 MG TABS tablet Take 1 tablet (8.6 mg total) by mouth at bedtime as needed for mild constipation. 30 tablet 1   No current facility-administered medications for this encounter.    Physical Findings: The patient is in no acute distress. Patient is alert and oriented. Wt Readings from Last 3 Encounters:  11/10/20 123 lb 6.4 oz (56 kg)  08/18/20 131 lb (59.4 kg)  02/05/20 146 lb (66.2 kg)    weight is 123 lb 6.4  oz (56 kg). His temperature is 97 F (36.1 C) (abnormal). His blood pressure is 94/71 and his pulse is 65. His respiration is 18 and oxygen saturation is 100%. .  General: Alert and oriented, in no acute distress HEENT: Head is normocephalic. Extraocular movements are intact. Oropharynx /mouth negative for thrush or visible tumor; satisfactory healing noted Neck: Neck is notable for no masses Skin: Skin in treatment fields shows satisfactory healing  Psychiatric: Judgment and insight are intact. Affect is appropriate.   Lab Findings: Lab Results  Component Value Date   WBC 8.0 02/05/2020   HGB 13.1 02/06/2020   HCT 40.1 02/06/2020   MCV 94.4 02/05/2020   PLT 112 (L) 02/05/2020    Lab Results   Component Value Date   TSH 3.044 02/05/2020    Radiographic Findings: No results found.  Impression/Plan:    1) Head and Neck Cancer Status: healing well from post op RT  2) Nutritional Status: improved, gained weight since completing RT PEG tube: none  3) Risk Factors: The patient has been educated about risk factors including alcohol and tobacco abuse; they understand that avoidance of alcohol and tobacco is important to prevent recurrences as well as other cancers  4) Swallowing: good function, continue SLP exercises  5) Dental: Encouraged to continue regular followup with dentistry, and dental hygiene including fluoride rinses.  Referring to Dr. Benson Norway.  6) Thyroid function:  Lab Results  Component Value Date   TSH 3.044 02/05/2020    7)  Follow-up in 2 months w/ restaging CT scans. The patient was encouraged to call with any issues or questions before then.  On date of service, in total, I spent 20 minutes on this encounter. Patient was seen in person. _____________________________________   Tanner Gibson, MD

## 2020-11-11 NOTE — Therapy (Signed)
[place], [place], [address] Phone: [phone]   Fax:  [phone]  Physical Therapy Treatment  Patient Details  Name: Tanner Thomas MRN: [id number] Date of Birth: Aug 23, 1955 Referring Provider (PT): Reita May Date: 11/11/2020   PT End of Session - 11/11/20 1315     Visit Number 2    Number of Visits 2    Date for PT Re-Evaluation 11/18/20    PT Start Time 1254    PT Stop Time 1306    PT Time Calculation (min) 12 min    Activity Tolerance Patient tolerated treatment well    Behavior During Therapy [facility] for tasks assessed/performed             Past Medical History:  Diagnosis Date   Known health problems: none     Past Surgical History:  Procedure Laterality Date   BILIARY DILATION  10/30/2019   Procedure: BILIARY DILATION;  Surgeon: Milus Banister, MD;  Location[facility];  Service: Endoscopy;;   BILIARY DILATION  02/05/2020   Procedure: BILIARY DILATION;  Surgeon: Milus Banister, MD;  [facility];  Service: Endoscopy;;   BILIARY STENT PLACEMENT N/A 10/30/2019   Procedure: BILIARY STENT PLACEMENT;  Surgeon: Milus Banister, MD;  [facility]: [facility];  Service: Endoscopy;  Laterality: N/A;   BILIARY STENT PLACEMENT N/A 02/05/2020   Procedure: BILIARY STENT PLACEMENT;  Surgeon: Milus Banister, MD;  [facility];  Service: Endoscopy;  Laterality: N/A;   BIOPSY  03/06/2019   Procedure: BIOPSY;  Surgeon: Daneil Dolin, MD;  [facility];  Service: Endoscopy;;  gastric   BIOPSY  02/05/2020   Procedure: BIOPSY;  Surgeon: Milus Banister, MD;  [facility]: [facility];  Service: Endoscopy;;  bile buct brushing   CHOLECYSTECTOMY N/A 09/05/2019   Procedure: LAPAROSCOPIC CHOLECYSTECTOMY;  Surgeon: Aviva Signs, MD;  [facility]: [facility];  Service: General;  Laterality: N/A;   COLONOSCOPY WITH PROPOFOL N/A 03/06/2019   Procedure: COLONOSCOPY WITH  PROPOFOL;  Surgeon: Daneil Dolin, MD;  [facility];  Service: Endoscopy;  Laterality: N/A;  9:45am - ok at 10:00 per Melanie   ENDOSCOPIC RETROGRADE CHOLANGIOPANCREATOGRAPHY (ERCP) WITH PROPOFOL N/A 10/30/2019   Procedure: ENDOSCOPIC RETROGRADE CHOLANGIOPANCREATOGRAPHY (ERCP) WITH PROPOFOL;  Surgeon: Milus Banister, MD;  [facility];  Service: Endoscopy;  Laterality: N/A;   ENDOSCOPIC RETROGRADE CHOLANGIOPANCREATOGRAPHY (ERCP) WITH PROPOFOL N/A 02/05/2020   Procedure: ENDOSCOPIC RETROGRADE CHOLANGIOPANCREATOGRAPHY (ERCP) WITH PROPOFOL;  Surgeon: Milus Banister, MD;  [facility];  Service: Endoscopy;  Laterality: N/A;   ESOPHAGOGASTRODUODENOSCOPY (EGD) WITH PROPOFOL N/A 03/06/2019   Procedure: ESOPHAGOGASTRODUODENOSCOPY (EGD) WITH PROPOFOL;  Surgeon: Daneil Dolin, MD;  [facility];  Service: Endoscopy;  Laterality: N/A;   ESOPHAGOGASTRODUODENOSCOPY (EGD) WITH PROPOFOL N/A 08/21/2019   Procedure: ESOPHAGOGASTRODUODENOSCOPY (EGD) WITH PROPOFOL;  Surgeon: Milus Banister, MD;  [facility];  Service: Endoscopy;  Laterality: N/A;   EUS N/A 08/21/2019   Procedure: UPPER ENDOSCOPIC ULTRASOUND (EUS) RADIAL;  Surgeon: Milus Banister, MD;  [facility];  Service: Endoscopy;  Laterality: N/A;   PANCREAS SURGERY     REMOVAL OF STONES  10/30/2019   Procedure: REMOVAL OF STONES;  Surgeon: Milus Banister, MD;  [facility];  Service: Endoscopy;;   REMOVAL OF STONES  02/05/2020   Procedure: REMOVAL OF STONES;  Surgeon: Milus Banister, MD;  [facility];  Service: Endoscopy;;   [name]  10/30/2019   Procedure: SPHINCTEROTOMY;  Surgeon: Milus Banister, MD;  [facility];  Service: Endoscopy;;   SPHINCTEROTOMY  02/05/2020   Procedure: [name];  Surgeon: Milus Banister, MD;  Location: [facility];  Service: Endoscopy;;   [id number] CHOLANGIOSCOPY N/A 02/05/2020   Procedure: [id number]  CHOLANGIOSCOPY;  Surgeon: Milus Banister, MD;  [facility];  Service: Endoscopy;  Laterality: N/A;   STENT REMOVAL  02/05/2020   Procedure: STENT REMOVAL;  Surgeon: Milus Banister, MD;  [facility];  Service: Endoscopy;;    There were no vitals filed for this visit.   Subjective Assessment - 11/11/20 1256     Subjective I am doing ok. My energy is doing pretty good. I have not noticed any swelling.    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  He also reported difficulty moving his tongue and alteration in taste, 05/03/20 Dr. Fredric Dine performed a biopsy of the floor of mouth and FNA of the left-sided neck mass. Pathology revealed invasive squamous cell carcinoma, moderately differentiated, 05/05/20 CT neck revealed an enhancing mass in the floor of the mouth that appeared to be causing obstruction of both ducts. Findings were felt to most likely represent a carcinoma on the floor of the mouth likely from the submandibular gland on the left. Additionally, there were enlarging heterogeneously enhancing level 1 A lymph nodes bilaterally, left greater than right, compatible with metastatic nodes. Finally, there was an additional possible level 1 B metastatic node, 06/02/20 PET completed revealing hypermetabolic infiltrative left oral cavity and focal uptake in left level 1B and 2 cervical nodes, consistent with primary neoplasm and metastatic adenopathy. There was no hypermetabolic distant metastatic disease evidence, 06/29/20 patient underwent a rim mandibulectomy and partial glossectomy by Dr. Vicie Mutters [facility]) that revealed invasive squamous cell carcinoma, moderately differentiated, keratinizing type. Final tumor size was 2.9 x 2.4 x 1.3 cm, with a combined greatest dimension of 4.7 cm. Tumor involved the bilateral sublingual salivary glands and there was also  noted to be lymphovascular and perineural invasion present. Inked inferior soft tissue margin of mandibulectomy was 1 mm from the invasive carcinoma and positive for carcinoma at the posterior soft tissue margin. Final Resection margins were negative for carcinoma by 2nn. 1/10 right neck and 1/6 left neck lymph nodes were positive for metastatic carcinoma.  No extracapsular extension. He also underwent dental extractions during the 06/27/20 procedure, will receive 30 fractions of radiation to his floor of mouth and bilateral neck. He started on 09/09/20 and will complete on 10/21/20, 08/24/20 Debridement of mandible due to exposed bone after original surgery.    Patient Stated Goals to gain info from provider    Currently in Pain? No/denies                [facility] PT Assessment - 11/11/20 0001       Assessment   Medical Diagnosis SCC floor of mouth    Referring Provider (PT) Isidore Moos    Onset Date/Surgical Date 05/03/20    Hand Dominance Right    Prior Therapy none      Precautions   Precautions Other (comment)    Precaution Comments lymphedema      Restrictions   Weight Bearing Restrictions No      Home Environment   Living Environment Private residence    Living Arrangements Other relatives   cousin   Available Help  at Discharge Friend(s);Family    Type of Home House      Prior Function   Level of Independence Independent    Vocation On disability    Leisure pt reports he is active and mows the yard but does not set aside time to exercise      Cognition   Overall Cognitive Status Within Functional Limits for tasks assessed      Functional Tests   Functional tests Sit to Stand      Sit to Stand   Comments 30 sec sit to stand: 15 reps avg is 14   pt reports he has a bad R knee that has little cartilage     Posture/Postural Control   Posture/Postural Control Postural limitations    Postural Limitations Forward head;Rounded Shoulders      AROM   Cervical Flexion WFL     Cervical Extension 25% limited    Cervical - Right Side Bend WFL    Cervical - Left Side Bend WFL    Cervical - Right Rotation 25% limited    Cervical - Left Rotation 25% limited      Ambulation/Gait   Ambulation/Gait Yes    Ambulation/Gait Assistance 7: Independent    Ambulation Distance (Feet) 10 Feet    Gait Pattern Narrow base of support               LYMPHEDEMA/ONCOLOGY QUESTIONNAIRE - 11/11/20 0001       Head and Neck   4 cm superior to sternal notch around neck 35.6 cm    6 cm superior to sternal notch around neck 35.5 cm    8 cm superior to sternal notch around neck 35.5 cm                                     PT Long Term Goals - 11/11/20 1311       PT LONG TERM GOAL #1   Title Pt will return to baseline cervical ROM measurements and not demonstrate any signs or symptoms of lymphedema.    Time 8    Period Weeks    Status Achieved                   Plan - 11/11/20 1311     Clinical Impression Statement Pt returns to PT after completing radiation for squamous cell carcinoma of floor of mouth. He previously underwent a rim mandibulectomy and partial glossectomy on 06/29/20. Pt is doing excellent. He reports he has not had any decrease in energy since starting treatment. His neck ROM has improve since baseline and pt reports he has been doing the stretches consistently and holding them for 15 seconds each. He was able to complete more sit to stands in 30 secs than at baseline - today he was at 15 reps adn 14 is average. Pt currently does not demonstrate any signs of lymphedema. Educated pt to look for any signs of swelling and to let his doctor know if he develops any swelling. Pt currently has no needs for skilled PT services and will be discharged at this time.    PT Frequency --   eval and 1 f/u   PT Duration 8 weeks    PT Treatment/Interventions ADLs/Self Care Home Management;Patient/family education;Therapeutic exercise    PT  Next Visit Plan d/c this visit    PT Home Exercise Plan head and neck ROM exercises  Consulted and Agree with Plan of Care Patient             Patient will benefit from skilled therapeutic intervention in order to improve the following deficits and impairments:  Postural dysfunction, Decreased knowledge of precautions  Visit Diagnosis: Abnormal posture  Malignant neoplasm of overlapping sites of floor of mouth [facility])     Problem List Patient Active Problem List   Diagnosis Date Noted   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 ([place]) 08/23/2020   Sinus bradycardia    Acute upper GI bleed 02/05/2020   Calculus of gallbladder without cholecystitis without obstruction    Lung nodule < 6cm on CT 07/05/2019   Tobacco use disorder 07/05/2019   Loss of weight 03/05/2019   Anemia 03/05/2019   Abnormal CT of the abdomen 03/05/2019   E coli Pyelonephritis 01/16/2019    Allyson Sabal [facility] 11/11/2020, 1:17 PM  [place], [place], [address] Phone: [phone]   Fax:  [phone]  Name: Tanner Thomas MRN: [id number] Date of Birth: March 26, 1956   Manus Gunning, PT 11/11/20 1:18 PM  PHYSICAL THERAPY DISCHARGE SUMMARY  Visits from Start of Care: 2  Current functional level related to goals / functional outcomes: Pt has met goals for therapy   Remaining deficits: None    Education / Equipment: HEP   Patient agrees to discharge. Patient goals were met. Patient is being discharged due to meeting the stated rehab goals.  Allyson Sabal [place], [place] 11/11/20 1:19 PM

## 2020-11-18 DIAGNOSIS — C041 Malignant neoplasm of lateral floor of mouth: Secondary | ICD-10-CM | POA: Diagnosis not present

## 2020-11-22 ENCOUNTER — Other Ambulatory Visit (HOSPITAL_COMMUNITY): Payer: Medicaid Other | Admitting: Dentistry

## 2020-11-22 ENCOUNTER — Other Ambulatory Visit: Payer: Self-pay

## 2020-11-22 ENCOUNTER — Ambulatory Visit: Payer: Medicaid Other | Attending: Radiation Oncology

## 2020-11-22 ENCOUNTER — Inpatient Hospital Stay: Payer: Medicaid Other | Attending: Dietician | Admitting: Nutrition

## 2020-11-22 DIAGNOSIS — R471 Dysarthria and anarthria: Secondary | ICD-10-CM | POA: Diagnosis present

## 2020-11-22 DIAGNOSIS — R1312 Dysphagia, oropharyngeal phase: Secondary | ICD-10-CM | POA: Insufficient documentation

## 2020-11-22 NOTE — Patient Instructions (Addendum)
  Do all of these 10-15 times, twice a day  1) SWALLOW AS HARD AS YOU CAN  2) PUSH YOUR TONGUE TO THE ROOF OF YOUR MOUTH and hold 3 seconds  3) KEEP YOUR TONGUE OUT AND SWALLOW  4) SWALLOW AS HARD AS YOU CAN - FOR 5 SECONDS  5) HOLD YOUR BREATH - SWALLOW AND COUGH  6) STRETCH YOUR TONGUE AS FAR AS YOU CAN IN ALL DIRECTIONS   Signs of Aspiration Pneumonia   Chest pain/tightness Fever (can be low grade) Cough  With foul-smelling phlegm (sputum) With sputum containing pus or blood With greenish sputum Fatigue  Shortness of breath  Wheezing   **IF YOU HAVE THESE SIGNS, CONTACT YOUR DOCTOR OR GO TO THE EMERGENCY DEPARTMENT OR URGENT CARE AS SOON AS POSSIBLE**

## 2020-11-22 NOTE — Therapy (Signed)
[facility] [address] [place], [place], [address] Phone: [phone]   Fax:  [phone]  Speech Language Pathology Treatment  Patient Details  Name: Tanner Thomas MRN: [id number] Date of Birth: [birth year] Referring Provider (SLP): Eppie Gibson, MD   Encounter Date: 11/22/2020   End of Session - 11/22/20 1620     Visit Number 3    Number of Visits 4    Date for SLP Re-Evaluation 12/22/20    SLP Start Time [id number]    SLP Stop Time  1602    SLP Time Calculation (min) 27 min    Activity Tolerance Patient tolerated treatment well             Past Medical History:  Diagnosis Date   Known health problems: none     Past Surgical History:  Procedure Laterality Date   BILIARY DILATION  10/30/2019   Procedure: BILIARY DILATION;  Surgeon: Milus Banister, MD;  Location: [facility];  Service: Endoscopy;;   BILIARY DILATION  02/05/2020   Procedure: BILIARY DILATION;  Surgeon: Milus Banister, MD;  Location: [facility];  Service: Endoscopy;;   BILIARY STENT PLACEMENT N/A 10/30/2019   Procedure: BILIARY STENT PLACEMENT;  Surgeon: Milus Banister, MD;  [facility];  Service: Endoscopy;  Laterality: N/A;   BILIARY STENT PLACEMENT N/A 02/05/2020   Procedure: BILIARY STENT PLACEMENT;  Surgeon: Milus Banister, MD;  [facility];  Service: Endoscopy;  Laterality: N/A;   BIOPSY  03/06/2019   Procedure: BIOPSY;  Surgeon: Daneil Dolin, MD;  [facility];  Service: Endoscopy;;  gastric   BIOPSY  02/05/2020   Procedure: BIOPSY;  Surgeon: Milus Banister, MD;  [facility];  Service: Endoscopy;;  bile buct brushing   CHOLECYSTECTOMY N/A 09/05/2019   Procedure: LAPAROSCOPIC CHOLECYSTECTOMY;  Surgeon: Aviva Signs, MD;  [facility];  Service: General;  Laterality: N/A;   COLONOSCOPY WITH PROPOFOL N/A 03/06/2019   Procedure: COLONOSCOPY WITH PROPOFOL;  Surgeon: Daneil Dolin,  MD;  [facility];  Service: Endoscopy;  Laterality: N/A;  9:45am - ok at 10:00 per Melanie   ENDOSCOPIC RETROGRADE CHOLANGIOPANCREATOGRAPHY (ERCP) WITH PROPOFOL N/A 10/30/2019   Procedure: ENDOSCOPIC RETROGRADE CHOLANGIOPANCREATOGRAPHY (ERCP) WITH PROPOFOL;  Surgeon: Milus Banister, MD;  [facility];  Service: Endoscopy;  Laterality: N/A;   ENDOSCOPIC RETROGRADE CHOLANGIOPANCREATOGRAPHY (ERCP) WITH PROPOFOL N/A 02/05/2020   Procedure: ENDOSCOPIC RETROGRADE CHOLANGIOPANCREATOGRAPHY (ERCP) WITH PROPOFOL;  Surgeon: Milus Banister, MD;  [facility];  Service: Endoscopy;  Laterality: N/A;   ESOPHAGOGASTRODUODENOSCOPY (EGD) WITH PROPOFOL N/A 03/06/2019   Procedure: ESOPHAGOGASTRODUODENOSCOPY (EGD) WITH PROPOFOL;  Surgeon: Daneil Dolin, MD;  [facility];  Service: Endoscopy;  Laterality: N/A;   ESOPHAGOGASTRODUODENOSCOPY (EGD) WITH PROPOFOL N/A 08/21/2019   Procedure: ESOPHAGOGASTRODUODENOSCOPY (EGD) WITH PROPOFOL;  Surgeon: Milus Banister, MD;  [facility];  Service: Endoscopy;  Laterality: N/A;   EUS N/A 08/21/2019   Procedure: UPPER ENDOSCOPIC ULTRASOUND (EUS) RADIAL;  Surgeon: Milus Banister, MD;  [facility];  Service: Endoscopy;  Laterality: N/A;   PANCREAS SURGERY     REMOVAL OF STONES  10/30/2019   Procedure: REMOVAL OF STONES;  Surgeon: Milus Banister, MD;  [facility];  Service: Endoscopy;;   REMOVAL OF STONES  02/05/2020   Procedure: REMOVAL OF STONES;  Surgeon: Milus Banister, MD;  [facility];  Service: Endoscopy;;   SPHINCTEROTOMY  10/30/2019   Procedure: SPHINCTEROTOMY;  Surgeon: Milus Banister, MD;  Location: [facility];  Service: Endoscopy;;   SPHINCTEROTOMY  02/05/2020   Procedure: [name];  Surgeon: Milus Banister, MD;  Location: [facility];  Service: Endoscopy;;   [id number] CHOLANGIOSCOPY N/A 02/05/2020   Procedure: [id number] CHOLANGIOSCOPY;  Surgeon: Milus Banister, MD;   [facility];  Service: Endoscopy;  Laterality: N/A;   STENT REMOVAL  02/05/2020   Procedure: STENT REMOVAL;  Surgeon: Milus Banister, MD;  [facility];  Service: Endoscopy;;    There were no vitals filed for this visit.   Subjective Assessment - 11/22/20 1533     Subjective "My sister is going to call (MD for pain meds)."    Currently in Pain? No/denies                   ADULT SLP TREATMENT - 11/22/20 1534       General Information   Behavior/Cognition Alert;Cooperative;Pleasant mood      Treatment Provided   Treatment provided Dysphagia      Dysphagia Treatment   Temperature Spikes Noted No    Respiratory Status Room air    Oral Cavity - Dentition Missing dentition    Treatment Methods Skilled observation;Therapeutic exercise;Compensation strategy training;Patient/caregiver education    Patient observed directly with PO's Yes    Type of PO's observed Thin liquids;Dysphagia 1 (puree)    Pharyngeal Phase Signs & Symptoms Multiple swallows   1-2 additional swallows, but no hydrophonia/gurgle to pt's voice   Other treatment/comments Pt arrives today with handouts from dietician. After observing pt with applesauce and with Boost, SLP suggested dys I-II items such as cheese/minced sausage grits with gravy, pureed chicken wiht chicken gravy, etc. HEP was completed with consistent max A - pt stated he had done HEP every day but showed SLP one stretching exercise as what he was doing at home. SLP provided "simplified" HEP for pt (see pt instructions).      Pain Assessment   Pain Assessment 0-10    Pain Score 5     Pain Location rt ear radiating into rt SCM muscle      Assessment / Recommendations / Plan   Plan Continue with current plan of care      Dysphagia Recommendations   Diet recommendations Dysphagia 2 (fine chop);Dysphagia 1 (puree);Thin liquid      Progression Toward Goals   Progression toward goals Progressing toward goals               SLP Education - 11/22/20 1559     Education Details procedure for HEP, s/sx aspiration PNA    Person(s) Educated Patient    Methods Explanation;Handout;Demonstration;Verbal cues    Comprehension Verbalized understanding;Returned demonstration;Verbal cues required;Need further instruction              SLP Short Term Goals - 11/22/20 1557       SLP SHORT TERM GOAL #1   Title pt will complete HEP with rare min A    Period --   sessions, for all STGs   Status Achieved      SLP SHORT TERM GOAL #2   Title pt will tell SLP why pt is completing HEP with modified independence    Status Achieved      SLP SHORT TERM GOAL #3   Title pt will describe 3 overt s/s aspiration PNA with modified independence    Status Achieved      SLP SHORT TERM GOAL #4   Title pt will  tell SLP how a food journal could hasten return to a more normalized diet    Status Achieved              SLP Long Term Goals - 11/22/20 1643       SLP LONG TERM GOAL #1   Title pt will complete HEP with modified independence over two visits    Time 3    Period --   visits, for all LTGs   Status On-going      SLP LONG TERM GOAL #2   Title pt will describe how to modify HEP over time, and the timeline associated with reduction in HEP frequency with modified independence over two sessions    Time 3    Status On-going              Plan - 11/22/20 1643     Clinical Impression Statement At this time pt swallowing is deemed WFL/WNL with dys I-II items and thin liquids. SLP reviewed pt's individualized HEP for oral and pharyngeal dysphagia and pt completed each exercise on their own with max cues faded to modified independent. There are no overt s/s aspiration PNA reported by pt at this time. Data indicate that pt's swallow ability will likely decrease over the course of radiation therapy and could very well decline over time following conclusion of their radiation therapy due to muscle disuse atrophy and/or  muscle fibrosis. Pt will cont to need to be seen by SLP in order to assess safety of PO intake, assess the need for recommending any objective swallow assessment, and ensuring pt correctly completes the individualized HEP.    Speech Therapy Frequency --   once approx every 4 weeks   Duration --   3 visits/90 days   Treatment/Interventions Aspiration precaution training;Pharyngeal strengthening exercises;Diet toleration management by SLP;Trials of upgraded texture/liquids;Patient/family education;SLP instruction and feedback;Compensatory techniques    Potential to Achieve Goals Good    Potential Considerations Severity of impairments    SLP Home Exercise Plan provided today    Consulted and Agree with Plan of Care Patient             Patient will benefit from skilled therapeutic intervention in order to improve the following deficits and impairments:   Oropharyngeal dysphagia  Dysarthria and anarthria    Problem List Patient Active Problem List   Diagnosis Date Noted   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 ,[place], [place]  11/22/2020, 4:44 PM  [facility] [address] [place], [place], [address] Phone: [phone]   Fax:  [phone]   Name: Tanner Thomas MRN: [id number] Date of Birth: 02-Feb-1956

## 2020-11-22 NOTE — Progress Notes (Signed)
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  He is status post tracheostomy, mandibulectomy, dental extractions, partial glossectomy, and feeding tube placement which was removed at [facility] on April 25.  Patient completed radiation therapy on June 30.  Weight decreased and documented as 120.6 pounds on August 1 decreased from 123.4 pounds July 20. Patient reports he has thickened saliva and dry mouth.  He thinks it has improved a little.   Reports his biggest concern is that it is very painful to swallow and he does not have any pain medication.  States he ran out 2 weeks ago but has not called MD. Reports he only tolerates plain soup and boost plus.  Reports he has received boost plus and has 2 big boxes which was delivered this morning.  He has been drinking 4-5 cartons daily. (Provides 1440-1800 cal, 56-70 g protein)  Estimated nutrition needs: 1960-2240 cal, 90-101 g protein, 2 L fluid.  Nutrition diagnosis: Unintended weight loss continues.  Intervention: Educated patient to increase variety following full liquid diet sheet.  Provided patient with a nutrition fact sheet.  I encouraged him to add liquids to these foods as needed to help ease swallowing.  Provided examples of meals.  Recommended patient increase boost plus to 6 cartons daily. (Provides 2160 cal, 84 g protein) Educated on strategies for improving dry mouth/thick saliva.  Recommended baking soda and salt water rinses.  Provided nutrition facts sheet. Provided contact information. Recommended patient contact MD for additional pain medications per her discretion.  Monitoring, evaluation, goals: Patient will tolerate increased calories and protein to promote weight gain.  Next visit: Thursday, September 1 in the office.  **Disclaimer: This note was dictated with voice recognition software. Similar sounding words can inadvertently be transcribed and this  note may contain transcription errors which may not have been corrected upon publication of note.**

## 2020-11-23 ENCOUNTER — Other Ambulatory Visit: Payer: Self-pay | Admitting: Radiation Oncology

## 2020-11-23 DIAGNOSIS — C049 Malignant neoplasm of floor of mouth, unspecified: Secondary | ICD-10-CM

## 2020-11-23 MED ORDER — HYDROCODONE-ACETAMINOPHEN 7.5-325 MG/15ML PO SOLN: 10.0000 mL | ORAL | 0 refills | Status: DC | PRN

## 2020-11-29 ENCOUNTER — Emergency Department (HOSPITAL_COMMUNITY)
Admission: EM | Admit: 2020-11-29 | Discharge: 2020-11-29 | Disposition: A | Discharge status: Home / Self Care | Payer: Medicaid Other | Attending: Emergency Medicine | Admitting: Emergency Medicine

## 2020-11-29 ENCOUNTER — Encounter (HOSPITAL_COMMUNITY): Payer: Self-pay | Admitting: *Deleted

## 2020-11-29 ENCOUNTER — Telehealth: Payer: Self-pay | Admitting: Nutrition

## 2020-11-29 ENCOUNTER — Other Ambulatory Visit: Payer: Self-pay

## 2020-11-29 DIAGNOSIS — K5900 Constipation, unspecified: Secondary | ICD-10-CM | POA: Diagnosis not present

## 2020-11-29 DIAGNOSIS — Z85818 Personal history of malignant neoplasm of other sites of lip, oral cavity, and pharynx: Secondary | ICD-10-CM | POA: Diagnosis not present

## 2020-11-29 DIAGNOSIS — Z87891 Personal history of nicotine dependence: Secondary | ICD-10-CM | POA: Insufficient documentation

## 2020-11-29 MED ORDER — SORBITOL 70 % SOLN
960.0000 mL | TOPICAL_OIL | Freq: Once | ORAL | Status: AC
  Administered 2020-11-29: 960 mL via RECTAL
  Filled 2020-11-29: qty 473

## 2020-11-29 NOTE — Telephone Encounter (Signed)
R/s appt per 8/5 sch msg. Pt's sister is aware.

## 2020-11-29 NOTE — ED Notes (Signed)
Pt VSS. Enema completed, pt lying in bed on side with BSC and call bell within reach. Awaiting bowel movement

## 2020-11-29 NOTE — ED Provider Notes (Signed)
[facility] EMERGENCY DEPARTMENT Provider Note   CSN: [id number] Arrival date & time: 11/29/20  1209     History Chief Complaint  Patient presents with   Constipation    Tanner Thomas is a 65 y.o. male.   Constipation  Patient presents to the ED with complaints of constipation.  Patient states he has not had a bowel movement for the last 4 days and he generally has 1 daily.  He has tried MiraLAX without any relief.  He feels like he has a lot of stool right at the verge of his anus but is not able to get it out.  He is very uncomfortable and came to the ED for help.  He is not having abdominal pain.  Is not have any nausea or vomiting. Patient was prescription for hydrocodone on August 2.   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   Sinus bradycardia    Acute upper GI bleed 02/05/2020   Calculus of gallbladder without cholecystitis without obstruction    Lung nodule < 6cm on CT 07/05/2019   Tobacco use disorder 07/05/2019   Loss of weight 03/05/2019   Anemia 03/05/2019   Abnormal CT of the abdomen 03/05/2019   E coli Pyelonephritis 01/16/2019    Past Surgical History:  Procedure Laterality Date   BILIARY DILATION  10/30/2019   Procedure: BILIARY DILATION;  Surgeon: Milus Banister, MD;  Location: [facility];  Service: Endoscopy;;   BILIARY DILATION  02/05/2020   Procedure: BILIARY DILATION;  Surgeon: Milus Banister, MD;  Location: [facility];  Service: Endoscopy;;   BILIARY STENT PLACEMENT N/A 10/30/2019   Procedure: BILIARY STENT PLACEMENT;  Surgeon: Milus Banister, MD;  [facility];  Service: Endoscopy;  Laterality: N/A;   BILIARY STENT PLACEMENT N/A 02/05/2020   Procedure: BILIARY STENT PLACEMENT;  Surgeon: Milus Banister, MD;  [facility];  Service: Endoscopy;  Laterality: N/A;   BIOPSY  03/06/2019   Procedure: BIOPSY;  Surgeon: Daneil Dolin, MD;  [facility];  Service: Endoscopy;;  gastric   BIOPSY  02/05/2020   Procedure: BIOPSY;  Surgeon: Milus Banister, MD;  [facility];  Service: Endoscopy;;  bile buct brushing   CHOLECYSTECTOMY N/A 09/05/2019   Procedure: LAPAROSCOPIC CHOLECYSTECTOMY;  Surgeon: Aviva Signs, MD;  [facility]: [facility];  Service: General;  Laterality: N/A;   COLONOSCOPY WITH PROPOFOL N/A 03/06/2019   Procedure: COLONOSCOPY WITH PROPOFOL;  Surgeon: Daneil Dolin, MD;  [facility];  Service: Endoscopy;  Laterality: N/A;  9:45am - ok at 10:00 per Melanie   ENDOSCOPIC RETROGRADE CHOLANGIOPANCREATOGRAPHY (ERCP) WITH PROPOFOL N/A 10/30/2019   Procedure: ENDOSCOPIC RETROGRADE CHOLANGIOPANCREATOGRAPHY (ERCP) WITH PROPOFOL;  Surgeon: Milus Banister, MD;  [facility];  Service: Endoscopy;  Laterality: N/A;   ENDOSCOPIC RETROGRADE CHOLANGIOPANCREATOGRAPHY (ERCP) WITH PROPOFOL N/A 02/05/2020   Procedure: ENDOSCOPIC RETROGRADE CHOLANGIOPANCREATOGRAPHY (ERCP) WITH PROPOFOL;  Surgeon: Milus Banister, MD;  [facility];  Service: Endoscopy;  Laterality: N/A;   ESOPHAGOGASTRODUODENOSCOPY (EGD) WITH PROPOFOL N/A 03/06/2019   Procedure: ESOPHAGOGASTRODUODENOSCOPY (EGD) WITH PROPOFOL;  Surgeon: Daneil Dolin, MD;  [facility];  Service: Endoscopy;  Laterality: N/A;   ESOPHAGOGASTRODUODENOSCOPY (EGD) WITH PROPOFOL N/A 08/21/2019   Procedure: ESOPHAGOGASTRODUODENOSCOPY (EGD) WITH PROPOFOL;  Surgeon: Milus Banister, MD;  [facility];  Service: Endoscopy;  Laterality: N/A;   EUS N/A 08/21/2019   Procedure:  UPPER ENDOSCOPIC ULTRASOUND (EUS) RADIAL;  Surgeon: Milus Banister, MD;  [facility];  Service: Endoscopy;  Laterality: N/A;   PANCREAS SURGERY     REMOVAL OF STONES  10/30/2019   Procedure: REMOVAL OF STONES;  Surgeon: Milus Banister, MD;  [facility];  Service: Endoscopy;;   REMOVAL OF STONES  02/05/2020   Procedure: REMOVAL OF STONES;   Surgeon: Milus Banister, MD;  [facility];  Service: Endoscopy;;   SPHINCTEROTOMY  10/30/2019   Procedure: [name];  Surgeon: Milus Banister, MD;  Location: [facility];  Service: Endoscopy;;   SPHINCTEROTOMY  02/05/2020   Procedure: [name];  Surgeon: Milus Banister, MD;  [facility];  Service: Endoscopy;;   SPYGLASS CHOLANGIOSCOPY N/A 02/05/2020   Procedure: [id number] CHOLANGIOSCOPY;  Surgeon: Milus Banister, MD;  [facility];  Service: Endoscopy;  Laterality: N/A;   STENT REMOVAL  02/05/2020   Procedure: STENT REMOVAL;  Surgeon: Milus Banister, MD;  [facility];  Service: Endoscopy;;       Family History  Problem Relation Age of Onset   Heart disease Father    Colon cancer Neg Hx    Gastric cancer Neg Hx    Esophageal cancer Neg Hx     Social History   Tobacco Use   Smoking status: Former    Packs/day: 0.00    Years: 50.00    Pack years: 0.00    Types: Cigarettes    Start date: 05/24/1967    Quit date: 06/2020    Years since quitting: 0.4   Smokeless tobacco: Never  Vaping Use   Vaping Use: Never used  Substance Use Topics   Alcohol use: Not Currently   Drug use: Not Currently    Types: Marijuana    Home Medications Prior to Admission medications   Medication Sig Start Date End Date Taking? Authorizing Provider  fluconazole (DIFLUCAN) 100 MG tablet Take 2 tablets today, then 1 tablet daily x 20 more days. 09/27/20   Eppie Gibson, MD  HYDROcodone-acetaminophen (HYCET) 7.5-325 mg/15 ml solution Take 10-15 mLs by mouth every 4 (four) hours as needed for moderate pain. 11/23/20   Gery Pray, MD  lidocaine (XYLOCAINE) 2 % solution Patient: Mix 1part 2% viscous lidocaine, 1part H20. Swish & swallow 50m of diluted mixture, 312m before meals and at bedtime, up to QID 10/11/20   SqEppie GibsonMD  scopolamine (TRANSDERM-SCOP) 1 MG/3DAYS Place 1 patch (1.5 mg total) onto the skin every 3 (three) days. 09/27/20    SqEppie GibsonMD  senna (SENOKOT) 8.6 MG TABS tablet Take 1 tablet (8.6 mg total) by mouth at bedtime as needed for mild constipation. 09/27/20   SqEppie GibsonMD    Allergies    Patient has no known allergies.  Review of Systems   Review of Systems  Gastrointestinal:  Positive for constipation.  All other systems reviewed and are negative.  Physical Exam Updated Vital Signs BP 129/84   Pulse (!) 58   Temp 98.6 F (37 C) (Oral)   Resp 16   Ht 1.778 m ('5\' 10"'$ )   Wt 55.3 kg   SpO2 100%   BMI 17.51 kg/m   Physical Exam Vitals and nursing note reviewed.  Constitutional:      General: He is not in acute distress.    Appearance: He is well-developed.     Comments: Thin  HENT:     Head: Normocephalic and atraumatic.     Right Ear: External ear  normal.     Left Ear: External ear normal.  Eyes:     General: No scleral icterus.       Right eye: No discharge.        Left eye: No discharge.     Conjunctiva/sclera: Conjunctivae normal.  Neck:     Trachea: No tracheal deviation.  Cardiovascular:     Rate and Rhythm: Normal rate and regular rhythm.  Pulmonary:     Effort: Pulmonary effort is normal. No respiratory distress.     Breath sounds: Normal breath sounds. No stridor. No wheezing or rales.  Abdominal:     General: Bowel sounds are normal. There is no distension.     Palpations: Abdomen is soft.     Tenderness: There is no abdominal tenderness. There is no guarding or rebound.  Genitourinary:    Comments: No rectal mass appreciated, large fecal impaction of firm stool noted on rectal exam in the rectal vault Musculoskeletal:        General: No tenderness or deformity.     Cervical back: Neck supple.  Skin:    General: Skin is warm and dry.     Findings: No rash.  Neurological:     General: No focal deficit present.     Mental Status: He is alert.     Cranial Nerves: No cranial nerve deficit (no facial droop, extraocular movements intact, no slurred speech).      Sensory: No sensory deficit.     Motor: No abnormal muscle tone or seizure activity.     Coordination: Coordination normal.  Psychiatric:        Mood and Affect: Mood normal.    ED Results / Procedures / Treatments   Labs (all labs ordered are listed, but only abnormal results are displayed) Labs Reviewed - No data to display  EKG None  Radiology No results found.  Procedures Fecal disimpaction  Date/Time: 11/29/2020 6:37 PM Performed by: Dorie Rank, MD Authorized by: Dorie Rank, MD  Consent: Verbal consent obtained. Risks and benefits: risks, benefits and alternatives were discussed Consent given by: patient Local anesthesia used: no  Anesthesia: Local anesthesia used: no  Sedation: Patient sedated: no  Patient tolerance: patient tolerated the procedure well with no immediate complications Comments: Small amount of stool manually disimpacted.  Patient was only able to tolerate any further disimpaction due to the discomfort     Medications Ordered in ED Medications  sorbitol, milk of mag, mineral oil, glycerin (SMOG) enema (960 mLs Rectal Given 11/29/20 1917)    ED Course  I have reviewed the triage vital signs and the nursing notes.  Pertinent labs & imaging results that were available during my care of the patient were reviewed by me and considered in my medical decision making (see chart for details).    MDM Rules/Calculators/A&P                           Patient presented with complaints of constipation.  He did have evidence of fecal impaction on exam.  Patient without abdominal pain.  No signs to suggest colitis, diverticulitis or obstruction.  I manually disimpacted the patient.  He then was given a smog enema.  Patient is feeling much better at this point.  He has had good results with enema.  He feels ready for discharge. Final Clinical Impression(s) / ED Diagnoses Final diagnoses:  Constipation, unspecified constipation type    Rx / DC Orders ED  Discharge  Orders     None        Dorie Rank, MD 11/29/20 2109

## 2020-11-29 NOTE — ED Triage Notes (Signed)
Pt states he has not had a BM x 4  days; pt states he usually has one daily and states he has been using miralax with no results; pt states he feels it at "the end, but won't come out"

## 2020-11-29 NOTE — Discharge Instructions (Addendum)
Make sure to take the miralax daily to help prevent future bouts of severe constipation.  Follow up with your primary care doctor

## 2020-11-30 ENCOUNTER — Telehealth: Payer: Self-pay

## 2020-11-30 NOTE — Telephone Encounter (Signed)
Transition Care Management Unsuccessful Follow-up Telephone Call  Date of discharge and from where:  11/29/2020-[facility] ED  Attempts:  1st Attempt  Reason for unsuccessful TCM follow-up call:  Unable to reach patient

## 2020-12-01 NOTE — Telephone Encounter (Signed)
Transition Care Management Unsuccessful Follow-up Telephone Call  Date of discharge and from where:  11/29/2020 from [facility]  Attempts:  2nd Attempt  Reason for unsuccessful TCM follow-up call:  Left voice message  Left message with pt sister to call back.

## 2020-12-02 NOTE — Telephone Encounter (Signed)
Transition Care Management Follow-up Telephone Call Date of discharge and from where: 11/29/2020-[facility] How have you been since you were released from the hospital? Patient is doing fine.  Any questions or concerns? No  Items Reviewed: Did the pt receive and understand the discharge instructions provided? Yes  Medications obtained and verified?  No medications given at discharge Other? No  Any new allergies since your discharge? No  Dietary orders reviewed? N/A Do you have support at home? Yes   Home Care and Equipment/Supplies: Were home health services ordered? not applicable If so, what is the name of the agency? N/A  Has the agency set up a time to come to the patient's home? not applicable Were any new equipment or medical supplies ordered?  No What is the name of the medical supply agency? N/A Were you able to get the supplies/equipment? not applicable Do you have any questions related to the use of the equipment or supplies? No  Functional Questionnaire: (I = Independent and D = Dependent) ADLs: I  Bathing/Dressing- I  Meal Prep- I  Eating- I  Maintaining continence- I  Transferring/Ambulation- I  Managing Meds- I  Follow up appointments reviewed:  PCP Hospital f/u appt confirmed? No   Specialist Hospital f/u appt confirmed? No   Are transportation arrangements needed? No  If their condition worsens, is the pt aware to call PCP or go to the Emergency Dept.? Yes Was the patient provided with contact information for the PCP's office or ED? Yes Was to pt encouraged to call back with questions or concerns? Yes

## 2020-12-16 DIAGNOSIS — C041 Malignant neoplasm of lateral floor of mouth: Secondary | ICD-10-CM | POA: Diagnosis not present

## 2020-12-17 NOTE — Progress Notes (Signed)
  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   Plan: The patient will follow-up with radiation oncology in 2-3weeks.  -----------------------------------  Eppie Gibson, MD

## 2020-12-20 DIAGNOSIS — C049 Malignant neoplasm of floor of mouth, unspecified: Secondary | ICD-10-CM | POA: Diagnosis not present

## 2020-12-21 ENCOUNTER — Other Ambulatory Visit: Payer: Self-pay

## 2020-12-21 ENCOUNTER — Inpatient Hospital Stay: Payer: Medicaid Other | Admitting: Nutrition

## 2020-12-21 NOTE — Progress Notes (Signed)
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  He no longer has a feeding tube.  Weight decreased and documented as 119 pounds August 30 down from 122 pounds August 8. There are no new labs. Patient states he is eating better and has started to grind or pure his foods.  He is able to eat chicken, mixed vegetables, corn, meatloaf, noodles of noodles with hot dog ground-up.  He drinks orange juice, soda, and occasionally milk.  He is consuming boost plus, 3 cartons daily.  He is adding coconut oil to foods when indicated.  Estimated nutrition needs: 1960-2240 cal, 90-101 g protein, 2 L fluid.  Nutrition diagnosis: Unintended weight loss continues.  Intervention: Patient educated to continue strategies for pured/ground foods with extra liquids/moisture as needed.   Increase boost plus 4 times daily. Continue strategies for adding extra calories and protein. Questions were answered.  Teach back method used.  Monitoring, evaluation, goals: Patient will work to increase calories and protein to minimize further weight loss.  Next visit: Tuesday, September 27 after MD visit.  **Disclaimer: This note was dictated with voice recognition software. Similar sounding words can inadvertently be transcribed and this note may contain transcription errors which may not have been corrected upon publication of note.**

## 2020-12-23 ENCOUNTER — Encounter: Payer: Medicaid Other | Admitting: Nutrition

## 2020-12-24 ENCOUNTER — Ambulatory Visit: Payer: Medicaid Other | Attending: Radiation Oncology

## 2020-12-24 ENCOUNTER — Other Ambulatory Visit: Payer: Self-pay

## 2020-12-24 DIAGNOSIS — R471 Dysarthria and anarthria: Secondary | ICD-10-CM | POA: Diagnosis present

## 2020-12-24 DIAGNOSIS — R1312 Dysphagia, oropharyngeal phase: Secondary | ICD-10-CM | POA: Diagnosis present

## 2020-12-24 NOTE — Therapy (Signed)
[facility] [address] [place], [place], [address] Phone: [phone]   Fax:  [phone]  Speech Language Pathology Treatment/Renewal Summary  Patient Details  Name: [name] MRN: [id number] Date of Birth: August 17, 1955 Referring Provider (SLP): Eppie Gibson, MD   Encounter Date: 12/24/2020   End of Session - 12/24/20 1529     Visit Number 4    Number of Visits 7    Date for SLP Re-Evaluation 03/24/21    SLP Start Time [id number]    SLP Stop Time  1522    SLP Time Calculation (min) 26 min    Activity Tolerance Patient tolerated treatment well             Past Medical History:  Diagnosis Date   Known health problems: none     Past Surgical History:  Procedure Laterality Date   BILIARY DILATION  10/30/2019   Procedure: BILIARY DILATION;  Surgeon: Milus Banister, MD;  Location: [facility];  Service: Endoscopy;;   BILIARY DILATION  02/05/2020   Procedure: BILIARY DILATION;  Surgeon: Milus Banister, MD;  Location: [facility];  Service: Endoscopy;;   BILIARY STENT PLACEMENT N/A 10/30/2019   Procedure: BILIARY STENT PLACEMENT;  Surgeon: Milus Banister, MD;  Location: [facility];  Service: Endoscopy;  Laterality: N/A;   BILIARY STENT PLACEMENT N/A 02/05/2020   Procedure: BILIARY STENT PLACEMENT;  Surgeon: Milus Banister, MD;  Location: [facility];  Service: Endoscopy;  Laterality: N/A;   BIOPSY  03/06/2019   Procedure: BIOPSY;  Surgeon: Daneil Dolin, MD;  [facility];  Service: Endoscopy;;  gastric   BIOPSY  02/05/2020   Procedure: BIOPSY;  Surgeon: Milus Banister, MD;  [facility]: [facility];  Service: Endoscopy;;  bile buct brushing   CHOLECYSTECTOMY N/A 09/05/2019   Procedure: LAPAROSCOPIC CHOLECYSTECTOMY;  Surgeon: Aviva Signs, MD;  Location: [facility];  Service: General;  Laterality: N/A;   COLONOSCOPY WITH PROPOFOL N/A 03/06/2019   Procedure: COLONOSCOPY WITH PROPOFOL;  Surgeon:  Daneil Dolin, MD;  [facility];  Service: Endoscopy;  Laterality: N/A;  9:45am - ok at 10:00 per Melanie   ENDOSCOPIC RETROGRADE CHOLANGIOPANCREATOGRAPHY (ERCP) WITH PROPOFOL N/A 10/30/2019   Procedure: ENDOSCOPIC RETROGRADE CHOLANGIOPANCREATOGRAPHY (ERCP) WITH PROPOFOL;  Surgeon: Milus Banister, MD;  [facility];  Service: Endoscopy;  Laterality: N/A;   ENDOSCOPIC RETROGRADE CHOLANGIOPANCREATOGRAPHY (ERCP) WITH PROPOFOL N/A 02/05/2020   Procedure: ENDOSCOPIC RETROGRADE CHOLANGIOPANCREATOGRAPHY (ERCP) WITH PROPOFOL;  Surgeon: Milus Banister, MD;  [facility];  Service: Endoscopy;  Laterality: N/A;   ESOPHAGOGASTRODUODENOSCOPY (EGD) WITH PROPOFOL N/A 03/06/2019   Procedure: ESOPHAGOGASTRODUODENOSCOPY (EGD) WITH PROPOFOL;  Surgeon: Daneil Dolin, MD;  [facility];  Service: Endoscopy;  Laterality: N/A;   ESOPHAGOGASTRODUODENOSCOPY (EGD) WITH PROPOFOL N/A 08/21/2019   Procedure: ESOPHAGOGASTRODUODENOSCOPY (EGD) WITH PROPOFOL;  Surgeon: Milus Banister, MD;  [facility];  Service: Endoscopy;  Laterality: N/A;   EUS N/A 08/21/2019   Procedure: UPPER ENDOSCOPIC ULTRASOUND (EUS) RADIAL;  Surgeon: Milus Banister, MD;  [facility]: [facility];  Service: Endoscopy;  Laterality: N/A;   PANCREAS SURGERY     REMOVAL OF STONES  10/30/2019   Procedure: REMOVAL OF STONES;  Surgeon: Milus Banister, MD;  Location: [facility];  Service: Endoscopy;;   REMOVAL OF STONES  02/05/2020   Procedure: REMOVAL OF STONES;  Surgeon: Milus Banister, MD;  Location: [facility];  Service: Endoscopy;;   SPHINCTEROTOMY  10/30/2019   Procedure: SPHINCTEROTOMY;  Surgeon: Milus Banister, MD;  Location: [facility];  Service: Endoscopy;;   SPHINCTEROTOMY  02/05/2020   Procedure: [name];  Surgeon: Milus Banister, MD;  Location: [facility];  Service: Endoscopy;;   [id number] CHOLANGIOSCOPY N/A 02/05/2020   Procedure: [id number] CHOLANGIOSCOPY;  Surgeon:  Milus Banister, MD;  [facility];  Service: Endoscopy;  Laterality: N/A;   STENT REMOVAL  02/05/2020   Procedure: STENT REMOVAL;  Surgeon: Milus Banister, MD;  [facility]: [facility];  Service: Endoscopy;;    There were no vitals filed for this visit.   Subjective Assessment - 12/24/20 1500     Subjective "I had Oodles of Noodles and bacon ground up this morning."    Currently in Pain? No/denies                   ADULT SLP TREATMENT - 12/24/20 1501       General Information   Behavior/Cognition Alert;Cooperative;Pleasant mood      Treatment Provided   Treatment provided Dysphagia      Dysphagia Treatment   Temperature Spikes Noted No    Respiratory Status Room air    Oral Cavity - Dentition Missing dentition    Treatment Methods Skilled observation;Therapeutic exercise;Compensation strategy training;Patient/caregiver education    Patient observed directly with PO's Yes    Type of PO's observed Thin liquids;Dysphagia 1 (puree)    Pharyngeal Phase Signs & Symptoms Multiple swallows    Other treatment/comments Pt arrives late today due to mix up with transportation. He states he has been eating some of the diet suggestions from SLP previous session (grits with cheese and minced sausage). He completes HEP with independence today, and states he has been adhering to frequency and scope of HEP since last visit, except last three days due to mouth pain from surgeon appt on Tuesday.Pt states he is going to stay with soft, mushy foods currently - SLP affirmed this -dys II diet (minced meats). He told SLP when he should decr frequency (at [facility] Years, down to x2/week after that time).      Assessment / Recommendations / Plan   Plan --   decr to every other month due to progress iwth HEP and safe with POs     Dysphagia Recommendations   Diet recommendations Thin liquid;Dysphagia 2 (fine chop)      Progression Toward Goals   Progression toward goals  Progressing toward goals              SLP Education - 12/24/20 1528     Education Details stay with dys II diet-type foods/thin liquids, work on [employer] every day until [employer] Years then decr to x2/week    [employer]) Educated Patient    Methods Explanation    Comprehension Verbalized understanding              SLP Short Term Goals - 11/22/20 1557       SLP SHORT TERM GOAL #1   Title pt will complete HEP with rare min A    Period --   sessions, for all STGs   Status Achieved      SLP SHORT TERM GOAL #2   Title pt will tell SLP why pt is completing HEP with modified independence    Status Achieved      SLP SHORT TERM GOAL #3   Title pt will describe 3 overt s/s aspiration PNA with modified independence    Status Achieved      SLP SHORT TERM GOAL #  4   Title pt will tell SLP how a food journal could hasten return to a more normalized diet    Status Achieved              SLP Long Term Goals - 12/24/20 1513       SLP LONG TERM GOAL #1   Title pt will complete HEP with modified independence over two visits    Baseline 12-24-20    Time 2    Period --   visits, for all LTGs   Status On-going      SLP LONG TERM GOAL #2   Title pt will describe how to modify HEP over time, and the timeline associated with reduction in HEP frequency with modified independence over two sessions    Time 2    Status On-going              Plan - 12/24/20 1530     Clinical Impression Statement At this time pt swallowing is deemed WFL/WNL with dys I-II items and thin liquids. SLP reviewed pt's individualized HEP for oral and pharyngeal dysphagia and pt completed each exercise on their own with independence. There are no overt s/s aspiration PNA reported by pt at this time. Data indicate that pt's swallow ability will likely decrease over the course of radiation therapy and could very well decline over time following conclusion of their radiation therapy due to muscle disuse atrophy  and/or muscle fibrosis. See "skilled intervention" for more details about today's session. Pt will cont to need to be seen by SLP in order to assess safety of PO intake, assess the need for recommending any objective swallow assessment, and ensuring pt correctly completes the individualized HEP. Pt agreed ST once every 8 weeks seems apprpriate given his success after today's session.    Speech Therapy Frequency --   once approx every 8 weeks   Duration --   3 visits/90 days   Treatment/Interventions Aspiration precaution training;Pharyngeal strengthening exercises;Diet toleration management by SLP;Trials of upgraded texture/liquids;Patient/family education;SLP instruction and feedback;Compensatory techniques    Potential to Achieve Goals Good    Potential Considerations Severity of impairments    SLP Home Exercise Plan provided today    Consulted and Agree with Plan of Care Patient             Patient will benefit from skilled therapeutic intervention in order to improve the following deficits and impairments:   Dysarthria and anarthria  Oropharyngeal dysphagia    Problem List Patient Active Problem List   Diagnosis Date Noted   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

## 2021-01-03 ENCOUNTER — Telehealth: Payer: Self-pay | Admitting: *Deleted

## 2021-01-03 NOTE — Telephone Encounter (Signed)
CALLED PATIENT TO INFORM OF STAT LABS ON 01-17-21 @ [facility] AND HIS CT TO FOLLOW ON 01-17-21- ARRIVAL TIME - 8:45 AM @ [facility], PATIENT TO BE NPO- 4 HRS. PRIOR TO TEST, PATIENT TO PICK UP CONTRAST FOR ABDOMEN ON 01-14-21 FOR TEST ON 01-17-21, PATIENT TO RECEIVE RESULTS FROM DR. SQUIRE ON 01-18-21 @ 2:40 PM, LVM FOR A RETURN CALL

## 2021-01-17 ENCOUNTER — Ambulatory Visit
Admission: RE | Admit: 2021-01-17 | Discharge: 2021-01-17 | Disposition: A | Discharge status: Home / Self Care | Payer: Medicaid Other | Source: Ambulatory Visit | Attending: Radiation Oncology | Admitting: Radiation Oncology

## 2021-01-17 ENCOUNTER — Other Ambulatory Visit: Payer: Self-pay

## 2021-01-17 ENCOUNTER — Ambulatory Visit (HOSPITAL_COMMUNITY)
Admission: RE | Admit: 2021-01-17 | Discharge: 2021-01-17 | Disposition: A | Discharge status: Home / Self Care | Payer: Medicaid Other | Source: Ambulatory Visit | Attending: Radiation Oncology | Admitting: Radiation Oncology

## 2021-01-17 ENCOUNTER — Encounter (HOSPITAL_COMMUNITY): Payer: Self-pay

## 2021-01-17 ENCOUNTER — Ambulatory Visit (HOSPITAL_COMMUNITY): Payer: Medicaid Other

## 2021-01-17 DIAGNOSIS — C049 Malignant neoplasm of floor of mouth, unspecified: Secondary | ICD-10-CM

## 2021-01-17 DIAGNOSIS — I700 Atherosclerosis of aorta: Secondary | ICD-10-CM | POA: Diagnosis not present

## 2021-01-17 DIAGNOSIS — M47812 Spondylosis without myelopathy or radiculopathy, cervical region: Secondary | ICD-10-CM | POA: Diagnosis not present

## 2021-01-17 DIAGNOSIS — J439 Emphysema, unspecified: Secondary | ICD-10-CM | POA: Diagnosis not present

## 2021-01-17 DIAGNOSIS — J384 Edema of larynx: Secondary | ICD-10-CM | POA: Diagnosis not present

## 2021-01-17 DIAGNOSIS — C760 Malignant neoplasm of head, face and neck: Secondary | ICD-10-CM | POA: Diagnosis not present

## 2021-01-17 DIAGNOSIS — J392 Other diseases of pharynx: Secondary | ICD-10-CM | POA: Diagnosis not present

## 2021-01-17 LAB — CMP (CANCER CENTER ONLY)
ALT: 21 U/L (ref 0–44)
AST: 28 U/L (ref 15–41)
Albumin: 3.2 g/dL — ABNORMAL LOW (ref 3.5–5.0)
Alkaline Phosphatase: 80 U/L (ref 38–126)
Anion gap: 12 (ref 5–15)
BUN: 6 mg/dL — ABNORMAL LOW (ref 8–23)
CO2: 22 mmol/L (ref 22–32)
Calcium: 9.2 mg/dL (ref 8.9–10.3)
Chloride: 107 mmol/L (ref 98–111)
Creatinine: 0.81 mg/dL (ref 0.61–1.24)
GFR, Estimated: >60 mL/min (ref >60)
Glucose, Bld: 164 mg/dL — ABNORMAL HIGH (ref 70–99)
Potassium: 3.2 mmol/L — ABNORMAL LOW (ref 3.5–5.1)
Sodium: 141 mmol/L (ref 135–145)
Total Bilirubin: 0.3 mg/dL (ref 0.3–1.2)
Total Protein: 7.3 g/dL (ref 6.5–8.1)

## 2021-01-17 MED ORDER — IOHEXOL 350 MG/ML SOLN
75.0000 mL | Freq: Once | INTRAVENOUS | Status: AC | PRN
  Administered 2021-01-17: 75 mL via INTRAVENOUS

## 2021-01-18 ENCOUNTER — Other Ambulatory Visit: Payer: Self-pay

## 2021-01-18 ENCOUNTER — Ambulatory Visit
Admission: RE | Admit: 2021-01-18 | Discharge: 2021-01-18 | Disposition: A | Discharge status: Home / Self Care | Payer: Medicaid Other | Source: Ambulatory Visit | Attending: Radiation Oncology | Admitting: Radiation Oncology

## 2021-01-18 ENCOUNTER — Inpatient Hospital Stay: Payer: Medicaid Other | Attending: Dietician | Admitting: Nutrition

## 2021-01-18 VITALS — BP 91/71 | HR 79 | Temp 97.0°F | Resp 18 | Wt 122.4 lb

## 2021-01-18 DIAGNOSIS — K861 Other chronic pancreatitis: Secondary | ICD-10-CM | POA: Insufficient documentation

## 2021-01-18 DIAGNOSIS — E876 Hypokalemia: Secondary | ICD-10-CM | POA: Insufficient documentation

## 2021-01-18 DIAGNOSIS — Z85818 Personal history of malignant neoplasm of other sites of lip, oral cavity, and pharynx: Secondary | ICD-10-CM | POA: Diagnosis present

## 2021-01-18 DIAGNOSIS — R682 Dry mouth, unspecified: Secondary | ICD-10-CM | POA: Diagnosis not present

## 2021-01-18 DIAGNOSIS — J432 Centrilobular emphysema: Secondary | ICD-10-CM | POA: Diagnosis not present

## 2021-01-18 DIAGNOSIS — Z79899 Other long term (current) drug therapy: Secondary | ICD-10-CM | POA: Diagnosis not present

## 2021-01-18 DIAGNOSIS — I700 Atherosclerosis of aorta: Secondary | ICD-10-CM | POA: Insufficient documentation

## 2021-01-18 DIAGNOSIS — Z923 Personal history of irradiation: Secondary | ICD-10-CM | POA: Insufficient documentation

## 2021-01-18 DIAGNOSIS — M47812 Spondylosis without myelopathy or radiculopathy, cervical region: Secondary | ICD-10-CM | POA: Insufficient documentation

## 2021-01-18 DIAGNOSIS — C048 Malignant neoplasm of overlapping sites of floor of mouth: Secondary | ICD-10-CM

## 2021-01-18 NOTE — Progress Notes (Signed)
Brief nutrition follow-up completed with patient after MD visit for floor of mouth cancer. Patient is eating well by mouth but still is not able to chew solid foods.  Reports he is going to the dentist soon.  He does continue to pure or grind up food so that he can eat better variety. He has increase boost plus to 4 cartons daily as recommended at last visit. Continues to add coconut oil to foods when indicated. Weight improved and documented as 122.4 pounds on September 27 increased from 119 pounds August 30.  Estimated nutrition needs: 1960-2240 cal, 90-101 g protein, 2 L fluid.  Nutrition diagnosis: Unintended weight loss improved.  Intervention: Continue boost +4 times daily. Advance diet once he is able to chew and swallow with ease. Continue to focus on weight gain with adequate calories and protein.  Monitoring, evaluation, goals: Patient will continue to increase calories and protein to promote weight gain.  No follow-up scheduled.  Patient understands to call RD with questions or concerns.  **Disclaimer: This note was dictated with voice recognition software. Similar sounding words can inadvertently be transcribed and this note may contain transcription errors which may not have been corrected upon publication of note.**

## 2021-01-18 NOTE — Progress Notes (Signed)
Tanner Thomas presents today for follow-up after completing radiation to his floor of mouth on 10/21/2020 and to review CT scan results from 01/17/21  Pain issues, if any: Patient denies Using a feeding tube?: N/A Weight changes, if any:  Wt Readings from Last 3 Encounters:  01/18/21 122 lb 6.4 oz (55.5 kg)  12/21/20 119 lb (54 kg)  11/29/20 122 lb (55.3 kg)   Swallowing issues, if any: Patient denies. Does admit that he still mainly sticks with soft foods/liquids, but he denies any issues or concerns Smoking or chewing tobacco? None Using fluoride trays daily? N/A--denies any new dental concerns Last ENT visit was on: 12/20/2020 Saw Dr. Fenton Malling: "Impression & Plans:  Tanner Thomas is a 65 y.o. male who presents for follow-up of floor of mouth SCC. 06/29/20 patient underwent tracheotomy; partial glossectomy and rim mandibulectomy; dental extractions; bilateral selective neck dissection, zones 1 through 3; right platysma myocutaneous flap for floor of mouth reconstruction; feeding tube placement. He underwent mandibular irrigation and debridement due to initially poorly healing tissue. He is now sp radiation therapy 10/21/20.   He does have two areas of ulceration within his left floor of mouth, although these areas may be post-radiation changes. We will plan to follow these up in the next few months to ensure resolution.   - 1-2 month follow up"  Other notable issues, if any: Denies any ear or jaw pain, or difficulty opening his mouth fully. Reports dry mouth and thick saliva have resolved. Denies new or worsening signs of lymphedema to his chin/neck. Denies lingering fatigue or difficulty sleeping. Overall, he reports he feels well and is pleased with his progress thus far

## 2021-01-19 NOTE — Progress Notes (Addendum)
Oncology Nurse Navigator Documentation   I met with Tanner Thomas during his post treatment follow up with Dr. Isidore Moos. He is doing well after completing radiation treatment. We have been attempting to call him to set up a dentist appointment with Dr. Benson Norway and he agreed today to see her on 9/30 at 11:00. He continues to follow up with Swallowing Therapy and will see his ENT at [facility] on 02/07/21. He will also follow up with nutrition today. He will see Dr. Isidore Moos again in January 2023. He knows to call me if he has any needs or questions.   Harlow Asa RN, BSN, OCN Head & Neck Oncology Nurse [place] [facility] Phone # [phone]  Fax # [phone]

## 2021-01-19 NOTE — Progress Notes (Signed)
Radiation Oncology         [phone] ________________________________  Name: Tanner Thomas MRN: [id number]  Date: 01/18/2021  DOB: 1955/08/21  Follow-Up Visit Note  CC: Pcp, No  Lorayne Bender, MD  Diagnosis and Prior Radiotherapy:       ICD-10-CM   1. 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 Hypokalemia  E87.6 Ambulatory referral to Internal Medicine    3. Chronic pancreatitis, unspecified pancreatitis type ([place])  K86.1 Ambulatory referral to Internal Medicine      CHIEF COMPLAINT:  Here for follow-up and surveillance of oral cancer  Narrative:   Tanner Thomas presents today for follow-up after completing radiation to his floor of mouth on 10/21/2020 and to review CT scan results from 01/17/21  Pain issues, if any: Patient denies Using a feeding tube?: N/A Weight changes, if any:  Wt Readings from Last 3 Encounters:  01/18/21 122 lb 6.4 oz (55.5 kg)  12/21/20 119 lb (54 kg)  11/29/20 122 lb (55.3 kg)   Swallowing issues, if any: Patient denies. Does admit that he still mainly sticks with soft foods/liquids, but he denies any issues or concerns Smoking or chewing tobacco? None Using fluoride trays daily? N/A--denies any new dental concerns Last ENT visit was on: 12/20/2020 Saw Dr. Fenton Malling: "Impression & Plans:  Tanner Thomas is a 64 y.o. male who presents for follow-up of floor of mouth SCC. 06/29/20 patient underwent tracheotomy; partial glossectomy and rim mandibulectomy; dental extractions; bilateral selective neck dissection, zones 1 through 3; right platysma myocutaneous flap for floor of mouth reconstruction; feeding tube placement. He underwent mandibular irrigation and debridement due to initially poorly healing tissue. He is now sp radiation therapy 10/21/20.   He does have two areas of ulceration within his left floor of mouth, although these areas may be post-radiation changes. We will plan to  follow these up in the next few months to ensure resolution.   - 1-2 month follow up"  Other notable issues, if any: Denies any ear or jaw pain, or difficulty opening his mouth fully. Reports dry mouth and thick saliva have resolved. Denies new or worsening signs of lymphedema to his chin/neck. Denies lingering fatigue or difficulty sleeping. Overall, he reports he feels well and is pleased with his progress thus far                 ALLERGIES:  has No Known Allergies.  Meds: Current Outpatient Medications  Medication Sig Dispense Refill   fluconazole (DIFLUCAN) 100 MG tablet Take 2 tablets today, then 1 tablet daily x 20 more days. (Patient not taking: Reported on 01/18/2021) 22 tablet 0   HYDROcodone-acetaminophen (HYCET) 7.5-325 mg/15 ml solution Take 10-15 mLs by mouth every 4 (four) hours as needed for moderate pain. (Patient not taking: Reported on 01/18/2021) 473 mL 0   lidocaine (XYLOCAINE) 2 % solution Patient: Mix 1part 2% viscous lidocaine, 1part H20. Swish & swallow 70mL of diluted mixture, 20min before meals and at bedtime, up to QID (Patient not taking: Reported on 01/18/2021) 300 mL 2   scopolamine (TRANSDERM-SCOP) 1 MG/3DAYS Place 1 patch (1.5 mg total) onto the skin every 3 (three) days. (Patient not taking: Reported on 01/18/2021) 10 patch 4   senna (SENOKOT) 8.6 MG TABS tablet Take 1 tablet (8.6 mg total) by mouth at bedtime as needed for mild constipation. (Patient not taking: Reported on 01/18/2021) 30 tablet 1   No  current facility-administered medications for this encounter.    Physical Findings: The patient is in no acute distress. Patient is alert and oriented. Wt Readings from Last 3 Encounters:  01/18/21 122 lb 6.4 oz (55.5 kg)  12/21/20 119 lb (54 kg)  11/29/20 122 lb (55.3 kg)    weight is 122 lb 6.4 oz (55.5 kg). His temperature is 97 F (36.1 C) (abnormal). His blood pressure is 91/71 and his pulse is 79. His respiration is 18 and oxygen saturation is 100%. .   General: Alert and oriented, in no acute distress HEENT: Head is normocephalic. Extraocular movements are intact. Oropharynx /mouth notable for a small hole (~3-79mm) that is open where the tongue tissue meets the floor of mouth - this area has white debris that can be removed with a cotton swab. Neck: Neck is notable for no masses Skin: Skin in treatment fields shows satisfactory healing  Psychiatric: Judgment and insight are intact. Affect is appropriate. Heart RRR Chest CTAB  Lab Findings: Lab Results  Component Value Date   WBC 8.0 02/05/2020   HGB 13.1 02/06/2020   HCT 40.1 02/06/2020   MCV 94.4 02/05/2020   PLT 112 (L) 02/05/2020    Lab Results  Component Value Date   TSH 3.044 02/05/2020    Radiographic Findings: CT Soft Tissue Neck W Contrast  Result Date: 01/18/2021 CLINICAL DATA:  Head/neck cancer, assess treatment response EXAM: CT NECK WITH CONTRAST TECHNIQUE: Multidetector CT imaging of the neck was performed using the standard protocol following the bolus administration of intravenous contrast. CONTRAST:  72mL OMNIPAQUE IOHEXOL 350 MG/ML SOLN COMPARISON:  CT neck 05/05/2020. FINDINGS: Pharynx and larynx: Postoperative changes partial glossectomy and segmental mandibulectomy. Edema and loss of fat planes in this region and within the epiglottis and preepiglottic fat, likely post treatment change related to surgery and radiation. No masslike nodular enhancement. Salivary glands: Resection of bilateral submandibular glands. Unremarkable parotid glands. Thyroid: Normal. Lymph nodes: Status post bilateral nodal dissections. Loss of fat planes without abnormally enlarged lymph nodes. Vascular: Negative. Limited intracranial: Negative. Visualized orbits: Negative. Mastoids and visualized paranasal sinuses: Mild-to-moderate paranasal sinus mucosal thickening with evidence of prior endoscopic sinus surgery. Skeleton: Severe multilevel degenerative change in the cervical spine. Upper  chest: Emphysema and scarring in the visualized lung apices. IMPRESSION: 1. Posttreatment changes related to glossectomy, segmental mandibulectomy and radiation without nodular/masslike enhancement to suggest residual/recurrent malignancy. This is the first postoperative CT neck and will serve as a new baseline for future follow-up. 2. No adenopathy. Electronically Signed   By: Margaretha Sheffield M.D.   On: 01/18/2021 08:58   CT Chest W Contrast  Result Date: 01/18/2021 CLINICAL DATA:  Head/neck cancer, assess treatment response. Diagnosed 2021, radiation therapy complete. EXAM: CT CHEST WITH CONTRAST TECHNIQUE: Multidetector CT imaging of the chest was performed during intravenous contrast administration. CONTRAST:  24mL OMNIPAQUE IOHEXOL 350 MG/ML SOLN COMPARISON:  04/30/2019 chest CT. FINDINGS: Cardiovascular: Normal heart size. No significant pericardial effusion/thickening. Atherosclerotic nonaneurysmal thoracic aorta. Normal caliber pulmonary arteries. No central pulmonary emboli. Mediastinum/Nodes: No discrete thyroid nodules. Unremarkable esophagus. No pathologically enlarged axillary, mediastinal or hilar lymph nodes. Lungs/Pleura: No pneumothorax. No pleural effusion. Moderate centrilobular and paraseptal emphysema with diffuse bronchial wall thickening. Apical left upper lobe 3 mm solid pulmonary nodule (series 6/image 20), stable since 04/30/2019 chest CT, considered benign. No acute consolidative airspace disease, lung masses or new significant pulmonary nodules. Scattered parenchymal bands throughout the right lower lobe are unchanged. Upper abdomen: Partially visualized common bile duct stent  in place. Expected pneumobilia in the left liver. Cholecystectomy. Coarse calcifications throughout the pancreas with irregular mild pancreatic duct dilation, unchanged, compatible with chronic pancreatitis. Musculoskeletal: No aggressive appearing focal osseous lesions. Mild thoracic spondylosis. IMPRESSION:  1. No evidence of metastatic disease in the chest. 2. Moderate emphysema with diffuse bronchial wall thickening, suggesting COPD. 3. Chronic pancreatitis. 4. Aortic Atherosclerosis (ICD10-I70.0) and Emphysema (ICD10-J43.9). Electronically Signed   By: Ilona Sorrel M.D.   On: 01/18/2021 08:26    Impression/Plan:    1) Head and Neck Cancer Status: Favor NED.  I personally reviewed his scans.  He is being monitored by ENT for incomplete healing at the [facility] and will see ENT in Oct. He knows that this appt is important.  Recommend frequent irrigation w/ baking soda rinses and any other interventions rec'd by ENT  2) Nutritional Status: stable - see dietician today PEG tube: none  Wt Readings from Last 3 Encounters:  01/18/21 122 lb 6.4 oz (55.5 kg)  12/21/20 119 lb (54 kg)  11/29/20 122 lb (55.3 kg)    3) Risk Factors: The patient has been educated about risk factors including alcohol and tobacco abuse; they understand that avoidance of alcohol and tobacco is important to prevent recurrences as well as other cancers  4) Swallowing: good function, continue SLP exercises  5) Dental: Encouraged to continue regular followup with dentistry, and dental hygiene including fluoride rinses.    6) Thyroid function: check again at Jan f/u appt Lab Results  Component Value Date   TSH 3.044 02/05/2020   7)  Follow-up in Jan 2023 with me; sees ENT at [facility] in Oct.      The patient was encouraged to call with any issues or questions before then.  On date of service, in total, I spent 20 minutes on this encounter. Patient was seen in person. _____________________________________   Eppie Gibson, MD

## 2021-01-20 ENCOUNTER — Encounter: Payer: Self-pay | Admitting: Radiation Oncology

## 2021-01-20 ENCOUNTER — Telehealth: Payer: Self-pay | Admitting: *Deleted

## 2021-01-20 NOTE — Telephone Encounter (Signed)
Called patient to inform of appt. with Dr. Wende Neighbors on 01-28-21- arrival time- 9:15 am , [facility] [address], [address] [place]. On ground floor- ph. number [phone]

## 2021-01-21 ENCOUNTER — Other Ambulatory Visit: Payer: Self-pay

## 2021-01-21 ENCOUNTER — Encounter (HOSPITAL_COMMUNITY): Payer: Self-pay | Admitting: Dentistry

## 2021-01-21 ENCOUNTER — Ambulatory Visit (INDEPENDENT_AMBULATORY_CARE_PROVIDER_SITE_OTHER): Payer: Medicaid Other | Admitting: Dentistry

## 2021-01-21 VITALS — BP 94/66 | HR 66 | Temp 97.8°F

## 2021-01-21 DIAGNOSIS — R634 Abnormal weight loss: Secondary | ICD-10-CM

## 2021-01-21 DIAGNOSIS — Z0120 Encounter for dental examination and cleaning without abnormal findings: Secondary | ICD-10-CM

## 2021-01-21 DIAGNOSIS — K117 Disturbances of salivary secretion: Secondary | ICD-10-CM

## 2021-01-21 DIAGNOSIS — K08109 Complete loss of teeth, unspecified cause, unspecified class: Secondary | ICD-10-CM

## 2021-01-21 DIAGNOSIS — K036 Deposits [accretions] on teeth: Secondary | ICD-10-CM

## 2021-01-21 DIAGNOSIS — Y842 Radiological procedure and radiotherapy as the cause of abnormal reaction of the patient, or of later complication, without mention of misadventure at the time of the procedure: Secondary | ICD-10-CM

## 2021-01-21 DIAGNOSIS — C048 Malignant neoplasm of overlapping sites of floor of mouth: Secondary | ICD-10-CM | POA: Diagnosis not present

## 2021-01-21 DIAGNOSIS — Z08 Encounter for follow-up examination after completed treatment for malignant neoplasm: Secondary | ICD-10-CM

## 2021-01-21 MED ORDER — SODIUM FLUORIDE 5000 PPM 1.1 % DT PSTE: 1.0000 "application " | PASTE | Freq: Two times a day (BID) | DENTAL | 11 refills | Status: AC

## 2021-01-21 NOTE — Patient Instructions (Signed)
H[place] Benson Norway, D.M.D. Phone: [phone] Fax: [phone]   It was a pleasure seeing you today!  Please refer to the information below regarding your dental visit with [place].  Call us if any questions or concerns come up after you leave.   Thank you for giving [place] the opportunity to provide care for you.  If there is anything we can do for you, please let us know.    NEW PRESCRIPTIONS:   Prevident or sodium fluoride toothpaste was called in today to your pharmacy to start using immediately instead of your regular toothpaste.  Make sure you do not eat, drink or rinse with water after brushing for at least 30 minutes.   RECOMMENDATIONS: Brush after meals and at bedtime.  Floss once a day, and use fluoride at bedtime. Use CLOSYs for dry mouth, and salt water/baking soda rinses to help with any mouth sores. Take multiple sips of water as needed.  Stay hydrated. Return to your regular dentist for routine dental care including cleanings and periodic exams.  If you do not have a regular dentist, it is important to establish care at an outside dental office of your choice.  Call us if any problems or concerns arise.   TRISMUS: Trismus is a condition where the jaw does not allow the mouth to open as wide as it usually does.  This can happen almost suddenly, or in other cases the process is so slow, it is hard to notice it-until it is too far along.  When the jaw joints and/or muscles have been exposed to radiation treatments, the onset of trismus is very slow.  This is because the muscles are losing their stretching ability over a long period of time, as long as 2 YEARS after the end of radiation.  It is therefore important to let your dentist know if you start to notice any of these side effects early so they can help you with some jaw exercises.   QUESTIONS?  Call our office during office hours at [phone].

## 2021-01-21 NOTE — Progress Notes (Signed)
Department of Dental Medicine                   LIMITED EXAM   Service Date:   01/21/2021  Patient Name:   Tanner Thomas Date of Birth:   07/05/1955 Medical Record Number: [id number]  Referring Provider:  Eppie Gibson, M.D.   PLAN/RECOMMENDATIONS:   Assessment Xerostomia, weight loss, accretions on teeth.  Recommendations Brush after meals and at bedtime. Use fluoride at bedtime. Use trismus exercises as directed. Use CLoSYS for dry mouth and warm salt water rinses as needed. Take multiple sips of water as needed.  Plan Rx:  Sodium fluoride 1.1% toothpaste. Start using as soon as possible. Return for comprehensive exam, cleaning and treatment planning.   01/21/2021 Progress Note:  COVID-19 SCREENING:  The patient denies symptoms concerning for COVID-19 infection including fever, chills, cough, or newly developed shortness of breath.   HISTORY OF PRESENT ILLNESS: Tanner Thomas is a 65 y.o. male who presents today for an oral examination after radiation therapy for floor of mouth SCC.  On 06/29/20 the patient underwent tracheotomy, partial glossectomy and rim mandibulectomy, dental extractions, bilateral selective neck dissection zones 1 through 3, right platysma myocutaneous flap for floor of mouth reconstruction and feeding tube placement at [place].  Subsequently he also underwent mandibular irrigation and debridement due to initially poorly healing tissue.  He has completed 30/30 radiation treatments from 09/09/20 to 10/21/20 at [facility].  The patient was not seen prior to radiation therapy at the [facility] due to difficulties arranging transportation and communication delays in reaching the patient.  He did have multiple extractions completed at [facility] to prepare him for radiation.   REVIEW OF CHIEF COMPLAINTS: Dry mouth:  No Hard to swallow:  No Hurts to swallow:  No Taste changes:  No Sores in mouth:  No Limited opening:   No Weight loss:  Yes; per previous notes by medical/oncology team, he is eating better although still is sticking to soft foods/liquid diet.  He no longer has a PEG tube and his nutritional status is stable. Wt Readings from Last 3 Encounters:  01/18/21 122 lb 6.4 oz (55.5 kg)  12/21/20 119 lb (54 kg)  11/29/20 122 lb (55.3 kg)    AT- HOME ORAL HYGIENE REGIMEN: Brushing:  Yes, per pt twice daily. Flossing:  No Rinsing:  Yes, uses salt water and baking soda rinses as needed. Fluoride:  No. Rx for sodium fluoride toothpaste given to the patient today. 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 ([place]) 08/23/2020   Sinus bradycardia    Acute upper GI bleed 02/05/2020   Calculus of gallbladder without cholecystitis without obstruction    Lung nodule < 6cm on CT 07/05/2019   Tobacco use disorder 07/05/2019   Loss of weight 03/05/2019   Anemia 03/05/2019   Abnormal CT of the abdomen 03/05/2019   E coli Pyelonephritis 01/16/2019   Past Medical History:  Diagnosis Date   Known health problems: none    Past Surgical History:  Procedure Laterality Date   BILIARY DILATION  10/30/2019   Procedure: BILIARY DILATION;  Surgeon: Milus Banister, MD;  Location: [facility];  Service: Endoscopy;;   BILIARY DILATION  02/05/2020   Procedure: BILIARY DILATION;  Surgeon: Milus Banister, MD;  Location: [facility];  Service: Endoscopy;;  BILIARY STENT PLACEMENT N/A 10/30/2019   Procedure: BILIARY STENT PLACEMENT;  Surgeon: Milus Banister, MD;  Location: [facility];  Service: Endoscopy;  Laterality: N/A;   BILIARY STENT PLACEMENT N/A 02/05/2020   Procedure: BILIARY STENT PLACEMENT;  Surgeon: Milus Banister, MD;  Location: [facility];  Service: Endoscopy;  Laterality: N/A;   BIOPSY  03/06/2019   Procedure: BIOPSY;  Surgeon: Daneil Dolin, MD;  [facility];  Service: Endoscopy;;  gastric    BIOPSY  02/05/2020   Procedure: BIOPSY;  Surgeon: Milus Banister, MD;  Location: [facility];  Service: Endoscopy;;  bile buct brushing   CHOLECYSTECTOMY N/A 09/05/2019   Procedure: LAPAROSCOPIC CHOLECYSTECTOMY;  Surgeon: Aviva Signs, MD;  Location: [facility];  Service: General;  Laterality: N/A;   COLONOSCOPY WITH PROPOFOL N/A 03/06/2019   Procedure: COLONOSCOPY WITH PROPOFOL;  Surgeon: Daneil Dolin, MD;  [facility];  Service: Endoscopy;  Laterality: N/A;  9:45am - ok at 10:00 per Melanie   ENDOSCOPIC RETROGRADE CHOLANGIOPANCREATOGRAPHY (ERCP) WITH PROPOFOL N/A 10/30/2019   Procedure: ENDOSCOPIC RETROGRADE CHOLANGIOPANCREATOGRAPHY (ERCP) WITH PROPOFOL;  Surgeon: Milus Banister, MD;  Location: [facility];  Service: Endoscopy;  Laterality: N/A;   ENDOSCOPIC RETROGRADE CHOLANGIOPANCREATOGRAPHY (ERCP) WITH PROPOFOL N/A 02/05/2020   Procedure: ENDOSCOPIC RETROGRADE CHOLANGIOPANCREATOGRAPHY (ERCP) WITH PROPOFOL;  Surgeon: Milus Banister, MD;  Location: [facility];  Service: Endoscopy;  Laterality: N/A;   ESOPHAGOGASTRODUODENOSCOPY (EGD) WITH PROPOFOL N/A 03/06/2019   Procedure: ESOPHAGOGASTRODUODENOSCOPY (EGD) WITH PROPOFOL;  Surgeon: Daneil Dolin, MD;  [facility];  Service: Endoscopy;  Laterality: N/A;   ESOPHAGOGASTRODUODENOSCOPY (EGD) WITH PROPOFOL N/A 08/21/2019   Procedure: ESOPHAGOGASTRODUODENOSCOPY (EGD) WITH PROPOFOL;  Surgeon: Milus Banister, MD;  Location: [facility];  Service: Endoscopy;  Laterality: N/A;   EUS N/A 08/21/2019   Procedure: UPPER ENDOSCOPIC ULTRASOUND (EUS) RADIAL;  Surgeon: Milus Banister, MD;  Location[facility];  Service: Endoscopy;  Laterality: N/A;   PANCREAS SURGERY     REMOVAL OF STONES  10/30/2019   Procedure: REMOVAL OF STONES;  Surgeon: Milus Banister, MD;  [facility]: [facility];  Service: Endoscopy;;   REMOVAL OF STONES  02/05/2020   Procedure: REMOVAL OF STONES;  Surgeon: Milus Banister, MD;  Location: [facility];  Service: Endoscopy;;   SPHINCTEROTOMY  10/30/2019   Procedure: [name];  Surgeon: Milus Banister, MD;  Location[facility];  Service: Endoscopy;;   SPHINCTEROTOMY  02/05/2020   Procedure: [name];  Surgeon: Milus Banister, MD;  Location: [facility];  Service: Endoscopy;;   SPYGLASS CHOLANGIOSCOPY N/A 02/05/2020   Procedure: DTOIZTIW CHOLANGIOSCOPY;  Surgeon: Milus Banister, MD;  Location: [facility];  Service: Endoscopy;  Laterality: N/A;   STENT REMOVAL  02/05/2020   Procedure: STENT REMOVAL;  Surgeon: Milus Banister, MD;  Location: [facility];  Service: Endoscopy;;   Current Outpatient Medications  Medication Sig Dispense Refill   fluconazole (DIFLUCAN) 100 MG tablet Take 2 tablets today, then 1 tablet daily x 20 more days. (Patient not taking: Reported on 01/18/2021) 22 tablet 0   HYDROcodone-acetaminophen (HYCET) 7.5-325 mg/15 ml solution Take 10-15 mLs by mouth every 4 (four) hours as needed for moderate pain. (Patient not taking: Reported on 01/18/2021) 473 mL 0   lidocaine (XYLOCAINE) 2 % solution Patient: Mix 1part 2% viscous lidocaine, 1part H20. Swish & swallow 53mL of diluted mixture, 52min before meals and at bedtime, up to QID (Patient not taking: Reported on 01/18/2021) 300 mL 2   scopolamine (TRANSDERM-SCOP)  1 MG/3DAYS Place 1 patch (1.5 mg total) onto the skin every 3 (three) days. (Patient not taking: Reported on 01/18/2021) 10 patch 4   senna (SENOKOT) 8.6 MG TABS tablet Take 1 tablet (8.6 mg total) by mouth at bedtime as needed for mild constipation. (Patient not taking: Reported on 01/18/2021) 30 tablet 1   No current facility-administered medications for this visit.   No Known Allergies   VITALS: BP 94/66 (BP Location: Right Arm, Patient Position: Sitting, Cuff Size: Normal)   Pulse 66   Temp 97.8 F (36.6 C) (Oral)    DENTAL EXAM: Oral hygiene (plaque):  Yes, localized accumulation on distal surfaces of posterior remaining  dentition.  Generalized calculus build-up. Mucositis:  No Description of saliva:  OK;  slightly viscous, but pt denies symptoms and reports improvement since finishing radiation. Exposed bone:  No Other watched areas:  There is an area that is being monitored by ENT; a small opening on FOM from incomplete healing.    RADIOGRAPHIC EXAM: PAN and 4 periapical images were exposed and interpreted.  Condyles seated bilaterally in fossas.  No evidence of abnormal pathology.  All visualized osseous structures appear WNL.  Radiopaque areas scattered bilaterally in soft tissue regions consistent with the pt's recent surgeries. Remaining dentition appears supra-erupted.  Generalized moderate horizontal bone loss with areas of localized severe consistent with severe periodontitis.  Radiographic calculus accumulation evident.  Missing teeth.  Radiopaque  region in alveolar bone b/w #7 and #10 that resembles a retained root tip, possibly #8; #7 apex has widened PDL at apex and possible developing radiolucency vs external root resorption.    ASSESSMENT/DIAGNOSES:  Xerostomia:  He denies symptoms associated with dry mouth, but clinically notable.  Rx for Prevident written today for him to start using ASAP and at least once a day at bedtime. Weight Loss:  Nutritional status is stable. Accretions:  Encouraged good oral hygiene today and plan to see him for a cleaning at next visit.   PLAN AND RECOMMENDATIONS: Brush after meals and at bedtime.  Use fluoride at bedtime. Use trismus exercises as directed. Use CLoSYs for dry mouth and salt water/baking soda rinses as needed. Take multiple sips of water as needed. Rx:  Sodium fluoride 1.1% paste.  Start using immediately at least once daily.  Do not eat, drink or rinse with water for at least 30 minutes after use.  Return for cleaning, comprehensive exam and treatment planning. Call if any problems or concerns arise before next visit.  All questions and  concerns were invited and addressed.  The patient tolerated today's visit well and departed in stable condition.     S[place]ling Benson Norway, D.M.D.

## 2021-01-24 ENCOUNTER — Other Ambulatory Visit: Payer: Self-pay

## 2021-01-24 DIAGNOSIS — Z1329 Encounter for screening for other suspected endocrine disorder: Secondary | ICD-10-CM

## 2021-01-24 DIAGNOSIS — C049 Malignant neoplasm of floor of mouth, unspecified: Secondary | ICD-10-CM

## 2021-01-27 DIAGNOSIS — K117 Disturbances of salivary secretion: Secondary | ICD-10-CM | POA: Insufficient documentation

## 2021-01-27 DIAGNOSIS — Z0120 Encounter for dental examination and cleaning without abnormal findings: Secondary | ICD-10-CM | POA: Insufficient documentation

## 2021-01-27 DIAGNOSIS — Z08 Encounter for follow-up examination after completed treatment for malignant neoplasm: Secondary | ICD-10-CM | POA: Insufficient documentation

## 2021-01-27 DIAGNOSIS — K08109 Complete loss of teeth, unspecified cause, unspecified class: Secondary | ICD-10-CM | POA: Insufficient documentation

## 2021-01-27 DIAGNOSIS — K036 Deposits [accretions] on teeth: Secondary | ICD-10-CM | POA: Insufficient documentation

## 2021-01-28 ENCOUNTER — Encounter: Payer: Medicaid Other | Admitting: Internal Medicine

## 2021-02-07 DIAGNOSIS — C049 Malignant neoplasm of floor of mouth, unspecified: Secondary | ICD-10-CM | POA: Diagnosis not present

## 2021-02-17 ENCOUNTER — Telehealth: Payer: Self-pay

## 2021-02-17 NOTE — Telephone Encounter (Signed)
..   Medicaid Managed Care   Unsuccessful Outreach Note  02/17/2021 Name: Tanner Thomas MRN: [id number] DOB: 1955/06/23  Referred by: Pcp, No Reason for referral : High Risk Managed Medicaid (I called the patient today to get him scheduled for a phone visit with the [facility] Team. I left my name and number on his VM.)   An unsuccessful telephone outreach was attempted today. The patient was referred to the case management team for assistance with care management and care coordination.   Follow Up Plan: The care management team will reach out to the patient again over the next 7-14 days.   Thor, High Risk Medicaid [place]   .j

## 2021-02-23 ENCOUNTER — Ambulatory Visit: Payer: Medicaid Other

## 2021-02-23 ENCOUNTER — Other Ambulatory Visit: Payer: Self-pay

## 2021-02-23 ENCOUNTER — Encounter (HOSPITAL_COMMUNITY): Payer: Medicaid Other | Admitting: Dentistry

## 2021-02-23 ENCOUNTER — Ambulatory Visit: Payer: Medicaid Other | Attending: Radiation Oncology

## 2021-02-23 DIAGNOSIS — R1312 Dysphagia, oropharyngeal phase: Secondary | ICD-10-CM | POA: Diagnosis present

## 2021-02-23 DIAGNOSIS — R471 Dysarthria and anarthria: Secondary | ICD-10-CM | POA: Diagnosis present

## 2021-02-23 NOTE — Therapy (Signed)
[facility] [address] [place], [place], [address] Phone: [phone]   Fax:  [phone]  Speech Language Pathology Treatment/Renewal summary  Patient Details  Name: Tanner Thomas MRN: [id number] Date of Birth: October 08, 1955 Referring Provider (SLP): Eppie Gibson, MD   Encounter Date: 02/23/2021   End of Session - 02/23/21 1519     Visit Number 5    Number of Visits 7    Date for SLP Re-Evaluation 04/29/21    SLP Start Time 1430    SLP Stop Time  1504    SLP Time Calculation (min) 34 min    Activity Tolerance Patient tolerated treatment well             Past Medical History:  Diagnosis Date   Known health problems: none     Past Surgical History:  Procedure Laterality Date   BILIARY DILATION  10/30/2019   Procedure: BILIARY DILATION;  Surgeon: Milus Banister, MD;  Location: [facility];  Service: Endoscopy;;   BILIARY DILATION  02/05/2020   Procedure: BILIARY DILATION;  Surgeon: Milus Banister, MD;  Location: [facility];  Service: Endoscopy;;   BILIARY STENT PLACEMENT N/A 10/30/2019   Procedure: BILIARY STENT PLACEMENT;  Surgeon: Milus Banister, MD;  [facility];  Service: Endoscopy;  Laterality: N/A;   BILIARY STENT PLACEMENT N/A 02/05/2020   Procedure: BILIARY STENT PLACEMENT;  Surgeon: Milus Banister, MD;  [facility];  Service: Endoscopy;  Laterality: N/A;   BIOPSY  03/06/2019   Procedure: BIOPSY;  Surgeon: Daneil Dolin, MD;  [facility];  Service: Endoscopy;;  gastric   BIOPSY  02/05/2020   Procedure: BIOPSY;  Surgeon: Milus Banister, MD;  [facility];  Service: Endoscopy;;  bile buct brushing   CHOLECYSTECTOMY N/A 09/05/2019   Procedure: LAPAROSCOPIC CHOLECYSTECTOMY;  Surgeon: Aviva Signs, MD;  [facility];  Service: General;  Laterality: N/A;   COLONOSCOPY WITH PROPOFOL N/A 03/06/2019   Procedure: COLONOSCOPY WITH PROPOFOL;  Surgeon: Daneil Dolin, MD;  [facility];  Service: Endoscopy;  Laterality: N/A;  9:45am - ok at 10:00 per Melanie   ENDOSCOPIC RETROGRADE CHOLANGIOPANCREATOGRAPHY (ERCP) WITH PROPOFOL N/A 10/30/2019   Procedure: ENDOSCOPIC RETROGRADE CHOLANGIOPANCREATOGRAPHY (ERCP) WITH PROPOFOL;  Surgeon: Milus Banister, MD;  [facility];  Service: Endoscopy;  Laterality: N/A;   ENDOSCOPIC RETROGRADE CHOLANGIOPANCREATOGRAPHY (ERCP) WITH PROPOFOL N/A 02/05/2020   Procedure: ENDOSCOPIC RETROGRADE CHOLANGIOPANCREATOGRAPHY (ERCP) WITH PROPOFOL;  Surgeon: Milus Banister, MD;  [facility];  Service: Endoscopy;  Laterality: N/A;   ESOPHAGOGASTRODUODENOSCOPY (EGD) WITH PROPOFOL N/A 03/06/2019   Procedure: ESOPHAGOGASTRODUODENOSCOPY (EGD) WITH PROPOFOL;  Surgeon: Daneil Dolin, MD;  [facility];  Service: Endoscopy;  Laterality: N/A;   ESOPHAGOGASTRODUODENOSCOPY (EGD) WITH PROPOFOL N/A 08/21/2019   Procedure: ESOPHAGOGASTRODUODENOSCOPY (EGD) WITH PROPOFOL;  Surgeon: Milus Banister, MD;  [facility];  Service: Endoscopy;  Laterality: N/A;   EUS N/A 08/21/2019   Procedure: UPPER ENDOSCOPIC ULTRASOUND (EUS) RADIAL;  Surgeon: Milus Banister, MD;  [facility];  Service: Endoscopy;  Laterality: N/A;   PANCREAS SURGERY     REMOVAL OF STONES  10/30/2019   Procedure: REMOVAL OF STONES;  Surgeon: Milus Banister, MD;  [facility];  Service: Endoscopy;;   REMOVAL OF STONES  02/05/2020   Procedure: REMOVAL OF STONES;  Surgeon: Milus Banister, MD;  [facility];  Service: Endoscopy;;   [name]  10/30/2019  Procedure: SPHINCTEROTOMY;  Surgeon: Milus Banister, MD;  Location: [facility];  Service: Endoscopy;;   SPHINCTEROTOMY  02/05/2020   Procedure: [name];  Surgeon: Milus Banister, MD;  Location: [facility];  Service: Endoscopy;;   [id number] CHOLANGIOSCOPY N/A 02/05/2020   Procedure: YQMVHQIO CHOLANGIOSCOPY;  Surgeon: Milus Banister, MD;  [facility];  Service: Endoscopy;  Laterality: N/A;   STENT REMOVAL  02/05/2020   Procedure: STENT REMOVAL;  Surgeon: Milus Banister, MD;  [facility];  Service: Endoscopy;;    There were no vitals filed for this visit.    Subjective Assessment - 02/23/21 1436     Subjective "I was supposed to have a dentist appointment today but (it had to be rescheduled)".    Currently in Pain? No/denies                   ADULT SLP TREATMENT - 02/23/21 1437       General Information   Behavior/Cognition Alert;Cooperative;Pleasant mood      Treatment Provided   Treatment provided Dysphagia      Dysphagia Treatment   Temperature Spikes Noted No    Respiratory Status Room air    Oral Cavity - Dentition Missing dentition    Treatment Methods Skilled observation;Therapeutic exercise;Patient/caregiver education    Patient observed directly with PO's Yes    Type of PO's observed Thin liquids    Oral Phase Signs & Symptoms Prolonged bolus formation   pt reported bolus transit on lt x2 resulted in coughing; bolus transit on rt x8 did not result in coughing.   Pharyngeal Phase Signs & Symptoms Suspected delayed swallow initiation;Other (comment)   or premature spillage causing cough   Other treatment/comments Pt eating macaroni and cheese, pig's feet, fish, salmon cakes - "everything". "I cut it up small and just eat it." Pt reports no longer having to puree foods but cannot eat dry or crumbly foods unless he finds a way to soften it. "I'm doing them" (re: HEP), when pt was asked what exercises he was doing, he showed SLP oral movements not seen with his HEP in prior sessions, and then coughed intentionally. SLP provided max A initially with HEP, faded to pt being independent with each exercise at session end. SLP provided pt with handout of HEP but (orally) simplified the language for pt as on 11-22-20, highlighting some words in each exercise description for pt  to focus on.SLP told pt to complete HEP until Easter, or mid-April 2023 (as pt has not been completing exercises correctly).      Assessment / Recommendations / Plan   Plan Other (Comment)   pt to again return to once/month due to HEP production today     Dysphagia Recommendations   Diet recommendations Dysphagia 3 (mechanical soft);Dysphagia 1 (puree);Dysphagia 2 (fine chop);Thin liquid    Compensations Small sips/bites   place bolus on rt lingual surface and swallow on rt     Progression Toward Goals   Progression toward goals Progressing toward goals              SLP Education - 02/23/21 1519     Education Details HEP procedure, do HEP until Easter 2023 (mid-April)    Person(s) Educated Patient    Methods Explanation;Demonstration;Verbal cues;Handout    Comprehension Verbalized understanding;Returned demonstration;Verbal cues required;Need further instruction              SLP Short Term Goals - 11/22/20 1557       SLP  SHORT TERM GOAL #1   Title pt will complete HEP with rare min A    Period --   sessions, for all STGs   Status Achieved      SLP SHORT TERM GOAL #2   Title pt will tell SLP why pt is completing HEP with modified independence    Status Achieved      SLP SHORT TERM GOAL #3   Title pt will describe 3 overt s/s aspiration PNA with modified independence    Status Achieved      SLP SHORT TERM GOAL #4   Title pt will tell SLP how a food journal could hasten return to a more normalized diet    Status Achieved              SLP Long Term Goals - 02/23/21 1522       SLP LONG TERM GOAL #1   Title pt will complete HEP with modified independence over two visits    Baseline 12-24-20    Time 2    Period --   visits, for all LTGs   Status On-going    Target Date 04/29/21      SLP LONG TERM GOAL #2   Title pt will describe how to modify HEP over time, and the timeline associated with reduction in HEP frequency with modified independence over two  sessions    Time 2    Status On-going    Target Date 04/29/21      SLP LONG TERM GOAL #3   Title pt will tell SLP 3 overt s/sx aspiration PNA in 2 sessions    Time 3    Period --   visit   Status New    Target Date 04/29/21              Plan - 02/23/21 1520     Clinical Impression Statement At this time pt swallowing is deemed WFL/WNL with dys III items and thin liquids, if pt keeps bolus on rt side of oral cavity and reduced sip size. SLP reviewed pt's individualized HEP for oral and pharyngeal dysphagia and pt completed each exercise with initial max A faded to independence. There are no overt s/s aspiration PNA reported by pt at this time. Data indicate that pt's swallow ability will likely decrease over the course of radiation therapy and could very well decline over time following conclusion of their radiation therapy due to muscle disuse atrophy and/or muscle fibrosis. See "skilled intervention" for more details about today's session. Pt will cont to need to be seen by SLP in order to assess safety of PO intake, assess the need for recommending any objective swallow assessment, and ensuring pt correctly completes the individualized HEP. SLP, due to decr in HEP accuracy, again suggesting once every 4 weeks for ST. Pt agrees.    Speech Therapy Frequency --   once approx every 4 weeks   Duration --   3 visits/90 days   Treatment/Interventions Aspiration precaution training;Pharyngeal strengthening exercises;Diet toleration management by SLP;Trials of upgraded texture/liquids;Patient/family education;SLP instruction and feedback;Compensatory techniques    Potential to Achieve Goals Good    Potential Considerations Severity of impairments    SLP Home Exercise Plan provided today    Consulted and Agree with Plan of Care Patient             Patient will benefit from skilled therapeutic intervention in order to improve the following deficits and impairments:   Oropharyngeal  dysphagia  Dysarthria and  anarthria    Problem List Patient Active Problem List   Diagnosis Date Noted   Encounter for dental examination 01/27/2021   Accretions on teeth 01/27/2021   Teeth missing 01/27/2021   Xerostomia due to radiotherapy 01/27/2021   Encounter for follow-up examination after radiotherapy for malignant neoplasm 01/27/2021   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 [place], [place], [address] Phone: [phone]   Fax:  [phone]   Name: KANOA PHILLIPPI MRN: [id number] Date of Birth: 09-Oct-1955

## 2021-02-23 NOTE — Patient Instructions (Signed)
SWALLOWING EXERCISES Do these until Easter 2023, then 2-3 times per week afterwards  Effortful Swallows - Press your tongue against the roof of your mouth for 3 seconds, then squeeze the muscles in your neck while you swallow your saliva or a sip of water - Repeat 10-15 times, 2-3 times a day, and use whenever you eat or drink  Masako Swallow - swallow with your tongue sticking out - Stick tongue out past your lips and gently bite tongue with your teeth - Swallow, while holding your tongue with your teeth - Repeat 10-15 times, 2-3 times a day *use a wet spoon if your mouth gets dry*  Shaker Exercise - head lift - Lie flat on your back in your bed or on a couch without pillows - Raise your head and look at your feet - KEEP YOUR SHOULDERS DOWN - HOLD FOR 45-60 SECONDS, then lower your head back down - Repeat 3 times, 2-3 times a day  [employer] - "half swallow" exercise - Start to swallow, and keep your Adam's apple up by squeezing hard with the muscles of the throat - Hold the squeeze for 5-7 seconds and then relax - Repeat 10-15 times, 2-3 times a day *use a wet spoon if your mouth gets dry*  Tongue Stretch/Teeth Clean - Move your tongue around the pocket between your gums and teeth, clockwise and then counter-clockwise - Repeat on the back side, clockwise and then counter-clockwise - Repeat 15-20 times, 2-3 times a day       10. "Super Swallow"  - Take a breath and hold it  - Bear down (like pushing your bowels)  - Swallow then IMMEDIATELY cough  - Repeat 10 times, 2-3 times a day

## 2021-03-04 ENCOUNTER — Other Ambulatory Visit: Payer: Self-pay

## 2021-03-04 ENCOUNTER — Ambulatory Visit (INDEPENDENT_AMBULATORY_CARE_PROVIDER_SITE_OTHER): Payer: Medicaid Other | Admitting: Dentistry

## 2021-03-04 VITALS — BP 99/75 | HR 86 | Temp 98.6°F

## 2021-03-04 DIAGNOSIS — Y842 Radiological procedure and radiotherapy as the cause of abnormal reaction of the patient, or of later complication, without mention of misadventure at the time of the procedure: Secondary | ICD-10-CM

## 2021-03-04 DIAGNOSIS — K08109 Complete loss of teeth, unspecified cause, unspecified class: Secondary | ICD-10-CM | POA: Diagnosis not present

## 2021-03-04 DIAGNOSIS — Z923 Personal history of irradiation: Secondary | ICD-10-CM

## 2021-03-04 DIAGNOSIS — K0530 Chronic periodontitis, unspecified: Secondary | ICD-10-CM | POA: Diagnosis not present

## 2021-03-04 DIAGNOSIS — K117 Disturbances of salivary secretion: Secondary | ICD-10-CM | POA: Diagnosis not present

## 2021-03-04 DIAGNOSIS — Z0120 Encounter for dental examination and cleaning without abnormal findings: Secondary | ICD-10-CM

## 2021-03-04 DIAGNOSIS — K036 Deposits [accretions] on teeth: Secondary | ICD-10-CM

## 2021-03-04 DIAGNOSIS — C048 Malignant neoplasm of overlapping sites of floor of mouth: Secondary | ICD-10-CM

## 2021-03-04 MED ORDER — CHLORHEXIDINE GLUCONATE 0.12 % MT SOLN: 15.0000 mL | Freq: Two times a day (BID) | OROMUCOSAL | 0 refills | Status: AC

## 2021-03-04 NOTE — Progress Notes (Signed)
Department of Dental Medicine       FULL MOUTH DEBRIDEMENT   Service Date:   03/04/2021  Patient Name:   Tanner Thomas Date of Birth:   03-12-1956 Medical Record Number: [id number]   TODAY'S VISIT   Procedures Full mouth debridement & initial impression for upper partial denture Assessment: Periodontal impression:  Significant subgingival calculus, inflamed gingival tissue surrounding remaining dentition causing patient sensitivity & bleeding. Plan/recommendations: Rx: Chlorhexidine gluconate 0.12% to use as mouthrinse for 14 days prior to next visit. Next visit:  Finish cleaning/polish teeth, complete comprehensive exam   03/04/2021 Progress Note:  COVID-19 SCREENING:  The patient denies symptoms concerning for COVID-19 infection including fever, chills, cough, or newly developed shortness of breath.   HISTORY OF PRESENT ILLNESS: Tanner Thomas is a very pleasant 65 y.o. male with h/o floor of mouth cancer and head and neck radiation therapy who presented originally today for a new patient dental exam, treatment planning and cleaning.  Due to amount of calculus build-up, gingival bleeding and patient sensitivity, a full mouth debridement was completed today and he will return for completion of today's treatment at the next visit.   DENTAL HISTORY: The patient reports no changes to dental history.  He says that he has been using his prescription fluoride toothpaste as prescribed. He says the area on the floor of his mouth that is not healed is doing better and he continues to follow-up with ENT.  He currently denies any dental/orofacial pain or sensitivity. Patient is able to manage oral secretions.  Patient denies dysphagia, odynophagia, dysphonia, SOB and neck pain.  Patient denies fever, rigors and malaise.   CHIEF COMPLAINT: Here for a routine dental visit.   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/23/2020   Sinus bradycardia    Acute upper GI bleed 02/05/2020   Calculus of gallbladder without cholecystitis without obstruction    Lung nodule < 6cm on CT 07/05/2019   Tobacco use disorder 07/05/2019   Loss of weight 03/05/2019   Anemia 03/05/2019   Abnormal CT of the abdomen 03/05/2019   E coli Pyelonephritis 01/16/2019   Past Medical History:  Diagnosis Date   Known health problems: none    Past Surgical History:  Procedure Laterality Date   BILIARY DILATION  10/30/2019   Procedure: BILIARY DILATION;  Surgeon: Milus Banister, MD;  Location: [facility];  Service: Endoscopy;;   BILIARY DILATION  02/05/2020   Procedure: BILIARY DILATION;  Surgeon: Milus Banister, MD;  Location[facility];  Service: Endoscopy;;   BILIARY STENT PLACEMENT N/A 10/30/2019   Procedure: BILIARY STENT PLACEMENT;  Surgeon: Milus Banister, MD;  [facility];  Service: Endoscopy;  Laterality: N/A;   BILIARY STENT PLACEMENT N/A 02/05/2020   Procedure: BILIARY STENT PLACEMENT;  Surgeon: Milus Banister, MD;  [facility];  Service: Endoscopy;  Laterality: N/A;   BIOPSY  03/06/2019   Procedure: BIOPSY;  Surgeon: Daneil Dolin, MD;  [facility];  Service: Endoscopy;;  gastric   BIOPSY  02/05/2020   Procedure: BIOPSY;  Surgeon: Milus Banister, MD;  [facility];  Service: Endoscopy;;  bile  buct brushing   CHOLECYSTECTOMY N/A 09/05/2019   Procedure: LAPAROSCOPIC CHOLECYSTECTOMY;  Surgeon: Aviva Signs, MD;  [facility];  Service: General;  Laterality: N/A;   COLONOSCOPY WITH PROPOFOL N/A 03/06/2019   Procedure: COLONOSCOPY WITH PROPOFOL;  Surgeon: Daneil Dolin, MD;  [facility];  Service:  Endoscopy;  Laterality: N/A;  9:45am - ok at 10:00 per Melanie   ENDOSCOPIC RETROGRADE CHOLANGIOPANCREATOGRAPHY (ERCP) WITH PROPOFOL N/A 10/30/2019   Procedure: ENDOSCOPIC RETROGRADE CHOLANGIOPANCREATOGRAPHY (ERCP) WITH PROPOFOL;  Surgeon: Milus Banister, MD;  [facility];  Service: Endoscopy;  Laterality: N/A;   ENDOSCOPIC RETROGRADE CHOLANGIOPANCREATOGRAPHY (ERCP) WITH PROPOFOL N/A 02/05/2020   Procedure: ENDOSCOPIC RETROGRADE CHOLANGIOPANCREATOGRAPHY (ERCP) WITH PROPOFOL;  Surgeon: Milus Banister, MD;  [facility];  Service: Endoscopy;  Laterality: N/A;   ESOPHAGOGASTRODUODENOSCOPY (EGD) WITH PROPOFOL N/A 03/06/2019   Procedure: ESOPHAGOGASTRODUODENOSCOPY (EGD) WITH PROPOFOL;  Surgeon: Daneil Dolin, MD;  [facility];  Service: Endoscopy;  Laterality: N/A;   ESOPHAGOGASTRODUODENOSCOPY (EGD) WITH PROPOFOL N/A 08/21/2019   Procedure: ESOPHAGOGASTRODUODENOSCOPY (EGD) WITH PROPOFOL;  Surgeon: Milus Banister, MD;  [facility];  Service: Endoscopy;  Laterality: N/A;   EUS N/A 08/21/2019   Procedure: UPPER ENDOSCOPIC ULTRASOUND (EUS) RADIAL;  Surgeon: Milus Banister, MD;  [facility];  Service: Endoscopy;  Laterality: N/A;   PANCREAS SURGERY     REMOVAL OF STONES  10/30/2019   Procedure: REMOVAL OF STONES;  Surgeon: Milus Banister, MD;  [facility];  Service: Endoscopy;;   REMOVAL OF STONES  02/05/2020   Procedure: REMOVAL OF STONES;  Surgeon: Milus Banister, MD;  [facility];  Service: Endoscopy;;   SPHINCTEROTOMY  10/30/2019   Procedure: [name];  Surgeon: Milus Banister, MD;  [facility];  Service: Endoscopy;;   SPHINCTEROTOMY  02/05/2020   Procedure: [name];  Surgeon: Milus Banister, MD;  [facility];  Service: Endoscopy;;   SPYGLASS CHOLANGIOSCOPY N/A 02/05/2020   Procedure: EUMPNTIR CHOLANGIOSCOPY;  Surgeon: Milus Banister, MD;  [facility];  Service:  Endoscopy;  Laterality: N/A;   STENT REMOVAL  02/05/2020   Procedure: STENT REMOVAL;  Surgeon: Milus Banister, MD;  [facility];  Service: Endoscopy;;   No Known Allergies Current Outpatient Medications  Medication Sig Dispense Refill   fluconazole (DIFLUCAN) 100 MG tablet Take 2 tablets today, then 1 tablet daily x 20 more days. (Patient not taking: Reported on 01/18/2021) 22 tablet 0   HYDROcodone-acetaminophen (HYCET) 7.5-325 mg/15 ml solution Take 10-15 mLs by mouth every 4 (four) hours as needed for moderate pain. (Patient not taking: Reported on 01/18/2021) 473 mL 0   lidocaine (XYLOCAINE) 2 % solution Patient: Mix 1part 2% viscous lidocaine, 1part H20. Swish & swallow 27mL of diluted mixture, 23min before meals and at bedtime, up to QID (Patient not taking: Reported on 01/18/2021) 300 mL 2   scopolamine (TRANSDERM-SCOP) 1 MG/3DAYS Place 1 patch (1.5 mg total) onto the skin every 3 (three) days. (Patient not taking: Reported on 01/18/2021) 10 patch 4   senna (SENOKOT) 8.6 MG TABS tablet Take 1 tablet (8.6 mg total) by mouth at bedtime as needed for mild constipation. (Patient not taking: Reported on 01/18/2021) 30 tablet 1   Sodium Fluoride (SODIUM FLUORIDE 5000 PPM) 1.1 % PSTE Take 1 application by mouth in the morning and at bedtime. 96 g 11   No current facility-administered medications for this visit.    LABS: Lab Results  Component Value Date  WBC 8.0 02/05/2020   HGB 13.1 02/06/2020   HCT 40.1 02/06/2020   MCV 94.4 02/05/2020   PLT 112 (L) 02/05/2020      Component Value Date/Time   NA 141 01/17/2021 0804   K 3.2 (L) 01/17/2021 0804   CL 107 01/17/2021 0804   CO2 22 01/17/2021 0804   GLUCOSE 164 (H) 01/17/2021 0804   BUN 6 (L) 01/17/2021 0804   CREATININE 0.81 01/17/2021 0804   CALCIUM 9.2 01/17/2021 0804   GFRNONAA >60 01/17/2021 0804   GFRAA >60 09/03/2019 1459   Lab Results  Component Value Date   INR 1.2 09/03/2019   No results found for:  PTT  Social History   Socioeconomic History   Marital status: Single    Spouse name: Not on file   Number of children: Not on file   Years of education: Not on file   Highest education level: Not on file  Occupational History   Not on file  Tobacco Use   Smoking status: Former    Packs/day: 0.00    Years: 50.00    Pack years: 0.00    Types: Cigarettes    Start date: 05/24/1967    Quit date: 06/2020    Years since quitting: 0.6   Smokeless tobacco: Never  Vaping Use   Vaping Use: Never used  Substance and Sexual Activity   Alcohol use: Not Currently   Drug use: Not Currently    Types: Marijuana   Sexual activity: Yes  Other Topics Concern   Not on file  Social History Narrative   Not on file   Social Determinants of Health   Financial Resource Strain: High Risk   Difficulty of Paying Living Expenses: Very hard  Food Insecurity: No Food Insecurity   Worried About Charity fundraiser in the Last Year: Never true   Ran Out of Food in the Last Year: Never true  Transportation Needs: No Transportation Needs   Lack of Transportation (Medical): No   Lack of Transportation (Non-Medical): No  Physical Activity: Not on file  Stress: Not on file  Social Connections: Not on file  Intimate Partner Violence: Not on file   Family History  Problem Relation Age of Onset   Heart disease Father    Colon cancer Neg Hx    Gastric cancer Neg Hx    Esophageal cancer Neg Hx      REVIEW OF SYSTEMS:  Reviewed with the patient as per HPI. Psych: Patient denies having dental phobia.   VITAL SIGNS: BP 99/75 (BP Location: Right Arm, Patient Position: Sitting, Cuff Size: Normal)   Pulse 86   Temp 98.6 F (37 C) (Oral)     DENTAL EXAM:  Hard tissue and periodontal exam completed and all changes were charted.       Overall impression:  Poor remaining dentition.         Oral hygiene:  Poor  Periodontal:  Plaque:  Generalized Calculus:  Heavy subgingival/mild supra-gingival  accumulation, generalized Gingival appearance:  Inflamed and erythematous gingival tissue   ASSESSMENT:  1. History of floor of mouth cancer 2. History of chemoradiation therapy 3. Debridement of remaining dentition 4.  Accretions on teeth 5.  Xerostomia due to radiotherapy 6.  Periodontitis 7.  Missing teeth   PROCEDURES: Full mouth debridement. Removed all supra-gingival calculus and plaque using Cavitron and hand instruments.  Oral hygiene instruction discussed with the patient.  Encouraged better flossing and brushing at least twice a day and after  meals. Toothbrush, toothpaste and floss given to the patient.  Maxillary partial denture initial impressions. Upper impression taken in alginate.  Impression poured up with Type IV Microstone in lab.   PLAN AND RECOMMENDATIONS: I explained all significant findings of the dental exam with the patient including inflamed and irritated gums due to tartar build-up which causes bleeding and sensitivity, and the recommended care including using Peridex mouthrinse for 2 weeks and returning for the completion of cleaning and exam once his gums have had a chance to heal and inflammation has gone down.  The patient verbalized understanding of all findings, discussion, and recommendations and elected to move forward with treatment as indicated/recommended. Rx: Chlorhexidine gluconate 0.12% Call if any questions or concerns arise. Next visit:  Finish comprehensive exam/treatment planning and cleaning  All questions and concerns were invited and addressed.  The patient tolerated today's visit well and departed in stable condition.     Brave Benson [place], D.M.D.

## 2021-03-08 ENCOUNTER — Telehealth: Payer: Self-pay

## 2021-03-08 NOTE — Telephone Encounter (Signed)
..   Medicaid Managed Care   Unsuccessful Outreach Note  03/08/2021 Name: Tanner Thomas MRN: [id number] DOB: [birth year]  Referred by: Pcp, No Reason for referral : High Risk Managed Medicaid (I called the patient to offer the services of the [facility] Team. I left a 2nd VM with my name and number.)   A second unsuccessful telephone outreach was attempted today. The patient was referred to the case management team for assistance with care management and care coordination.   Follow Up Plan: The care management team will reach out to the patient again over the next 14 days.   [place]

## 2021-03-09 DIAGNOSIS — Z923 Personal history of irradiation: Secondary | ICD-10-CM | POA: Insufficient documentation

## 2021-03-09 DIAGNOSIS — K0530 Chronic periodontitis, unspecified: Secondary | ICD-10-CM | POA: Insufficient documentation

## 2021-03-09 DIAGNOSIS — Z0120 Encounter for dental examination and cleaning without abnormal findings: Secondary | ICD-10-CM | POA: Insufficient documentation

## 2021-03-23 ENCOUNTER — Other Ambulatory Visit: Payer: Self-pay

## 2021-03-23 ENCOUNTER — Ambulatory Visit (INDEPENDENT_AMBULATORY_CARE_PROVIDER_SITE_OTHER): Payer: Medicaid Other | Admitting: Dentistry

## 2021-03-23 VITALS — BP 117/79 | HR 76 | Temp 98.6°F

## 2021-03-23 DIAGNOSIS — K0889 Other specified disorders of teeth and supporting structures: Secondary | ICD-10-CM | POA: Diagnosis not present

## 2021-03-23 DIAGNOSIS — K029 Dental caries, unspecified: Secondary | ICD-10-CM

## 2021-03-23 DIAGNOSIS — K08109 Complete loss of teeth, unspecified cause, unspecified class: Secondary | ICD-10-CM

## 2021-03-23 DIAGNOSIS — C048 Malignant neoplasm of overlapping sites of floor of mouth: Secondary | ICD-10-CM | POA: Diagnosis not present

## 2021-03-23 DIAGNOSIS — K036 Deposits [accretions] on teeth: Secondary | ICD-10-CM | POA: Diagnosis not present

## 2021-03-23 DIAGNOSIS — K117 Disturbances of salivary secretion: Secondary | ICD-10-CM | POA: Diagnosis not present

## 2021-03-23 DIAGNOSIS — Z0120 Encounter for dental examination and cleaning without abnormal findings: Secondary | ICD-10-CM | POA: Diagnosis not present

## 2021-03-23 DIAGNOSIS — K030 Excessive attrition of teeth: Secondary | ICD-10-CM | POA: Diagnosis not present

## 2021-03-23 DIAGNOSIS — K045 Chronic apical periodontitis: Secondary | ICD-10-CM

## 2021-03-23 DIAGNOSIS — K056 Periodontal disease, unspecified: Secondary | ICD-10-CM

## 2021-03-23 DIAGNOSIS — Y842 Radiological procedure and radiotherapy as the cause of abnormal reaction of the patient, or of later complication, without mention of misadventure at the time of the procedure: Secondary | ICD-10-CM

## 2021-03-23 DIAGNOSIS — Z923 Personal history of irradiation: Secondary | ICD-10-CM | POA: Diagnosis not present

## 2021-03-23 NOTE — Progress Notes (Signed)
Department of Dental Medicine   Service Date:   03/23/2021  Patient Name:  Tanner Thomas Date of Birth:   1956/03/15 Medical Record Number: [id number]  Referring Provider:    Eppie Gibson, M.D.   TODAY'S VISIT: COMPREHENSIVE EXAM   EXAM: Soft tissue:  WNL Caries risk:  High Periodontal impression:  Chronic periodontitis Other findings:  Xerostomia  PLAN:  After a discussion & exploring various treatment options, the following treatment plan was finalized: Adult prophylaxis (completed today) ScRP #5 & #6- tentative plan is to monitor teeth at this time (see full note below) Restorative Maxillary acrylic RPD fabrication Recall interval:  Radiographs- 6 mos; prophy/periodontal maintenance- 4 mos  Rx:  Chlorhexidine gluconate 0.12%; sodium fluoride 1.1% paste Next visit:  Restorative per treatment plan       03/23/2021  PROGRESS NOTE:    COVID-19 SCREENING:  The patient denies symptoms concerning for COVID-19 infection including fever, chills, cough, or newly developed shortness of breath.   HISTORY OF PRESENT ILLNESS: Tanner Thomas is a very pleasant 65 y.o. 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   DENTAL HISTORY: The patient is a patient of record at the hospital dental clinic.  His last visit was a full mouth debridement and since then he does report using chlorhexidine rinses for 14 days as instructed.  He currently denies any dental/orofacial pain or sensitivity. Patient is able to manage oral secretions.  Patient denies dysphagia, odynophagia, dysphonia, SOB and neck pain.  Patient denies fever, rigors and malaise.   CHIEF COMPLAINT: Here to finish comprehensive exam, cleaning and treatment planning; patient with no complaints.   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 ([place]) 08/23/2020   Sinus bradycardia    Acute upper GI bleed 02/05/2020   Calculus of gallbladder without cholecystitis without obstruction    Lung nodule < 6cm on CT 07/05/2019   Tobacco use disorder 07/05/2019   Loss of weight 03/05/2019   Anemia 03/05/2019   Abnormal CT of the abdomen 03/05/2019   E coli Pyelonephritis 01/16/2019   Past Medical History:  Diagnosis Date   Known health problems: none    Past Surgical History:  Procedure Laterality Date   BILIARY DILATION  10/30/2019   Procedure: BILIARY DILATION;  Surgeon: Milus Banister, MD;  Location: [facility];  Service: Endoscopy;;   BILIARY DILATION  02/05/2020   Procedure: BILIARY DILATION;  Surgeon: Milus Banister, MD;  Location: [facility];  Service: Endoscopy;;   BILIARY STENT PLACEMENT N/A 10/30/2019   Procedure: BILIARY STENT PLACEMENT;  Surgeon: Milus Banister, MD;  Location: [facility];  Service: Endoscopy;  Laterality: N/A;   BILIARY STENT PLACEMENT N/A 02/05/2020   Procedure: BILIARY STENT PLACEMENT;  Surgeon: Milus Banister, MD;  Location: [facility];  Service: Endoscopy;  Laterality: N/A;   BIOPSY  03/06/2019   Procedure: BIOPSY;  Surgeon: Daneil Dolin, MD;  Location: [facility];  Service: Endoscopy;;  gastric   BIOPSY  02/05/2020   Procedure: BIOPSY;  Surgeon: Milus Banister, MD;  Location: [facility];  Service: Endoscopy;;  bile buct brushing   CHOLECYSTECTOMY N/A 09/05/2019   Procedure: LAPAROSCOPIC CHOLECYSTECTOMY;  Surgeon: Aviva Signs, MD;  Location: [facility];  Service: General;  Laterality: N/A;   COLONOSCOPY WITH PROPOFOL  N/A 03/06/2019   Procedure: COLONOSCOPY WITH PROPOFOL;  Surgeon: Daneil Dolin, MD;  [facility]: [facility];  Service: Endoscopy;  Laterality: N/A;  9:45am - ok at 10:00 per Melanie   ENDOSCOPIC RETROGRADE CHOLANGIOPANCREATOGRAPHY (ERCP) WITH PROPOFOL N/A 10/30/2019   Procedure: ENDOSCOPIC RETROGRADE CHOLANGIOPANCREATOGRAPHY (ERCP) WITH PROPOFOL;  Surgeon: Milus Banister, MD;  Location: [facility];  Service: Endoscopy;  Laterality: N/A;   ENDOSCOPIC RETROGRADE CHOLANGIOPANCREATOGRAPHY (ERCP) WITH PROPOFOL N/A 02/05/2020   Procedure: ENDOSCOPIC RETROGRADE CHOLANGIOPANCREATOGRAPHY (ERCP) WITH PROPOFOL;  Surgeon: Milus Banister, MD;  Location: [facility];  Service: Endoscopy;  Laterality: N/A;   ESOPHAGOGASTRODUODENOSCOPY (EGD) WITH PROPOFOL N/A 03/06/2019   Procedure: ESOPHAGOGASTRODUODENOSCOPY (EGD) WITH PROPOFOL;  Surgeon: Daneil Dolin, MD;  Location: [facility];  Service: Endoscopy;  Laterality: N/A;   ESOPHAGOGASTRODUODENOSCOPY (EGD) WITH PROPOFOL N/A 08/21/2019   Procedure: ESOPHAGOGASTRODUODENOSCOPY (EGD) WITH PROPOFOL;  Surgeon: Milus Banister, MD;  Location: [facility];  Service: Endoscopy;  Laterality: N/A;   EUS N/A 08/21/2019   Procedure: UPPER ENDOSCOPIC ULTRASOUND (EUS) RADIAL;  Surgeon: Milus Banister, MD;  Location: [facility];  Service: Endoscopy;  Laterality: N/A;   PANCREAS SURGERY     REMOVAL OF STONES  10/30/2019   Procedure: REMOVAL OF STONES;  Surgeon: Milus Banister, MD;  Location: [facility];  Service: Endoscopy;;   REMOVAL OF STONES  02/05/2020   Procedure: REMOVAL OF STONES;  Surgeon: Milus Banister, MD;  Location: [facility];  Service: Endoscopy;;   SPHINCTEROTOMY  10/30/2019   Procedure: [name];  Surgeon: Milus Banister, MD;  Location: [facility];  Service: Endoscopy;;   SPHINCTEROTOMY  02/05/2020   Procedure: [name];  Surgeon: Milus Banister, MD;  Location: [facility];  Service: Endoscopy;;   SPYGLASS CHOLANGIOSCOPY N/A  02/05/2020   Procedure: XBJYNWGN CHOLANGIOSCOPY;  Surgeon: Milus Banister, MD;  Location: [facility];  Service: Endoscopy;  Laterality: N/A;   STENT REMOVAL  02/05/2020   Procedure: STENT REMOVAL;  Surgeon: Milus Banister, MD;  Location: [facility];  Service: Endoscopy;;   No Known Allergies Current Outpatient Medications  Medication Sig Dispense Refill   fluconazole (DIFLUCAN) 100 MG tablet Take 2 tablets today, then 1 tablet daily x 20 more days. (Patient not taking: Reported on 01/18/2021) 22 tablet 0   HYDROcodone-acetaminophen (HYCET) 7.5-325 mg/15 ml solution Take 10-15 mLs by mouth every 4 (four) hours as needed for moderate pain. (Patient not taking: Reported on 01/18/2021) 473 mL 0   lidocaine (XYLOCAINE) 2 % solution Patient: Mix 1part 2% viscous lidocaine, 1part H20. Swish & swallow 27mL of diluted mixture, 93min before meals and at bedtime, up to QID (Patient not taking: Reported on 01/18/2021) 300 mL 2   scopolamine (TRANSDERM-SCOP) 1 MG/3DAYS Place 1 patch (1.5 mg total) onto the skin every 3 (three) days. (Patient not taking: Reported on 01/18/2021) 10 patch 4   senna (SENOKOT) 8.6 MG TABS tablet Take 1 tablet (8.6 mg total) by mouth at bedtime as needed for mild constipation. (Patient not taking: Reported on 01/18/2021) 30 tablet 1   Sodium Fluoride (SODIUM FLUORIDE 5000 PPM) 1.1 % PSTE Take 1 application by mouth in the morning and at bedtime. 96 g 11  No current facility-administered medications for this visit.    LABS: Lab Results  Component Value Date   WBC 8.0 02/05/2020   HGB 13.1 02/06/2020   HCT 40.1 02/06/2020   MCV 94.4 02/05/2020   PLT 112 (L) 02/05/2020      Component Value Date/Time   NA 141 01/17/2021 0804   K 3.2 (L) 01/17/2021 0804   CL 107 01/17/2021 0804   CO2 22 01/17/2021 0804   GLUCOSE 164 (H) 01/17/2021 0804   BUN 6 (L) 01/17/2021 0804   CREATININE 0.81 01/17/2021 0804   CALCIUM 9.2 01/17/2021 0804   GFRNONAA >60 01/17/2021 0804   GFRAA  >60 09/03/2019 1459   Lab Results  Component Value Date   INR 1.2 09/03/2019   No results found for: PTT  Social History   Socioeconomic History   Marital status: Single    Spouse name: Not on file   Number of children: Not on file   Years of education: Not on file   Highest education level: Not on file  Occupational History   Not on file  Tobacco Use   Smoking status: Former    Packs/day: 0.00    Years: 50.00    Pack years: 0.00    Types: Cigarettes    Start date: 05/24/1967    Quit date: 06/2020    Years since quitting: 0.7   Smokeless tobacco: Never  Vaping Use   Vaping Use: Never used  Substance and Sexual Activity   Alcohol use: Not Currently   Drug use: Not Currently    Types: Marijuana   Sexual activity: Yes  Other Topics Concern   Not on file  Social History Narrative   Not on file   Social Determinants of Health   Financial Resource Strain: High Risk   Difficulty of Paying Living Expenses: Very hard  Food Insecurity: No Food Insecurity   Worried About Charity fundraiser in the Last Year: Never true   Ran Out of Food in the Last Year: Never true  Transportation Needs: No Transportation Needs   Lack of Transportation (Medical): No   Lack of Transportation (Non-Medical): No  Physical Activity: Not on file  Stress: Not on file  Social Connections: Not on file  Intimate Partner Violence: Not on file   Family History  Problem Relation Age of Onset   Heart disease Father    Colon cancer Neg Hx    Gastric cancer Neg Hx    Esophageal cancer Neg Hx      REVIEW OF SYSTEMS:  Reviewed with the patient as per HPI. Psych:  Patient denies having dental phobia.   ANTIBIOTIC PROPHYLAXIS OR OTHER PREMEDICATION: None indicated.   VITAL SIGNS: BP 117/79 (BP Location: Right Arm, Patient Position: Sitting, Cuff Size: Normal)   Pulse 76   Temp 98.6 F (37 C) (Oral)    PHYSICAL EXAM:  Soft tissue exam completed and charted.  General:  Well-developed,  comfortable and in no apparent distress. Neurological:  Alert and oriented to person, place and  time. Extraoral:  Head size, head shape, facial symmetry, skin, complexion, pigmentation, conjunctiva, oral labia, parotid gland, submandibular gland, thyroid, cervical and preauricular lymph nodes, submandibular and submental lymph nodes, tonsillar and occipital lymph nodes and supraclavicular lymph nodes WNL. No swelling or lymphadenopathy.  TMJ asymptomatic without clicks or crepitations.  Continues to deny symptoms with trismus. Intraoral:  Soft tissues appear well-perfused.  FOM and vestibules soft and not raised. Oral cavity without mass or lesion. No  signs of infection, parulis, sinus tract, edema or erythema evident upon exam. (+) Mucous membranes:  Thick, viscous salivary secretions   DENTAL EXAM:  Hard tissue and periodontal exam completed and charted.     Overall impression:  Poor remaining dentition.    Oral hygiene:  Fair      HARD TISSUE:    Missing teeth:  Maxillary: #1, #2, #3, #4, #8, #9, #14, #15, #16. Mandibular: no remaining dentition . Caries:  Questionable prognosis:  #7I & MFL   Restorable:  #5DO, #10I, #11I, #12O, #13O  Removable/Fixed Prosthodontics:  Patient denies history of wearing partial dentures.  Occlusion:  Unable to assess occlusion or MIO.   Non-functional teeth:  #5, #12, #13   Supra-erupted teeth:  #5, #6, #7, #10, #11, #12, #13   Other: #5, #6, #7 drifting mesial; #10-#13 drifting mesial Other findings:   (+) Attrition/wear: #6, #7, #10 and #11 incisal      PERIODONTAL:   Probing depths (mm):  Range between 2 - 6; localized 8 - 9 pockets measured on #5 mesial and #6 distal Plaque:  Generalized Calculus:  Heavy supra- and subgingival accumulation; generalized Mobility:  class I- #13;  class II-  #5, #7 Periodontally compromised teeth:  #5, #7, #13- monitor Gingival appearance:  Inflamed and erythematous gingival tissue, however much improved since  last visit (debridement + chlorhexidine rinses)  Exam performed by Sereniti Wan B. Benson Norway, DMD with Leta Speller, DAII acting as scribe.   RADIOGRAPHIC EXAM:  PAN and 5 periapical images were exposed and interpreted on 01/21/2021:      Condyles seated bilaterally in fossas.  No evidence of abnormal pathology.  All visualized osseous structures appear WNL.  Radiopaque areas scattered bilaterally in soft tissue regions consistent with the pt's recent surgeries. Remaining dentition appears supra-erupted.      Generalized moderate horizontal bone loss with areas of localized severe consistent with severe periodontitis.  Radiographic calculus accumulation evident.  Missing teeth.  Radiopaque  region in alveolar bone b/w #7 and #10 that resembles a retained root tip, possibly #8; #7 apex has widened PDL at apex and possible developing radiolucency vs external root resorption.    ASSESSMENT:  1.  History of radiation to the head and neck region 2.  History of floor of mouth cancer 3.  Comprehensive dental exam 4.  Missing teeth 5.  Radiation caries 6.  Accretions 7.  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  Loose teeth 9.  Chronic apical periodontitis 10.  Periodontal disease 11.  Attrition/wear   PROCEDURES: Adult prophylaxis.  Removed all supra- and subgingival calculus and plaque using Cavitron and hand instruments. Polished all teeth.  Flossed. Oral hygiene instruction discussed with the patient.  Encouraged better flossing and brushing for 2 minutes twice a day. Toothbrush, toothpaste and floss given to the patient.   PLAN/RECOMMENDATIONS: I discussed the risks, benefits, and complications of various scenarios with the patient in relationship to their medical and dental conditions.  I discussed postoperative risk of systemic infection and other serious complications such as osteoradionecrosis if dental/oral concerns are not addressed. I explained all significant findings of the  dental consultation with the patient including radiation cavities, dry mouth, loose teeth and bone loss with diagnosis of chronic periodontal disease, and the recommended care including restorative, isolated ScRP of teeth numbers 5 and 6 due to deep probing depths vs extraction if not improved by next recall visit and maxillary upper acrylic RPD fabrication in order to optimize them from a dental  standpoint.  I did explain that it would not be possible to make a lower complete denture due to his flap and loss of alveolar bone.  The patient verbalized understanding of all findings, discussion, and recommendations. We then discussed various treatment options to include no treatment, multiple extractions with alveoloplasty, pre-prosthetic surgery as indicated, periodontal therapy, dental restorations, root canal therapy, crown and bridge therapy, implant therapy, and replacement of missing teeth as indicated.   The patient verbalized understanding of all options, and currently wishes to proceed with the treatment plan below:  Adult prophy (completed today) ScRP #5 & #6, but is also okay with measuring probing depths at next recall and potential extraction of one or more teeth- tentative plan is to monitor at this time. Restorative Upper maxillary acrylic RPD Recall interval:  Periodontal maintenance- 4 mos; Bitewings/PA's - 6 mos  Rx:  Chlorhexidine gluconate 0.12% rinses for 2 weeks; sodium fluoride 1.1% paste for fluoride trays Next visit:  Restorative   All questions and concerns were invited and addressed.  The patient tolerated today's visit well and departed in stable condition.  I spent in excess of 120 minutes during the conduct of this consultation and >50% of this time involved direct face-to-face encounter for counseling and/or coordination of the patient's care.      Piute Benson Norway, D.M.D.

## 2021-03-24 ENCOUNTER — Telehealth: Payer: Self-pay

## 2021-03-24 NOTE — Telephone Encounter (Signed)
..   Medicaid Managed Care   Unsuccessful Outreach Note  03/24/2021 Name: Tanner Thomas MRN: [id number] DOB: February 03, 1956  Referred by: Pcp, No Reason for referral : High Risk Managed Medicaid (Third attempt to reach patient to schedule phone visit with the [facility] Team. I left my name and number on his VM.)   Third unsuccessful telephone outreach was attempted today. The patient was referred to the case management team for assistance with care management and care coordination. The patient's primary care provider has been notified of our unsuccessful attempts to make or maintain contact with the patient. The care management team is pleased to engage with this patient at any time in the future should he/she be interested in assistance from the care management team.   Follow Up Plan: We have been unable to make contact with the patient for follow up. The care management team is available to follow up with the patient after provider conversation with the patient regarding recommendation for care management engagement and subsequent re-referral to the care management team.   [place], [place]

## 2021-03-29 ENCOUNTER — Encounter: Payer: Self-pay | Admitting: Gastroenterology

## 2021-03-30 ENCOUNTER — Ambulatory Visit: Payer: Medicaid Other | Attending: Radiation Oncology

## 2021-03-30 ENCOUNTER — Other Ambulatory Visit: Payer: Self-pay

## 2021-03-30 DIAGNOSIS — R1312 Dysphagia, oropharyngeal phase: Secondary | ICD-10-CM | POA: Diagnosis present

## 2021-03-30 DIAGNOSIS — R471 Dysarthria and anarthria: Secondary | ICD-10-CM | POA: Diagnosis present

## 2021-03-30 NOTE — Patient Instructions (Addendum)
Tanner Thomas [phone]

## 2021-03-30 NOTE — Therapy (Signed)
[facility] [address], [place], [place], [address] Phone: [phone]   Fax:  [phone]  Speech Language Pathology Treatment  Patient Details  Name: Tanner Thomas MRN: [id number] Date of Birth: 1955/09/23 Referring Provider (SLP): Eppie Gibson, MD   Encounter Date: 03/30/2021   End of Session - 03/30/21 1359     Visit Number 6    Number of Visits 7    Date for SLP Re-Evaluation 04/29/21    SLP Start Time 7026    SLP Stop Time  1350    SLP Time Calculation (min) 32 min    Activity Tolerance Patient tolerated treatment well             Past Medical History:  Diagnosis Date   Known health problems: none     Past Surgical History:  Procedure Laterality Date   BILIARY DILATION  10/30/2019   Procedure: BILIARY DILATION;  Surgeon: Milus Banister, MD;  Location: [facility];  Service: Endoscopy;;   BILIARY DILATION  02/05/2020   Procedure: BILIARY DILATION;  Surgeon: Milus Banister, MD;  Location: [facility];  Service: Endoscopy;;   BILIARY STENT PLACEMENT N/A 10/30/2019   Procedure: BILIARY STENT PLACEMENT;  Surgeon: Milus Banister, MD;  Location: [facility];  Service: Endoscopy;  Laterality: N/A;   BILIARY STENT PLACEMENT N/A 02/05/2020   Procedure: BILIARY STENT PLACEMENT;  Surgeon: Milus Banister, MD;  [facility];  Service: Endoscopy;  Laterality: N/A;   BIOPSY  03/06/2019   Procedure: BIOPSY;  Surgeon: Daneil Dolin, MD;  [facility];  Service: Endoscopy;;  gastric   BIOPSY  02/05/2020   Procedure: BIOPSY;  Surgeon: Milus Banister, MD;  [facility]: [facility];  Service: Endoscopy;;  bile buct brushing   CHOLECYSTECTOMY N/A 09/05/2019   Procedure: LAPAROSCOPIC CHOLECYSTECTOMY;  Surgeon: Aviva Signs, MD;  Location: [facility];  Service: General;  Laterality: N/A;   COLONOSCOPY WITH PROPOFOL N/A 03/06/2019   Procedure: COLONOSCOPY WITH PROPOFOL;  Surgeon: Daneil Dolin, MD;   [facility];  Service: Endoscopy;  Laterality: N/A;  9:45am - ok at 10:00 per Melanie   ENDOSCOPIC RETROGRADE CHOLANGIOPANCREATOGRAPHY (ERCP) WITH PROPOFOL N/A 10/30/2019   Procedure: ENDOSCOPIC RETROGRADE CHOLANGIOPANCREATOGRAPHY (ERCP) WITH PROPOFOL;  Surgeon: Milus Banister, MD;  [facility];  Service: Endoscopy;  Laterality: N/A;   ENDOSCOPIC RETROGRADE CHOLANGIOPANCREATOGRAPHY (ERCP) WITH PROPOFOL N/A 02/05/2020   Procedure: ENDOSCOPIC RETROGRADE CHOLANGIOPANCREATOGRAPHY (ERCP) WITH PROPOFOL;  Surgeon: Milus Banister, MD;  [facility];  Service: Endoscopy;  Laterality: N/A;   ESOPHAGOGASTRODUODENOSCOPY (EGD) WITH PROPOFOL N/A 03/06/2019   Procedure: ESOPHAGOGASTRODUODENOSCOPY (EGD) WITH PROPOFOL;  Surgeon: Daneil Dolin, MD;  [facility];  Service: Endoscopy;  Laterality: N/A;   ESOPHAGOGASTRODUODENOSCOPY (EGD) WITH PROPOFOL N/A 08/21/2019   Procedure: ESOPHAGOGASTRODUODENOSCOPY (EGD) WITH PROPOFOL;  Surgeon: Milus Banister, MD;  [facility];  Service: Endoscopy;  Laterality: N/A;   EUS N/A 08/21/2019   Procedure: UPPER ENDOSCOPIC ULTRASOUND (EUS) RADIAL;  Surgeon: Milus Banister, MD;  [facility];  Service: Endoscopy;  Laterality: N/A;   PANCREAS SURGERY     REMOVAL OF STONES  10/30/2019   Procedure: REMOVAL OF STONES;  Surgeon: Milus Banister, MD;  [facility];  Service: Endoscopy;;   REMOVAL OF STONES  02/05/2020   Procedure: REMOVAL OF STONES;  Surgeon: Milus Banister, MD;  [facility];  Service: Endoscopy;;   SPHINCTEROTOMY  10/30/2019   Procedure:  SPHINCTEROTOMY;  Surgeon: Milus Banister, MD;  Location: [facility];  Service: Endoscopy;;   SPHINCTEROTOMY  02/05/2020   Procedure: [name];  Surgeon: Milus Banister, MD;  Location: [facility];  Service: Endoscopy;;   EEFEOFHQ CHOLANGIOSCOPY N/A 02/05/2020   Procedure: RFXJOITG CHOLANGIOSCOPY;  Surgeon: Milus Banister, MD;   [facility];  Service: Endoscopy;  Laterality: N/A;   STENT REMOVAL  02/05/2020   Procedure: STENT REMOVAL;  Surgeon: Milus Banister, MD;  [facility];  Service: Endoscopy;;    There were no vitals filed for this visit.          ADULT SLP TREATMENT - 03/30/21 1324       General Information   Behavior/Cognition Alert;Cooperative;Pleasant mood      Treatment Provided   Treatment provided Dysphagia      Dysphagia Treatment   Temperature Spikes Noted No    Respiratory Status Room air    Oral Cavity - Dentition Missing dentition    Treatment Methods Skilled observation;Therapeutic exercise;Patient/caregiver education    Patient observed directly with PO's Yes    Type of PO's observed Dysphagia 1 (puree);Thin liquids   consecutive multiple sips water!   Oral Phase Signs & Symptoms Other (comment)   none noted today   Pharyngeal Phase Signs & Symptoms --   none noted today   Other treatment/comments In the last week, pt endorses eating beets, mashed potatoes, chicken, beans, fish, mac and cheese. SLP reminded pt what the situation was last time when he exhibited cough (when pt reported bolus transitted on left side oral and/or pharyngeal cavities), and Tanner Thomas said he has had no coughing during meals since last ST visit. Pt stated he was completing HEP x3/week. SLP encouraged pt to complete every day until Easter, with rationale provided. Pt req'd min A occasionally for adequate completion of HEP. Lastly, SLP noted [facility] employee Reita Chard was trying to reach Tanner Thomas - SLP provided quick explanation of why she is trying to get a hold of pt and provided Alley's phone number for him.      Pain Assessment   Pain Assessment No/denies pain      Assessment / Recommendations / Plan   Plan Continue with current plan of care      Dysphagia Recommendations   Diet recommendations Dysphagia 3 (mechanical soft);Thin liquid    Compensations Small sips/bites               SLP Education - 03/30/21 1358     Education Details HEP procedure, rationale for HEP    Person(s) Educated Patient    Methods Explanation;Demonstration    Comprehension Verbalized understanding;Returned demonstration              SLP Short Term Goals - 11/22/20 1557       SLP SHORT TERM GOAL #1   Title pt will complete HEP with rare min A    Period --   sessions, for all STGs   Status Achieved      SLP SHORT TERM GOAL #2   Title pt will tell SLP why pt is completing HEP with modified independence    Status Achieved      SLP SHORT TERM GOAL #3   Title pt will describe 3 overt s/s aspiration PNA with modified independence    Status Achieved      SLP SHORT TERM GOAL #4   Title pt will tell SLP how a food journal could hasten return to a more normalized diet  Status Achieved              SLP Long Term Goals - 03/30/21 1401       SLP LONG TERM GOAL #1   Title pt will complete HEP with modified independence over two visits    Baseline 12-24-20    Time 1    Period --   visits, for all LTGs   Status On-going    Target Date 04/29/21      SLP LONG TERM GOAL #2   Title pt will describe how to modify HEP over time, and the timeline associated with reduction in HEP frequency with modified independence over two sessions    Time 1    Status On-going    Target Date 04/29/21      SLP LONG TERM GOAL #3   Title pt will tell SLP 3 overt s/sx aspiration PNA in 2 sessions    Time 2    Period --   visit   Status New    Target Date 04/29/21              Plan - 03/30/21 1400     Clinical Impression Statement At this time pt swallowing is deemed WFL/WNL with dys I items and thin liquids, however pt reports no coughing since last ST session with any food or liquid. SLP cont to recommend reduced sip size. SLP reviewed pt's individualized HEP for oral and pharyngeal dysphagia and pt completed each exercise with occasional min A faded to independence. There are no  overt s/s aspiration PNA reported by pt at this time. Data indicate that pt's swallow ability will likely decrease over the course of radiation therapy and could very well decline over time following conclusion of their radiation therapy due to muscle disuse atrophy and/or muscle fibrosis. See "skilled intervention" for more details about today's session. Pt will cont to need to be seen by SLP in order to assess safety of PO intake, assess the need for recommending any objective swallow assessment, and ensuring pt correctly completes the individualized HEP. Pt may be d/c'd next session if he cont to perform HEP with good success.    Speech Therapy Frequency --   once approx every 4 weeks   Duration --   3 visits/90 days   Treatment/Interventions Aspiration precaution training;Pharyngeal strengthening exercises;Diet toleration management by SLP;Trials of upgraded texture/liquids;Patient/family education;SLP instruction and feedback;Compensatory techniques    Potential to Achieve Goals Good    Potential Considerations Severity of impairments    SLP Home Exercise Plan provided today    Consulted and Agree with Plan of Care Patient             Patient will benefit from skilled therapeutic intervention in order to improve the following deficits and impairments:   Oropharyngeal dysphagia  Dysarthria and anarthria    Problem List Patient Active Problem List   Diagnosis Date Noted   History of head and neck radiation 03/09/2021   Encounter for routine dental examination 03/09/2021   Chronic periodontitis 03/09/2021   Encounter for dental examination 01/27/2021   Accretions on teeth 01/27/2021   Teeth missing 01/27/2021   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 [place], [place], [address] Phone: [phone]   Fax:  [phone]   Name: Tanner Thomas MRN: [id number] Date of Birth: 1955-10-29

## 2021-04-05 ENCOUNTER — Other Ambulatory Visit: Payer: Self-pay

## 2021-04-05 ENCOUNTER — Other Ambulatory Visit: Payer: Self-pay | Admitting: *Deleted

## 2021-04-05 DIAGNOSIS — Z0000 Encounter for general adult medical examination without abnormal findings: Secondary | ICD-10-CM

## 2021-04-05 NOTE — Patient Instructions (Signed)
Visit Information  Tanner Thomas was given information about Medicaid Managed Care team care coordination services as a part of their [place] Medicaid benefit. [name] verbally consented to engagement with the [facility] Managed Care team.   If you are experiencing a medical emergency, please call 911 or report to your local emergency department or urgent care.   If you have a non-emergency medical problem during routine business hours, please contact your provider's office and ask to speak with a nurse.   For questions related to your [facility], please call: [phone] or visit the homepage here: [email or web address]  If you would like to schedule transportation through your [facility], please call the following number at least 2 days in advance of your appointment: [phone].   Call the [place] at [phone], at any time, 24 hours a day, 7 days a week. If you are in danger or need immediate medical attention call 911.  If you would like help to quit smoking, call 1-800-QUIT-NOW (934)207-9406) OR Espaol: 1-855-Djelo-Ya (2-505-397-6734) o para ms informacin haga clic aqu or Text READY to 200-400 to register via text  Mr. Tanner Thomas - following are the goals we discussed in your visit today:   Goals Addressed   None     Please see education materials related to pain management and nutrition provided as print materials.   The patient verbalized understanding of instructions provided today and agreed to receive a mailed copy of patient instruction and/or educational materials.  Telephone follow up appointment with Managed Medicaid care management team member scheduled for:05/05/21 @ 2:30pm  [name] RN, BSN [place] RN Care Coordinator   Following is a copy of your plan of care:  Care Plan :  RN Care Manager Plan of Care  Updates made by Melissa Montane, RN since 04/05/2021 12:00 AM     Problem: Health Management needs related to Mouth Cancer and Knee pain      Long-Range Goal: Development of Plan of Care to address Health Management needs related to Mouth Cancer and Knee pain   Start Date: 04/05/2021  Expected End Date: 07/04/2021  Priority: High  Note:   Current Barriers:  Chronic Disease Management support and education needs related to mouth cancer and knee pain  RNCM Clinical Goal(s):  Patient will verbalize understanding of plan for management of mouth cancer and knee pain as evidenced by patient verbalization and self monitoring activities attend all scheduled medical appointments: 05/03/21 @ 9am with Dr. Posey Pronto as evidenced by provider documentation in EMR        demonstrate improved adherence to prescribed treatment plan for mouth cancer and knee pain as evidenced by documentation in EMR continue to work with RN Care Manager and/or Social Worker to address care management and care coordination needs related to mouth cancer and knee pain as evidenced by adherence to CM Team Scheduled appointments     work with [employer] care guide to address needs related to [employer] knowledge of community resource: [employer] accepting [employer] as evidenced by patient and/or community resource care guide support    through collaboration with Consulting civil engineer, provider, and care team.   Interventions: Inter-disciplinary care team collaboration (see longitudinal plan of care) Evaluation of current treatment plan related to  self management and patient's adherence to plan as established by provider Advised patient to attend new patient visit with Dr. Posey Pronto at  [place], provided address and telephone number   Oncology:  (New goal.) Long Term Goal  Assessment of understanding of oncology diagnosis:  Assessed patient understanding of cancer diagnosis and  recommended treatment plan Reviewed upcoming provider appointments and treatment appointments Assessed available transportation to appointments and treatments. Has consistent/reliable transportation: Yes Assessed support system. Has consistent/reliable family or other support: Yes Nutrition assessment performed  Pain:  (Status: New goal.) Long Term Goal  Pain assessment performed Medications reviewed Discussed importance of adherence to all scheduled medical appointments; Counseled on the importance of reporting any/all new or changed pain symptoms or management strategies to pain management provider; Discussed use of relaxation techniques and/or diversional activities to assist with pain reduction (distraction, imagery, relaxation, massage, acupressure, TENS, heat, and cold application; Advised patient to discuss chronic knee pain with provider; Assessed social determinant of health barriers;   Patient Goals/Self-Care Activities: Attend all scheduled provider appointments Attend church or other social activities Perform all self care activities independently  Perform IADL's (shopping, preparing meals, housekeeping, managing finances) independently Call provider office for new concerns or questions

## 2021-04-05 NOTE — Patient Outreach (Signed)
Medicaid Managed Care   Nurse Care Manager Note  04/05/2021 Name:  Tanner Thomas MRN:  [id number] DOB:  Sep 24, 1955  Tanner Thomas is an 65 y.o. year old male who is a primary patient of Pcp, No.  The [facility] Managed Care Coordination team was consulted for assistance with:    Mouth cancer and knee pain  Tanner Thomas was given information about Medicaid Managed Care Coordination team services today. Tanner Thomas Patient agreed to services and verbal consent obtained.  Engaged with patient by telephone for initial visit in response to provider referral for case management and/or care coordination services.   Assessments/Interventions:  Review of past medical history, allergies, medications, health status, including review of consultants reports, laboratory and other test data, was performed as part of comprehensive evaluation and provision of chronic care management services.  SDOH (Social Determinants of Health) assessments and interventions performed: SDOH Interventions    Flowsheet Row Most Recent Value  SDOH Interventions   Food Insecurity Interventions Intervention Not Indicated  Housing Interventions Intervention Not Indicated       Care Plan  No Known Allergies  Medications Reviewed Today     Reviewed by Melissa Montane, RN (Registered Nurse) on 04/05/21 at 1406  Med List Status: <None>   Medication Order Taking? Sig Documenting Provider Last Dose Status Informant  acetaminophen (TYLENOL) 500 MG tablet [id number] Yes Take 500 mg by mouth every 6 (six) hours as needed for mild pain. [provider] Taking Active   fluconazole (DIFLUCAN) 100 MG tablet [id number] No Take 2 tablets today, then 1 tablet daily x 20 more days.  Patient not taking: Reported on 01/18/2021   Eppie Gibson, MD Not Taking Active   HYDROcodone-acetaminophen (HYCET) 7.5-325 mg/15 ml solution [id number] No Take 10-15 mLs by mouth every 4 (four) hours as needed for moderate pain.  Patient not  taking: Reported on 01/18/2021   Gery Pray, MD Not Taking Active   lidocaine (XYLOCAINE) 2 % solution [id number] No Patient: Mix 1part 2% viscous lidocaine, 1part H20. Swish & swallow 71mL of diluted mixture, 66min before meals and at bedtime, up to QID  Patient not taking: Reported on 01/18/2021   Eppie Gibson, MD Not Taking Active   scopolamine (TRANSDERM-SCOP) 1 MG/3DAYS [id number] No Place 1 patch (1.5 mg total) onto the skin every 3 (three) days.  Patient not taking: Reported on 01/18/2021   Eppie Gibson, MD Not Taking Active   senna (SENOKOT) 8.6 MG TABS tablet [id number] No Take 1 tablet (8.6 mg total) by mouth at bedtime as needed for mild constipation.  Patient not taking: Reported on 01/18/2021   Eppie Gibson, MD Not Taking Active   Sodium Fluoride (SODIUM FLUORIDE 5000 PPM) 1.1 % PSTE [id number] No Take 1 application by mouth in the morning and at bedtime.  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[place]) 08/23/2020   Sinus bradycardia    Acute upper GI bleed  02/05/2020   Calculus of gallbladder without cholecystitis without obstruction    Lung nodule < 6cm on CT 07/05/2019   Tobacco use disorder 07/05/2019   Loss of weight 03/05/2019   Anemia 03/05/2019   Abnormal CT of the abdomen 03/05/2019   E coli Pyelonephritis 01/16/2019    Conditions to be addressed/monitored per PCP order:   mouth cancer and knee pain  Care Plan : [place] of Care  Updates made by Melissa Montane, RN since 04/05/2021 12:00 AM     Problem: Health Management needs related to Mouth Cancer and Knee pain      Long-Range Goal: Development of Plan of Care to address Health Management needs related to Mouth Cancer and Knee pain   Start Date: 04/05/2021  Expected End Date: 07/04/2021  Priority: High  Note:   Current Barriers:  Chronic Disease Management support and education needs related to mouth cancer and knee pain  RNCM Clinical Goal(s):  Patient will verbalize understanding of plan for management of mouth cancer and knee pain as evidenced by patient verbalization and self monitoring activities attend all scheduled medical appointments: 05/03/21 @ 9am with Dr. Posey Pronto as evidenced by provider documentation in EMR        demonstrate improved adherence to prescribed treatment plan for mouth cancer and knee pain as evidenced by documentation in EMR continue to work with RN Care Manager and/or Social Worker to address care management and care coordination needs related to mouth cancer and knee pain as evidenced by adherence to CM Team Scheduled appointments     work with [employer] care guide to address needs related to [employer] knowledge of community resource: [employer] accepting [employer] as evidenced by patient and/or community resource care guide support    through collaboration with Consulting civil engineer, provider, and care team.   Interventions: Inter-disciplinary care team collaboration (see longitudinal plan of care) Evaluation of current treatment plan related to  self management and patient's adherence to plan as established by provider Advised patient to attend new patient visit with Dr. Posey Pronto at [facility], provided address and telephone number   Oncology:  (New goal.) Long Term Goal  Assessment of understanding of oncology diagnosis:  Assessed patient understanding of cancer diagnosis and recommended treatment  plan Reviewed upcoming provider appointments and treatment appointments Assessed available transportation to appointments and treatments. Has consistent/reliable transportation: Yes Assessed support system. Has consistent/reliable family or other support: Yes Nutrition assessment performed  Pain:  (Status: New goal.) Long Term Goal  Pain assessment performed Medications reviewed Discussed importance of adherence to all scheduled medical appointments; Counseled on the importance of reporting any/all new or changed pain symptoms or management strategies to pain management provider; Discussed use of relaxation techniques and/or diversional activities to assist with pain reduction (distraction, imagery, relaxation, massage, acupressure, TENS, heat, and cold application; Advised patient to discuss chronic knee pain with provider; Assessed social determinant of health barriers;   Patient Goals/Self-Care Activities: Attend all scheduled provider appointments Attend church or other social activities Perform all self care activities independently  Perform IADL's (shopping, preparing meals, housekeeping, managing finances) independently Call provider office for new concerns or questions        Follow Up:  Patient agrees to Care Plan and Follow-up.  Plan: The Managed Medicaid care management team will reach out to the patient again over the next 30 days.  Date/time of next scheduled RN care management/care coordination outreach:  05/05/21 @ 2:30pm  Threasa Beards  Dyke Maes, BSN [facility]

## 2021-04-26 ENCOUNTER — Other Ambulatory Visit: Payer: Self-pay

## 2021-04-26 NOTE — Patient Outreach (Signed)
Care Coordination  04/26/2021  Tanner Thomas 1955-09-09 [id number]  BSW completed an outreach telephone call to patient. He stated he does not need assistance with an eye doctor, but does need  assistance with something else. Patient stated he was at the grocery store and did not have his paperwork with him. Patient asked for a telephone call back tomorrow.  [name], BSW, [place] Managed Medicaid Team  [phone]

## 2021-04-26 NOTE — Patient Instructions (Signed)
Visit Information  Mr. Volner was given information about Medicaid Managed Care team care coordination services as a part of their [place] Medicaid benefit. [name] verbally consented to engagement with the [facility] Managed Care team.   If you are experiencing a medical emergency, please call 911 or report to your local emergency department or urgent care.   If you have a non-emergency medical problem during routine business hours, please contact your provider's office and ask to speak with a nurse.   For questions related to your [facility], please call: [phone] or visit the homepage here: [email or web address]  If you would like to schedule transportation through your [facility], please call the following number at least 2 days in advance of your appointment: [phone].   Call the [place] at [phone], at any time, 24 hours a day, 7 days a week. If you are in danger or need immediate medical attention call 911.  If you would like help to quit smoking, call 1-800-QUIT-NOW 740-756-1452) OR Espaol: 1-855-Djelo-Ya (9-833-825-0539) o para ms informacin haga clic aqu or Text READY to 200-400 to register via text  Mr. Garguilo - following are the goals we discussed in your visit today:   Goals Addressed   None     Social Worker will contact patient on 04/27/20.   [name], BSW, [place] Managed Medicaid Team  [phone]   Following is a copy of your plan of care:  There are no care plans that you recently modified to display for this patient.

## 2021-04-27 ENCOUNTER — Encounter (HOSPITAL_COMMUNITY): Payer: Self-pay | Admitting: Dentistry

## 2021-04-27 ENCOUNTER — Other Ambulatory Visit: Payer: Self-pay

## 2021-04-27 ENCOUNTER — Ambulatory Visit (INDEPENDENT_AMBULATORY_CARE_PROVIDER_SITE_OTHER): Payer: Medicaid Other | Admitting: Dentistry

## 2021-04-27 VITALS — BP 111/80 | HR 67 | Temp 98.2°F

## 2021-04-27 DIAGNOSIS — Z923 Personal history of irradiation: Secondary | ICD-10-CM | POA: Diagnosis not present

## 2021-04-27 DIAGNOSIS — K0889 Other specified disorders of teeth and supporting structures: Secondary | ICD-10-CM | POA: Insufficient documentation

## 2021-04-27 DIAGNOSIS — K029 Dental caries, unspecified: Secondary | ICD-10-CM | POA: Diagnosis not present

## 2021-04-27 DIAGNOSIS — K045 Chronic apical periodontitis: Secondary | ICD-10-CM | POA: Insufficient documentation

## 2021-04-27 DIAGNOSIS — K030 Excessive attrition of teeth: Secondary | ICD-10-CM | POA: Insufficient documentation

## 2021-04-27 DIAGNOSIS — K056 Periodontal disease, unspecified: Secondary | ICD-10-CM | POA: Insufficient documentation

## 2021-04-27 MED ORDER — SODIUM FLUORIDE 1.1 % DT GEL: DENTAL | 11 refills | Status: AC

## 2021-04-27 NOTE — Patient Outreach (Signed)
Care Coordination  04/27/2021  Tanner Thomas 1955-10-26 [id number]  BSW reached patient and patient's phone went out. BSW contacted patient again and was sent to voicemail. Vm letf.   Medicaid Managed Care   Unsuccessful Outreach Note  04/27/2021 Name: Tanner Thomas MRN: [id number] DOB: 10-14-1955  Referred by: Pcp, No Reason for referral : High Risk Managed Medicaid (MM Social Work )   An unsuccessful telephone outreach was attempted today. The patient was referred to the case management team for assistance with care management and care coordination.   Follow Up Plan: The care management team will reach out to the patient again over the next 7 days.  [name], BSW, [place] Managed Medicaid Team  [phone]

## 2021-04-27 NOTE — Patient Instructions (Signed)
Visit Information  Mr. Tanner Thomas  - as a part of your Medicaid benefit, you are eligible for care management and care coordination services at no cost or copay. I was unable to reach you by phone today but would be happy to help you with your health related needs. Please feel free to call me @ [phone].   A member of the Managed Medicaid care management team will reach out to you again over the next 7 days.   [name], BSW, [place] Managed Medicaid Team  [phone]

## 2021-04-27 NOTE — Patient Instructions (Signed)
[facility] DEPARTMENT OF DENTAL MEDICINE Dr. Debe Coder B. Benson Norway, D.M.D. Phone: [phone] Fax: [phone]       It was a pleasure seeing you today!  Please refer to the information below regarding your dental visit with [place].  Please do not hesitate to give [place] a call if any questions or concerns come up after you leave.    Thank you for letting us provide care for you.  If there is anything we can do for you, please let us know.    FLUORIDE TRAY INSTRUCTIONS    HOW DO I USE MY FLUORIDE TRAYS? The best time to use your Fluoride is in the morning after breakfast, or right before bed time.  You must brush your teeth very well and floss before using the Fluoride in order to get the best use out of the Fluoride treatments. Place 1 drop of Fluoride in each tooth space in the tray.  You do not need to use more. Place the tray(s) over your teeth.  Make sure the trays are seated all the way.  Remember, they only fit one way on your teeth. Clench your teeth together a few times with light chewing motions to distribute the fluoride. Leave the trays in place for a full 5 minutes.  You may drool a bit, so keep a tissue handy. At the end of the 5 minutes, take the trays out.  SPIT OUT the remaining fluoride, but DO NOT RINSE. DO NOT eat or drink after treatments for at least 30 minutes.  This is why the best time for your treatments is before bedtime.    HOW DO I CARE FOR MY FLUORIDE TRAYS? Clean the inside of your Fluoride trays using cold water and a toothbrush. In order to keep your trays from discoloring and free from odors, soak them overnight in denture cleaners or soap and water.  Do not use bleach or non-denture products. Store the trays in a safe dry place AWAY from any heat until your next treatment.  Finally, be sure to let your pharmacy know when you are close to needing a new refill for your fluoride prescription so they have it ready for you without  interruption of fluoride use.       If anything happens to your fluoride trays, or they don't fit as well after any dental work, please let us know as soon as possible.  Call our office during office hours to schedule an appointment at [phone].

## 2021-04-27 NOTE — Progress Notes (Signed)
Department of Dental Medicine    Service Date:   04/27/2021  Patient Name:  Tanner Thomas Date of Birth:   1955-08-28 Medical Record Number: [id number]   TODAY'S VISIT: RESTORATIVE &  FLUORIDE TRAY DELIVERY   DIAGNOSIS: Teeth #10, #11, #12 & #13 caries PROCEDURES: Restorative on #10, #11, #12 & #13 Delivered upper fluoride tray PLAN: Next visit:  #5 restorative & P/ final impression Rx:  Sodium fluoride 1.1% gel to use with fluoride tray       04/27/2021  PROGRESS NOTE:    COVID-19 SCREENING:  The patient denies symptoms concerning for COVID-19 infection including fever, chills, cough, or newly developed shortness of breath.   HISTORY OF PRESENT ILLNESS: Tanner Thomas is a 66 y.o. male who presents today for restorative treatment on teeth numbers 10, 11, 12 and 13 per treatment plan.  Also delivered upper fluoride tray at today's visit. Medical and dental history reviewed with the patient.  No changes reported.   CHIEF COMPLAINT:  Here for a routine dental appointment; patient with no complaints.   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   Encounter for follow-up examination after radiotherapy for malignant neoplasm 01/27/2021   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   Sinus bradycardia    Acute upper GI bleed 02/05/2020   Calculus of gallbladder without cholecystitis without obstruction    Lung nodule < 6cm on CT 07/05/2019   Tobacco use disorder 07/05/2019   Loss  of weight 03/05/2019   Anemia 03/05/2019   Abnormal CT of the abdomen 03/05/2019   E coli Pyelonephritis 01/16/2019   Past Medical History:  Diagnosis Date   Known health problems: none    Past Surgical History:  Procedure Laterality Date   BILIARY DILATION  10/30/2019   Procedure: BILIARY DILATION;  Surgeon: Milus Banister, MD;  Location: [facility];  Service: Endoscopy;;   BILIARY DILATION  02/05/2020   Procedure: BILIARY DILATION;  Surgeon: Milus Banister, MD;  Location[facility];  Service: Endoscopy;;   BILIARY STENT PLACEMENT N/A 10/30/2019   Procedure: BILIARY STENT PLACEMENT;  Surgeon: Milus Banister, MD;  Location: [facility];  Service: Endoscopy;  Laterality: N/A;   BILIARY STENT PLACEMENT N/A 02/05/2020   Procedure: BILIARY STENT PLACEMENT;  Surgeon: Milus Banister, MD;  Location: [facility];  Service: Endoscopy;  Laterality: N/A;   BIOPSY  03/06/2019   Procedure: BIOPSY;  Surgeon: Daneil Dolin, MD;  [facility];  Service: Endoscopy;;  gastric   BIOPSY  02/05/2020   Procedure: BIOPSY;  Surgeon: Milus Banister, MD;  Location: [facility];  Service: Endoscopy;;  bile buct brushing   CHOLECYSTECTOMY N/A 09/05/2019   Procedure: LAPAROSCOPIC CHOLECYSTECTOMY;  Surgeon: Aviva Signs, MD;  Location: [facility];  Service: General;  Laterality: N/A;   COLONOSCOPY WITH PROPOFOL N/A 03/06/2019   Procedure: COLONOSCOPY WITH PROPOFOL;  Surgeon: Daneil Dolin, MD;  [facility];  Service: Endoscopy;  Laterality: N/A;  9:45am - ok at 10:00 per  Melanie   ENDOSCOPIC RETROGRADE CHOLANGIOPANCREATOGRAPHY (ERCP) WITH PROPOFOL N/A 10/30/2019   Procedure: ENDOSCOPIC RETROGRADE CHOLANGIOPANCREATOGRAPHY (ERCP) WITH PROPOFOL;  Surgeon: Milus Banister, MD;  Location: [facility];  Service: Endoscopy;  Laterality: N/A;   ENDOSCOPIC RETROGRADE CHOLANGIOPANCREATOGRAPHY (ERCP) WITH PROPOFOL N/A 02/05/2020   Procedure: ENDOSCOPIC RETROGRADE CHOLANGIOPANCREATOGRAPHY  (ERCP) WITH PROPOFOL;  Surgeon: Milus Banister, MD;  [facility]: [facility];  Service: Endoscopy;  Laterality: N/A;   ESOPHAGOGASTRODUODENOSCOPY (EGD) WITH PROPOFOL N/A 03/06/2019   Procedure: ESOPHAGOGASTRODUODENOSCOPY (EGD) WITH PROPOFOL;  Surgeon: Daneil Dolin, MD;  [facility];  Service: Endoscopy;  Laterality: N/A;   ESOPHAGOGASTRODUODENOSCOPY (EGD) WITH PROPOFOL N/A 08/21/2019   Procedure: ESOPHAGOGASTRODUODENOSCOPY (EGD) WITH PROPOFOL;  Surgeon: Milus Banister, MD;  Location: [facility];  Service: Endoscopy;  Laterality: N/A;   EUS N/A 08/21/2019   Procedure: UPPER ENDOSCOPIC ULTRASOUND (EUS) RADIAL;  Surgeon: Milus Banister, MD;  Location: [facility];  Service: Endoscopy;  Laterality: N/A;   PANCREAS SURGERY     REMOVAL OF STONES  10/30/2019   Procedure: REMOVAL OF STONES;  Surgeon: Milus Banister, MD;  Location: [facility];  Service: Endoscopy;;   REMOVAL OF STONES  02/05/2020   Procedure: REMOVAL OF STONES;  Surgeon: Milus Banister, MD;  Location: [facility];  Service: Endoscopy;;   SPHINCTEROTOMY  10/30/2019   Procedure: [name];  Surgeon: Milus Banister, MD;  Location[facility];  Service: Endoscopy;;   SPHINCTEROTOMY  02/05/2020   Procedure: [name];  Surgeon: Milus Banister, MD;  Location: [facility];  Service: Endoscopy;;   SPYGLASS CHOLANGIOSCOPY N/A 02/05/2020   Procedure: IWLNLGXQ CHOLANGIOSCOPY;  Surgeon: Milus Banister, MD;  [facility]: [facility];  Service: Endoscopy;  Laterality: N/A;   STENT REMOVAL  02/05/2020   Procedure: STENT REMOVAL;  Surgeon: Milus Banister, MD;  Location: [facility];  Service: Endoscopy;;   No Known Allergies Current Outpatient Medications  Medication Sig Dispense Refill   acetaminophen (TYLENOL) 500 MG tablet Take 500 mg by mouth every 6 (six) hours as needed for mild pain.     fluconazole (DIFLUCAN) 100 MG tablet Take 2 tablets today, then 1 tablet daily x 20 more days. (Patient not  taking: Reported on 01/18/2021) 22 tablet 0   HYDROcodone-acetaminophen (HYCET) 7.5-325 mg/15 ml solution Take 10-15 mLs by mouth every 4 (four) hours as needed for moderate pain. (Patient not taking: Reported on 01/18/2021) 473 mL 0   lidocaine (XYLOCAINE) 2 % solution Patient: Mix 1part 2% viscous lidocaine, 1part H20. Swish & swallow 21mL of diluted mixture, 45min before meals and at bedtime, up to QID (Patient not taking: Reported on 01/18/2021) 300 mL 2   scopolamine (TRANSDERM-SCOP) 1 MG/3DAYS Place 1 patch (1.5 mg total) onto the skin every 3 (three) days. (Patient not taking: Reported on 01/18/2021) 10 patch 4   senna (SENOKOT) 8.6 MG TABS tablet Take 1 tablet (8.6 mg total) by mouth at bedtime as needed for mild constipation. (Patient not taking: Reported on 01/18/2021) 30 tablet 1   Sodium Fluoride (SODIUM FLUORIDE 5000 PPM) 1.1 % PSTE Take 1 application by mouth in the morning and at bedtime. (Patient not taking: Reported on 04/05/2021) 96 g 11   No current facility-administered medications for this visit.    LABS: Lab Results  Component Value Date   WBC 8.0 02/05/2020   HGB 13.1 02/06/2020   HCT 40.1 02/06/2020   MCV 94.4 02/05/2020   PLT 112 (L) 02/05/2020      Component Value Date/Time   NA  141 01/17/2021 0804   K 3.2 (L) 01/17/2021 0804   CL 107 01/17/2021 0804   CO2 22 01/17/2021 0804   GLUCOSE 164 (H) 01/17/2021 0804   BUN 6 (L) 01/17/2021 0804   CREATININE 0.81 01/17/2021 0804   CALCIUM 9.2 01/17/2021 0804   GFRNONAA >60 01/17/2021 0804   GFRAA >60 09/03/2019 1459   Lab Results  Component Value Date   INR 1.2 09/03/2019   No results found for: PTT  Social History   Socioeconomic History   Marital status: Single    Spouse name: Not on file   Number of children: Not on file   Years of education: Not on file   Highest education level: Not on file  Occupational History   Not on file  Tobacco Use   Smoking status: Former    Packs/day: 0.00    Years: 50.00     Pack years: 0.00    Types: Cigarettes    Start date: 05/24/1967    Quit date: 06/2020    Years since quitting: 0.8   Smokeless tobacco: Never  Vaping Use   Vaping Use: Never used  Substance and Sexual Activity   Alcohol use: Not Currently   Drug use: Not Currently    Types: Marijuana   Sexual activity: Yes  Other Topics Concern   Not on file  Social History Narrative   Not on file   Social Determinants of Health   Financial Resource Strain: High Risk   Difficulty of Paying Living Expenses: Very hard  Food Insecurity: No Food Insecurity   Worried About Charity fundraiser in the Last Year: Never true   Ran Out of Food in the Last Year: Never true  Transportation Needs: No Transportation Needs   Lack of Transportation (Medical): No   Lack of Transportation (Non-Medical): No  Physical Activity: Not on file  Stress: Not on file  Social Connections: Not on file  Intimate Partner Violence: Not on file   Family History  Problem Relation Age of Onset   Heart disease Father    Colon cancer Neg Hx    Gastric cancer Neg Hx    Esophageal cancer Neg Hx     ANTIBIOTIC PROPHYLAXIS/OTHER PREMEDICATION: None indicated.   VITAL SIGNS: BP 111/80 (BP Location: Right Arm, Patient Position: Sitting, Cuff Size: Normal)    Pulse 67    Temp 98.2 F (36.8 C) (Oral)    ASSESSMENT/INDICATION(S):  Dental caries Patient with history of radiation therapy to the head and neck   PROCEDURES: Restorative treatment on teeth #10 I, #11 I, #12 O and #13 O. Anesthesia: Topical:  Benzocaine 20% applied Type of anesthesia used:  34 mg lidocaine, 0.018 mg epinephrine Location given:  Upper left quadrant infiltration Aspiration negative. Composite restorations: Cotton roll isolation. Excavated decay from #10, #11, #12 and #13. Teeth numbers 10 I, 11 I, 12 O and 13 O prepared for composite. The extent of caries was into dentin. Etched enamel and dentin surfaces with 32% phosphoric acid for 15  seconds and rinsed thoroughly. Removed excess water with a brief burst of air. Optibond bonding agent placed and air dried until no movement of bonding agent was seen and then light cured for 10 seconds. OMNICHROMA flowable composite material placed in increments and light-cured. Removed excess material with carbide finishing bur. Occlusion, margins and contacts were verified and adjusted as needed.  Restorations finished and polished. The patient was advised of possible normal sensitivity to hot and cold for the next  few days/weeks.  Delivery of upper fluoride tray. Appliances were tried in and adjusted as needed.  Polished. Postoperative instructions were provided in a written and verbal format concerning the use and care of appliances. Rx for sodium fluoride 1.1% gel sent in to patient's pharmacy.   PLAN:  Next visit:  #5DO composite & final impression for maxillary acrylic RPD w/ custom tray  All questions and concerns were invited and addressed.  The patient tolerated today's visit well and departed in stable condition.     [place] Benson [place], D.M.D.

## 2021-05-03 ENCOUNTER — Other Ambulatory Visit: Payer: Self-pay

## 2021-05-03 ENCOUNTER — Encounter: Payer: Self-pay | Admitting: Internal Medicine

## 2021-05-03 ENCOUNTER — Ambulatory Visit (INDEPENDENT_AMBULATORY_CARE_PROVIDER_SITE_OTHER): Payer: Medicaid Other | Admitting: Internal Medicine

## 2021-05-03 VITALS — BP 118/88 | HR 99 | Resp 18 | Ht 69.0 in | Wt 121.0 lb

## 2021-05-03 DIAGNOSIS — Z923 Personal history of irradiation: Secondary | ICD-10-CM

## 2021-05-03 DIAGNOSIS — M25561 Pain in right knee: Secondary | ICD-10-CM | POA: Diagnosis not present

## 2021-05-03 DIAGNOSIS — G8929 Other chronic pain: Secondary | ICD-10-CM | POA: Diagnosis not present

## 2021-05-03 DIAGNOSIS — R634 Abnormal weight loss: Secondary | ICD-10-CM | POA: Diagnosis not present

## 2021-05-03 DIAGNOSIS — E559 Vitamin D deficiency, unspecified: Secondary | ICD-10-CM

## 2021-05-03 DIAGNOSIS — D509 Iron deficiency anemia, unspecified: Secondary | ICD-10-CM

## 2021-05-03 DIAGNOSIS — Z23 Encounter for immunization: Secondary | ICD-10-CM | POA: Diagnosis not present

## 2021-05-03 DIAGNOSIS — C048 Malignant neoplasm of overlapping sites of floor of mouth: Secondary | ICD-10-CM | POA: Diagnosis not present

## 2021-05-03 NOTE — Patient Instructions (Signed)
Please continue to eat regular diet as tolerated.  You are being referred to Orthopedic surgeon for right knee pain.

## 2021-05-03 NOTE — Assessment & Plan Note (Signed)
Has had a fall related injury to the right knee Has chronic right knee pain with swelling Takes Tylenol as needed Referred to orthopedic surgeon

## 2021-05-03 NOTE — Assessment & Plan Note (Addendum)
S/p surgery and radiotherapy Followed by ENT and radiation oncology currently Surgical site well-healed Does not have lower teeth, followed by dentist Follows up with dentist for poor dentition as well

## 2021-05-03 NOTE — Assessment & Plan Note (Addendum)
Followed by radiation oncology 

## 2021-05-03 NOTE — Progress Notes (Signed)
New Patient Office Visit  Subjective:  Patient ID: Tanner Thomas, male    DOB: 23-Oct-1955  Age: 66 y.o. MRN: [id number]  CC:  Chief Complaint  Patient presents with   New Patient (Initial Visit)    New patient was being seen in [place] by dr Phillip Heal and dr hall pt needs referral to ortho having right knee pain for awhile     HPI Tanner Thomas is a 66 y.o. male with past medical history of carcinoma of floor of mouth s/p surgery and radiotherapy, anemia and chronic right knee pain who presents for establishing care.  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  He follows up with ENT specialist and radiation oncologist.  He has been doing well overall.  He has been able to tolerate p.o. now, including semisolid food.  He follows up with Dentist as well.  He also has a history of anemia, but has not had recent blood tests done.  He complains of chronic right knee pain.  He reports history of fall on cement floor in the past.  He has had mild swelling in that area as well.  He takes Tylenol as needed for pain.  He has had COVID vaccines.  He received flu vaccine in the office today.  Past Medical History:  Diagnosis Date   Acute upper GI bleed 02/05/2020   Calculus of gallbladder without cholecystitis without obstruction    E coli Pyelonephritis 01/16/2019   Head and neck lymphadenopathy 05/26/2020   Tobacco use disorder 07/05/2019    Past Surgical History:  Procedure Laterality Date   BILIARY DILATION  10/30/2019   Procedure: BILIARY DILATION;  Surgeon: Milus Banister, MD;  Location: [facility];  Service: Endoscopy;;   BILIARY DILATION  02/05/2020   Procedure: BILIARY DILATION;  Surgeon: Milus Banister, MD;  Location: [facility];  Service: Endoscopy;;   BILIARY STENT PLACEMENT N/A 10/30/2019   Procedure: BILIARY STENT PLACEMENT;  Surgeon:  Milus Banister, MD;  Location: [facility];  Service: Endoscopy;  Laterality: N/A;   BILIARY STENT PLACEMENT N/A 02/05/2020   Procedure: BILIARY STENT PLACEMENT;  Surgeon: Milus Banister, MD;  Location: [facility];  Service: Endoscopy;  Laterality: N/A;   BIOPSY  03/06/2019   Procedure: BIOPSY;  Surgeon: Daneil Dolin, MD;  [facility];  Service: Endoscopy;;  gastric   BIOPSY  02/05/2020   Procedure: BIOPSY;  Surgeon: Milus Banister, MD;  Location: [facility];  Service: Endoscopy;;  bile buct brushing   CHOLECYSTECTOMY N/A 09/05/2019   Procedure: LAPAROSCOPIC CHOLECYSTECTOMY;  Surgeon: Aviva Signs, MD;  Location: [facility];  Service: General;  Laterality: N/A;   COLONOSCOPY WITH PROPOFOL N/A 03/06/2019   Procedure: COLONOSCOPY WITH PROPOFOL;  Surgeon: Daneil Dolin, MD;  [facility];  Service: Endoscopy;  Laterality: N/A;  9:45am - ok at 10:00 per Melanie   ENDOSCOPIC RETROGRADE CHOLANGIOPANCREATOGRAPHY (ERCP) WITH PROPOFOL N/A 10/30/2019   Procedure: ENDOSCOPIC RETROGRADE CHOLANGIOPANCREATOGRAPHY (ERCP) WITH PROPOFOL;  Surgeon: Milus Banister, MD;  [facility];  Service: Endoscopy;  Laterality: N/A;   ENDOSCOPIC RETROGRADE CHOLANGIOPANCREATOGRAPHY (ERCP) WITH PROPOFOL N/A 02/05/2020   Procedure: ENDOSCOPIC RETROGRADE CHOLANGIOPANCREATOGRAPHY (ERCP) WITH PROPOFOL;  Surgeon: Milus Banister, MD;  [facility];  Service: Endoscopy;  Laterality: N/A;   ESOPHAGOGASTRODUODENOSCOPY (EGD) WITH PROPOFOL N/A 03/06/2019   Procedure: ESOPHAGOGASTRODUODENOSCOPY (EGD) WITH PROPOFOL;  Surgeon: Daneil Dolin, MD;  [facility];  Service: Endoscopy;  Laterality: N/A;   ESOPHAGOGASTRODUODENOSCOPY (EGD) WITH PROPOFOL N/A 08/21/2019   Procedure: ESOPHAGOGASTRODUODENOSCOPY (EGD) WITH PROPOFOL;  Surgeon: Milus Banister, MD;  [facility];  Service: Endoscopy;  Laterality: N/A;   EUS N/A 08/21/2019   Procedure: UPPER ENDOSCOPIC  ULTRASOUND (EUS) RADIAL;  Surgeon: Milus Banister, MD;  [facility];  Service: Endoscopy;  Laterality: N/A;   PANCREAS SURGERY     REMOVAL OF STONES  10/30/2019   Procedure: REMOVAL OF STONES;  Surgeon: Milus Banister, MD;  [facility]: [facility];  Service: Endoscopy;;   REMOVAL OF STONES  02/05/2020   Procedure: REMOVAL OF STONES;  Surgeon: Milus Banister, MD;  Location: [facility];  Service: Endoscopy;;   SPHINCTEROTOMY  10/30/2019   Procedure: [name];  Surgeon: Milus Banister, MD;  Location: [facility];  Service: Endoscopy;;   SPHINCTEROTOMY  02/05/2020   Procedure: [name];  Surgeon: Milus Banister, MD;  Location: [facility];  Service: Endoscopy;;   SPYGLASS CHOLANGIOSCOPY N/A 02/05/2020   Procedure: WPYKDXIP CHOLANGIOSCOPY;  Surgeon: Milus Banister, MD;  Location: [facility];  Service: Endoscopy;  Laterality: N/A;   STENT REMOVAL  02/05/2020   Procedure: STENT REMOVAL;  Surgeon: Milus Banister, MD;  Location: [facility];  Service: Endoscopy;;    Family History  Problem Relation Age of Onset   Heart disease Father    Colon cancer Neg Hx    Gastric cancer Neg Hx    Esophageal cancer Neg Hx     Social History   Socioeconomic History   Marital status: Single    Spouse name: Not on file   Number of children: Not on file   Years of education: Not on file   Highest education level: Not on file  Occupational History   Not on file  Tobacco Use   Smoking status: Former    Packs/day: 0.00    Years: 50.00    Pack years: 0.00    Types: Cigarettes    Start date: 05/24/1967    Quit date: 06/2020    Years since quitting: 0.8   Smokeless tobacco: Never  Vaping Use   Vaping Use: Never used  Substance and Sexual Activity   Alcohol use: Not Currently   Drug use: Not Currently    Types: Marijuana   Sexual activity: Yes  Other Topics Concern   Not on file  Social History Narrative   Not on file   Social Determinants of Health    Financial Resource Strain: High Risk   Difficulty of Paying Living Expenses: Very hard  Food Insecurity: No Food Insecurity   Worried About Charity fundraiser in the Last Year: Never true   Ran Out of Food in the Last Year: Never true  Transportation Needs: No Transportation Needs   Lack of Transportation (Medical): No   Lack of Transportation (Non-Medical): No  Physical Activity: Not on file  Stress: Not on file  Social Connections: Not on file  Intimate Partner Violence: Not on file    ROS Review of Systems  Constitutional:  Negative for chills and fever.  HENT:  Negative for congestion and sore throat.   Eyes:  Negative for pain and discharge.  Respiratory:  Negative for cough and shortness of breath.   Cardiovascular:  Negative for chest pain and palpitations.  Gastrointestinal:  Negative for constipation, diarrhea, nausea and vomiting.  Endocrine: Negative for polydipsia and polyuria.  Genitourinary:  Negative for dysuria and hematuria.  Musculoskeletal:  Positive for arthralgias. Negative for neck pain and neck stiffness.  Skin:  Negative for rash.  Neurological:  Negative for dizziness, weakness, numbness and headaches.  Psychiatric/Behavioral:  Negative for agitation and behavioral problems.    Objective:   Today's Vitals: BP 118/88 (BP Location: Left Arm, Patient Position: Sitting, Cuff Size: Normal)    Pulse 99    Resp 18    Ht '5\' 9"'  (1.753 m)    Wt 121 lb 0.6 oz (54.9 kg)    SpO2 97%    BMI 17.87 kg/m   Physical Exam Vitals reviewed.  Constitutional:      General: He is not in acute distress.    Appearance: He is not diaphoretic.  HENT:     Head: Normocephalic and atraumatic.     Nose: Nose normal.     Mouth/Throat:     Mouth: Mucous membranes are moist.     Comments: S/p removal of lower teeth Eyes:     General: No scleral icterus.    Extraocular Movements: Extraocular movements intact.  Neck:     Comments: Neck incision site - C/D/I Cardiovascular:      Rate and Rhythm: Normal rate and regular rhythm.     Pulses: Normal pulses.     Heart sounds: Normal heart sounds. No murmur heard. Pulmonary:     Breath sounds: Normal breath sounds. No wheezing or rales.  Musculoskeletal:     Cervical back: Neck supple. No tenderness.     Right lower leg: No edema.     Left lower leg: No edema.  Skin:    General: Skin is warm.     Findings: No rash.  Neurological:     General: No focal deficit present.     Mental Status: He is alert and oriented to person, place, and time.     Sensory: No sensory deficit.     Motor: No weakness.  Psychiatric:        Mood and Affect: Mood normal.        Behavior: Behavior normal.    Assessment & Plan:   Problem List Items Addressed This Visit       Digestive   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 [facility])    S/p surgery and radiotherapy Followed by ENT and radiation oncology currently Surgical site well-healed Does not have lower teeth, followed by dentist Follows up with dentist for poor dentition as well      Relevant Orders   CMP14+EGFR     Other   Iron deficiency anemia - Primary    Last CBC showed Hb near 8 before surgery Check CBC with differential May need iron and folic acid supplements      Relevant Orders   CBC with Differential/Platelet   History of radiation to head and neck region    Followed by radiation oncology      Relevant Orders   CMP14+EGFR   Chronic pain of right knee    Has had a fall related injury to the right knee Has chronic right knee pain with swelling Takes Tylenol as needed Referred to orthopedic surgeon      Relevant Orders   Ambulatory referral to Orthopedic Surgery   Other Visit Diagnoses     Need for immunization against influenza       Relevant Orders   Flu Vaccine QUAD 41moIM (Fluarix, Fluzone & Alfiuria Quad PF) (Completed)   Weight loss  Relevant Orders   TSH   Vitamin D deficiency       Relevant Orders   VITAMIN D 25  Hydroxy (Vit-D Deficiency, Fractures)       Outpatient Encounter Medications as of 05/03/2021  Medication Sig   acetaminophen (TYLENOL) 500 MG tablet Take 500 mg by mouth every 6 (six) hours as needed for mild pain.   lidocaine (XYLOCAINE) 2 % solution Patient: Mix 1part 2% viscous lidocaine, 1part H20. Swish & swallow 25m of diluted mixture, 365m before meals and at bedtime, up to QID   sodium fluoride (SODIUM FLUORIDE 5000 PPM) 1.1 % GEL dental gel Place 1 pea-size drop into each tooth space of fluoride trays once a day at bedtime.  Leave trays in for 5 minutes and then remove.  Spit out excess fluoride, but DO NOT rinse with water, eat or drink for at least 30 minutes after use.   Sodium Fluoride (SODIUM FLUORIDE 5000 PPM) 1.1 % PSTE Take 1 application by mouth in the morning and at bedtime.   [DISCONTINUED] fluconazole (DIFLUCAN) 100 MG tablet Take 2 tablets today, then 1 tablet daily x 20 more days. (Patient not taking: Reported on 01/18/2021)   [DISCONTINUED] HYDROcodone-acetaminophen (HYCET) 7.5-325 mg/15 ml solution Take 10-15 mLs by mouth every 4 (four) hours as needed for moderate pain. (Patient not taking: Reported on 01/18/2021)   [DISCONTINUED] scopolamine (TRANSDERM-SCOP) 1 MG/3DAYS Place 1 patch (1.5 mg total) onto the skin every 3 (three) days. (Patient not taking: Reported on 01/18/2021)   [DISCONTINUED] senna (SENOKOT) 8.6 MG TABS tablet Take 1 tablet (8.6 mg total) by mouth at bedtime as needed for mild constipation. (Patient not taking: Reported on 01/18/2021)   No facility-administered encounter medications on file as of 05/03/2021.    Follow-up: Return in about 3 months (around 08/01/2021) for Anemia.   RuLindell SparMD

## 2021-05-03 NOTE — Assessment & Plan Note (Signed)
Last CBC showed Hb near 8 before surgery Check CBC with differential May need iron and folic acid supplements

## 2021-05-04 ENCOUNTER — Encounter: Payer: Self-pay | Admitting: Internal Medicine

## 2021-05-04 ENCOUNTER — Ambulatory Visit: Payer: Medicaid Other

## 2021-05-04 DIAGNOSIS — E038 Other specified hypothyroidism: Secondary | ICD-10-CM | POA: Insufficient documentation

## 2021-05-04 DIAGNOSIS — E559 Vitamin D deficiency, unspecified: Secondary | ICD-10-CM | POA: Insufficient documentation

## 2021-05-04 LAB — CBC WITH DIFFERENTIAL/PLATELET
Basophils Absolute: 0 10*3/uL (ref 0.0–0.2)
Basos: 0 %
EOS (ABSOLUTE): 0 10*3/uL (ref 0.0–0.4)
Eos: 1 %
Hematocrit: 38.6 % (ref 37.5–51.0)
Hemoglobin: 13 g/dL (ref 13.0–17.7)
Immature Grans (Abs): 0 10*3/uL (ref 0.0–0.1)
Immature Granulocytes: 0 %
Lymphocytes Absolute: 1.1 10*3/uL (ref 0.7–3.1)
Lymphs: 22 %
MCH: 31.9 pg (ref 26.6–33.0)
MCHC: 33.7 g/dL (ref 31.5–35.7)
MCV: 95 fL (ref 79–97)
Monocytes Absolute: 0.5 10*3/uL (ref 0.1–0.9)
Monocytes: 10 %
Neutrophils Absolute: 3.3 10*3/uL (ref 1.4–7.0)
Neutrophils: 67 %
Platelets: 144 10*3/uL — ABNORMAL LOW (ref 150–450)
RBC: 4.08 x10E6/uL — ABNORMAL LOW (ref 4.14–5.80)
RDW: 13.8 % (ref 11.6–15.4)
WBC: 4.9 10*3/uL (ref 3.4–10.8)

## 2021-05-04 LAB — CMP14+EGFR
ALT: 23 IU/L (ref 0–44)
AST: 65 IU/L — ABNORMAL HIGH (ref 0–40)
Albumin/Globulin Ratio: 1.4 (ref 1.2–2.2)
Albumin: 4.4 g/dL (ref 3.8–4.8)
Alkaline Phosphatase: 83 IU/L (ref 44–121)
BUN/Creatinine Ratio: 8 — ABNORMAL LOW (ref 10–24)
BUN: 6 mg/dL — ABNORMAL LOW (ref 8–27)
Bilirubin Total: 0.4 mg/dL (ref 0.0–1.2)
CO2: 19 mmol/L — ABNORMAL LOW (ref 20–29)
Calcium: 9 mg/dL (ref 8.6–10.2)
Chloride: 101 mmol/L (ref 96–106)
Creatinine, Ser: 0.75 mg/dL — ABNORMAL LOW (ref 0.76–1.27)
Globulin, Total: 3.2 g/dL (ref 1.5–4.5)
Glucose: 88 mg/dL (ref 70–99)
Potassium: 4.9 mmol/L (ref 3.5–5.2)
Sodium: 140 mmol/L (ref 134–144)
Total Protein: 7.6 g/dL (ref 6.0–8.5)
eGFR: 101 mL/min/{1.73_m2} (ref >59)

## 2021-05-04 LAB — VITAMIN D 25 HYDROXY (VIT D DEFICIENCY, FRACTURES): Vit D, 25-Hydroxy: 12 ng/mL — ABNORMAL LOW (ref 30.0–100.0)

## 2021-05-04 LAB — TSH: TSH: 4.88 u[IU]/mL — ABNORMAL HIGH (ref 0.450–4.500)

## 2021-05-05 ENCOUNTER — Other Ambulatory Visit: Payer: Self-pay

## 2021-05-05 ENCOUNTER — Other Ambulatory Visit: Payer: Self-pay | Admitting: *Deleted

## 2021-05-05 NOTE — Patient Instructions (Signed)
Visit Information  Tanner Thomas was given information about Medicaid Managed Care team care coordination services as a part of their [place] Medicaid benefit. Tanner Thomas verbally consented to engagement with the [facility] Managed Care team.   If you are experiencing a medical emergency, please call 911 or report to your local emergency department or urgent care.   If you have a non-emergency medical problem during routine business hours, please contact your provider's office and ask to speak with a nurse.   For questions related to your [facility], please call: [phone] or visit the homepage here: [email or web address]  If you would like to schedule transportation through your [facility], please call the following number at least 2 days in advance of your appointment: [phone].   Call the [place] at [phone], at any time, 24 hours a day, 7 days a week. If you are in danger or need immediate medical attention call 911.  If you would like help to quit smoking, call 1-800-QUIT-NOW 9022591000) OR Espaol: 1-855-Djelo-Ya (6-222-979-8921) o para ms informacin haga clic aqu or Text READY to 200-400 to register via text  Tanner Thomas,   Please see education materials related to vit D deficiency, knee pain provided by MyChart link.  Patient has access to MyChart and can view provided education  Telephone follow up appointment with Managed Medicaid care management team member scheduled for:06/06/21 @ 2:30pm  Tanner Joiner RN, BSN [place] RN Care Coordinator   Following is a copy of your plan of care:  Care Plan : RN Care Manager Plan of Care  Updates made by Tanner Montane, RN since 05/05/2021 12:00 AM     Problem: Health Management needs related to Mouth Cancer and Knee pain      Long-Range  Goal: Development of Plan of Care to address Health Management needs related to Mouth Cancer and Knee pain   Start Date: 04/05/2021  Expected End Date: 07/04/2021  Priority: High  Note:   Current Barriers:  Chronic Disease Management support and education needs related to mouth cancer and knee pain Tanner Thomas established care with a new PCP and had a referral to Orthopedics placed. Ortho appointment is 05/10/21. He feels that he may need knee surgery  RNCM Clinical Goal(s):  Patient will verbalize understanding of plan for management of mouth cancer and knee pain as evidenced by patient verbalization and self monitoring activities attend all scheduled medical appointments: 05/03/21 @ 9am with Dr. Posey Pronto as evidenced by provider documentation in EMR        demonstrate improved adherence to prescribed treatment plan for mouth cancer and knee pain as evidenced by documentation in EMR continue to work with RN Care Manager and/or Social Worker to address care management and care coordination needs related to mouth cancer and knee pain as evidenced by adherence to CM Team Scheduled appointments     work with community resource care guide to address needs related to [employer] knowledge of community resource: [employer] accepting [employer] as evidenced by patient and/or community resource care guide support    through collaboration with Consulting civil engineer, provider, and care team.   Interventions: Inter-disciplinary care team collaboration (see longitudinal plan of care) Evaluation of current treatment plan related to  self management and patient's adherence to plan as established by provider Reviewed upcoming appointments: 05/06/21 with MM BSW, 05/10/21 with Orthopedic, 05/18/21 with Oncology  Oncology:  (Goal on Track (progressing): YES.) Long Term Goal  Assessment of understanding of oncology diagnosis:  Reviewed upcoming provider appointments and treatment appointments Assessed available  transportation to appointments and treatments. Has consistent/reliable transportation: Yes Assessed support system. Has consistent/reliable family or other support: Yes Discussed good food choices, including lean meats, fruits and vegetables  Pain:  (Status: Goal on Track (progressing): YES.) Long Term Goal  Pain assessment performed Medications reviewed Discussed importance of adherence to all scheduled medical appointments; Counseled on the importance of reporting any/all new or changed pain symptoms or management strategies to pain management provider; Discussed use of relaxation techniques and/or diversional activities to assist with pain reduction (distraction, imagery, relaxation, massage, acupressure, TENS, heat, and cold application; Advised patient to discuss chronic knee pain with provider; Assessed social determinant of health barriers;   Patient Goals/Self-Care Activities: Attend all scheduled provider appointments Attend church or other social activities Perform all self care activities independently  Perform IADL's (shopping, preparing meals, housekeeping, managing finances) independently Call provider office for new concerns or questions

## 2021-05-06 ENCOUNTER — Other Ambulatory Visit: Payer: Self-pay

## 2021-05-06 NOTE — Patient Instructions (Signed)
Visit Information  Mr. Tanner Thomas  - as a part of your Medicaid benefit, you are eligible for care management and care coordination services at no cost or copay. I was unable to reach you by phone today but would be happy to help you with your health related needs. Please feel free to call me @ [phone]   A member of the Managed Medicaid care management team will reach out to you again over the next 7 days.   [name], BSW, [place] Managed Medicaid Team  [phone]

## 2021-05-06 NOTE — Patient Outreach (Signed)
Care Coordination  05/06/2021  CALEN GEISTER Nov 13, 1955 [id number]   Medicaid Managed Care   Unsuccessful Outreach Note  05/06/2021 Name: LENNON BOUTWELL MRN: [id number] DOB: 02-Oct-1955  Referred by: Lindell Spar, MD Reason for referral : High Risk Managed Medicaid (MM social Work Unsuccessful Telephone Outreach)   An unsuccessful telephone outreach was attempted today. The patient was referred to the case management team for assistance with care management and care coordination.   Follow Up Plan: The care management team will reach out to the patient again over the next 7 days.   [name], BSW, [place] Managed Medicaid Team  [phone]

## 2021-05-10 ENCOUNTER — Other Ambulatory Visit: Payer: Self-pay

## 2021-05-10 ENCOUNTER — Encounter: Payer: Self-pay | Admitting: Orthopaedic Surgery

## 2021-05-10 ENCOUNTER — Ambulatory Visit: Payer: Medicaid Other

## 2021-05-10 ENCOUNTER — Ambulatory Visit: Payer: Medicaid Other | Admitting: Orthopaedic Surgery

## 2021-05-10 VITALS — BP 121/86 | HR 109 | Ht 70.0 in | Wt 120.0 lb

## 2021-05-10 DIAGNOSIS — C048 Malignant neoplasm of overlapping sites of floor of mouth: Secondary | ICD-10-CM

## 2021-05-10 DIAGNOSIS — G8929 Other chronic pain: Secondary | ICD-10-CM

## 2021-05-10 DIAGNOSIS — M25561 Pain in right knee: Secondary | ICD-10-CM

## 2021-05-10 NOTE — Progress Notes (Signed)
Subjective:    Patient ID: Tanner Thomas, male    DOB: 1955-12-27, 66 y.o.   MRN: [id number]  HPI Patient has long history of bilateral knee pain, more on the right. He has swelling and popping and instability and giving way.  He has no trauma.  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  His right knee has become more painful recently.  He has no trauma.  He has developed knock-knee deformity.  He has giving way.  He has weakness.   Review of Systems  Constitutional:  Positive for activity change.  Musculoskeletal:  Positive for arthralgias, gait problem, joint swelling and myalgias.  All other systems reviewed and are negative. For Review of Systems, all other systems reviewed and are negative.  The following is a summary of the past history medically, past history surgically, known current medicines, social history and family history.  This information is gathered electronically by the computer from prior information and documentation.  I review this each visit and have found including this information at this point in the chart is beneficial and informative.   Past Medical History:  Diagnosis Date   Acute upper GI bleed 02/05/2020   Calculus of gallbladder without cholecystitis without obstruction    E coli Pyelonephritis 01/16/2019   Head and neck lymphadenopathy 05/26/2020   Tobacco use disorder 07/05/2019    Past Surgical History:  Procedure Laterality Date   BILIARY DILATION  10/30/2019   Procedure: BILIARY DILATION;  Surgeon: Milus Banister, MD;  Location: [facility];  Service: Endoscopy;;   BILIARY DILATION  02/05/2020   Procedure: BILIARY DILATION;  Surgeon: Milus Banister, MD;  Location: [facility];  Service: Endoscopy;;   BILIARY STENT PLACEMENT N/A 10/30/2019   Procedure: BILIARY STENT PLACEMENT;  Surgeon: Milus Banister, MD;  Location: [facility];  Service: Endoscopy;  Laterality: N/A;    BILIARY STENT PLACEMENT N/A 02/05/2020   Procedure: BILIARY STENT PLACEMENT;  Surgeon: Milus Banister, MD;  Location: [facility];  Service: Endoscopy;  Laterality: N/A;   BIOPSY  03/06/2019   Procedure: BIOPSY;  Surgeon: Daneil Dolin, MD;  [facility];  Service: Endoscopy;;  gastric   BIOPSY  02/05/2020   Procedure: BIOPSY;  Surgeon: Milus Banister, MD;  Location: [facility];  Service: Endoscopy;;  bile buct brushing   CHOLECYSTECTOMY N/A 09/05/2019   Procedure: LAPAROSCOPIC CHOLECYSTECTOMY;  Surgeon: Aviva Signs, MD;  Location: [facility];  Service: General;  Laterality: N/A;   COLONOSCOPY WITH PROPOFOL N/A 03/06/2019   Procedure: COLONOSCOPY WITH PROPOFOL;  Surgeon: Daneil Dolin, MD;  [facility];  Service: Endoscopy;  Laterality: N/A;  9:45am - ok at 10:00 per Melanie   ENDOSCOPIC RETROGRADE CHOLANGIOPANCREATOGRAPHY (ERCP) WITH PROPOFOL N/A 10/30/2019   Procedure: ENDOSCOPIC RETROGRADE CHOLANGIOPANCREATOGRAPHY (ERCP) WITH PROPOFOL;  Surgeon: Milus Banister, MD;  Location: [facility];  Service: Endoscopy;  Laterality: N/A;   ENDOSCOPIC RETROGRADE CHOLANGIOPANCREATOGRAPHY (ERCP) WITH PROPOFOL N/A 02/05/2020   Procedure: ENDOSCOPIC RETROGRADE CHOLANGIOPANCREATOGRAPHY (ERCP) WITH PROPOFOL;  Surgeon: Milus Banister, MD;  Location: [facility];  Service: Endoscopy;  Laterality: N/A;   ESOPHAGOGASTRODUODENOSCOPY (EGD) WITH PROPOFOL N/A 03/06/2019   Procedure: ESOPHAGOGASTRODUODENOSCOPY (EGD) WITH PROPOFOL;  Surgeon: Daneil Dolin, MD;  [facility];  Service: Endoscopy;  Laterality: N/A;   ESOPHAGOGASTRODUODENOSCOPY (EGD) WITH PROPOFOL N/A 08/21/2019   Procedure: ESOPHAGOGASTRODUODENOSCOPY (EGD) WITH PROPOFOL;  Surgeon: Milus Banister, MD;  Location: [facility];  Service: Endoscopy;  Laterality: N/A;   EUS N/A 08/21/2019   Procedure: UPPER ENDOSCOPIC ULTRASOUND (EUS) RADIAL;  Surgeon: Milus Banister, MD;  Location: [facility];  Service:  Endoscopy;  Laterality: N/A;   PANCREAS SURGERY     REMOVAL OF STONES  10/30/2019   Procedure: REMOVAL OF STONES;  Surgeon: Milus Banister, MD;  Location: [facility];  Service: Endoscopy;;   REMOVAL OF STONES  02/05/2020   Procedure: REMOVAL OF STONES;  Surgeon: Milus Banister, MD;  Location: [facility];  Service: Endoscopy;;   SPHINCTEROTOMY  10/30/2019   Procedure: [name];  Surgeon: Milus Banister, MD;  Location: [facility];  Service: Endoscopy;;   SPHINCTEROTOMY  02/05/2020   Procedure: [name];  Surgeon: Milus Banister, MD;  Location: [facility];  Service: Endoscopy;;   SPYGLASS CHOLANGIOSCOPY N/A 02/05/2020   Procedure: CLEXNTZG CHOLANGIOSCOPY;  Surgeon: Milus Banister, MD;  Location: [facility];  Service: Endoscopy;  Laterality: N/A;   STENT REMOVAL  02/05/2020   Procedure: STENT REMOVAL;  Surgeon: Milus Banister, MD;  Location: [facility];  Service: Endoscopy;;    Current Outpatient Medications on File Prior to Visit  Medication Sig Dispense Refill   acetaminophen (TYLENOL) 500 MG tablet Take 500 mg by mouth every 6 (six) hours as needed for mild pain.     cholecalciferol (VITAMIN D3) 25 MCG (1000 UNIT) tablet Take 5,000 Units by mouth daily.     lidocaine (XYLOCAINE) 2 % solution Patient: Mix 1part 2% viscous lidocaine, 1part H20. Swish & swallow 4mL of diluted mixture, 16min before meals and at bedtime, up to QID 300 mL 2   sodium fluoride (SODIUM FLUORIDE 5000 PPM) 1.1 % GEL dental gel Place 1 pea-size drop into each tooth space of fluoride trays once a day at bedtime.  Leave trays in for 5 minutes and then remove.  Spit out excess fluoride, but DO NOT rinse with water, eat or drink for at least 30 minutes after use. 100 mL 11   Sodium Fluoride (SODIUM FLUORIDE 5000 PPM) 1.1 % PSTE Take 1 application by mouth in the morning and at bedtime. 96 g 11   No current facility-administered medications on file prior to visit.    Social History    Socioeconomic History   Marital status: Single    Spouse name: Not on file   Number of children: Not on file   Years of education: Not on file   Highest education level: Not on file  Occupational History   Not on file  Tobacco Use   Smoking status: Former    Packs/day: 0.00    Years: 50.00    Pack years: 0.00    Types: Cigarettes    Start date: 05/24/1967    Quit date: 06/2020    Years since quitting: 0.8   Smokeless tobacco: Never  Vaping Use   Vaping Use: Never used  Substance and Sexual Activity   Alcohol use: Not Currently   Drug use: Not Currently    Types: Marijuana   Sexual activity: Yes  Other Topics Concern   Not on file  Social History Narrative   Not on file   Social Determinants of Health   Financial Resource Strain: High Risk   Difficulty of Paying Living Expenses: Very hard  Food Insecurity: No Food Insecurity   Worried About Running Out of Food in the Last Year: Never true   Ran Out of Food in the Last Year: Never true  Transportation  Needs: No Transportation Needs   Lack of Transportation (Medical): No   Lack of Transportation (Non-Medical): No  Physical Activity: Not on file  Stress: Not on file  Social Connections: Moderately Integrated   Frequency of Communication with Friends and Family: More than three times a week   Frequency of Social Gatherings with Friends and Family: More than three times a week   Attends Religious Services: More than 4 times per year   Active Member of [employer] or Organizations: Yes   Attends Music therapist: More than 4 times per year   Marital Status: Never married  Human resources officer Violence: Not on file    Family History  Problem Relation Age of Onset   Heart disease Father    Colon cancer Neg Hx    Gastric cancer Neg Hx    Esophageal cancer Neg Hx     BP 121/86    Pulse (!) 109    Ht 5\' 10"  (1.778 m)    Wt 120 lb (54.4 kg)    BMI 17.22 kg/m   Body mass index is 17.22 kg/m.     Objective:    Physical Exam Vitals and nursing note reviewed. Exam conducted with a chaperone present.  Constitutional:      Appearance: He is well-developed.  HENT:     Head: Normocephalic and atraumatic.  Eyes:     Conjunctiva/sclera: Conjunctivae normal.     Pupils: Pupils are equal, round, and reactive to light.  Cardiovascular:     Rate and Rhythm: Normal rate and regular rhythm.  Pulmonary:     Effort: Pulmonary effort is normal.  Abdominal:     Palpations: Abdomen is soft.  Musculoskeletal:     Cervical back: Normal range of motion and neck supple.       Legs:  Skin:    General: Skin is warm and dry.  Neurological:     Mental Status: He is alert and oriented to person, place, and time.     Cranial Nerves: No cranial nerve deficit.     Motor: No abnormal muscle tone.     Coordination: Coordination normal.     Deep Tendon Reflexes: Reflexes are normal and symmetric. Reflexes normal.  Psychiatric:        Behavior: Behavior normal.        Thought Content: Thought content normal.        Judgment: Judgment normal.  X-rays were done of the right knee, reported separately.        Assessment & Plan:   Encounter Diagnoses  Name Primary?   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  The right knee was prepped appropriately after time out was performed.   Sterile technique was observed and anesthesia was provided by ethyl chloride and a 20-gauge needle was used to inject the knee area.  A 16-gauge needle was then used to aspirate the knee.  Color of fluid aspirated was straw  Total cc's aspirated was 55.    Injection of 1 cc of DepoMedrol 40 mg with several cc's of plain xylocaine was then performed.  A band aid dressing was applied.  The patient was advised to apply ice later today and tomorrow to the injection sight as needed.   He has an unstable  knee.  He may be candidate for total knee if he gets permission from  family doctor and cancer doctor but it may be best to wait a while.  I have explained this to him.  Return in two weeks.  Call if any problem.  Precautions discussed.  Electronically Signed Sanjuana Kava, MD 1/17/20234:58 PM

## 2021-05-17 ENCOUNTER — Ambulatory Visit: Payer: Self-pay

## 2021-05-17 ENCOUNTER — Telehealth: Payer: Self-pay | Admitting: *Deleted

## 2021-05-17 NOTE — Telephone Encounter (Signed)
Called patient to remind of lab and fu for 05-18-21, spoke with patient 's sister Sorin Frimpong and she is aware of these appts.

## 2021-05-18 ENCOUNTER — Ambulatory Visit
Admission: RE | Admit: 2021-05-18 | Discharge: 2021-05-18 | Disposition: A | Discharge status: Home / Self Care | Payer: Medicaid Other | Source: Ambulatory Visit | Attending: Radiation Oncology | Admitting: Radiation Oncology

## 2021-05-18 ENCOUNTER — Other Ambulatory Visit: Payer: Self-pay

## 2021-05-18 ENCOUNTER — Encounter: Payer: Self-pay | Admitting: Radiation Oncology

## 2021-05-18 VITALS — BP 110/86 | HR 75 | Temp 97.9°F | Resp 18 | Ht 70.0 in | Wt 120.4 lb

## 2021-05-18 DIAGNOSIS — Z923 Personal history of irradiation: Secondary | ICD-10-CM | POA: Diagnosis not present

## 2021-05-18 DIAGNOSIS — R682 Dry mouth, unspecified: Secondary | ICD-10-CM | POA: Diagnosis not present

## 2021-05-18 DIAGNOSIS — Z85810 Personal history of malignant neoplasm of tongue: Secondary | ICD-10-CM | POA: Insufficient documentation

## 2021-05-18 DIAGNOSIS — Z08 Encounter for follow-up examination after completed treatment for malignant neoplasm: Secondary | ICD-10-CM | POA: Diagnosis not present

## 2021-05-18 DIAGNOSIS — Z87891 Personal history of nicotine dependence: Secondary | ICD-10-CM | POA: Diagnosis not present

## 2021-05-18 DIAGNOSIS — C041 Malignant neoplasm of lateral floor of mouth: Secondary | ICD-10-CM | POA: Diagnosis not present

## 2021-05-18 DIAGNOSIS — Z1329 Encounter for screening for other suspected endocrine disorder: Secondary | ICD-10-CM

## 2021-05-18 DIAGNOSIS — M1711 Unilateral primary osteoarthritis, right knee: Secondary | ICD-10-CM | POA: Diagnosis not present

## 2021-05-18 DIAGNOSIS — C049 Malignant neoplasm of floor of mouth, unspecified: Secondary | ICD-10-CM

## 2021-05-18 DIAGNOSIS — C770 Secondary and unspecified malignant neoplasm of lymph nodes of head, face and neck: Secondary | ICD-10-CM | POA: Diagnosis not present

## 2021-05-18 DIAGNOSIS — C048 Malignant neoplasm of overlapping sites of floor of mouth: Secondary | ICD-10-CM

## 2021-05-18 NOTE — Progress Notes (Signed)
Radiation Oncology         [phone] ________________________________  Name: Tanner Thomas MRN: [id number]  Date: 05/18/2021  DOB: [birth year]  Follow-Up Visit Note  CC: Lindell Spar, MD  Lorayne Bender, MD  Diagnosis and Prior Radiotherapy:       ICD-10-CM   1. 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 Lienau presents today for follow-up after completing radiation to his floor of mouth on 10/21/2020  Pain issues, if any: Patient denies Using a feeding tube?: N/A Weight changes, if any:  Wt Readings from Last 3 Encounters:  05/18/21 120 lb 6.4 oz (54.6 kg)  05/10/21 120 lb (54.4 kg)  05/03/21 121 lb 0.6 oz (54.9 kg)   Swallowing issues, if any: Patient denies any issues or concerns. Reports he can eat/drink a widew variety  Smoking or chewing tobacco? None Using fluoride trays daily? N/A--saw Dr. Sandi Mariscal earlier this month and is scheduled for another F/U with her on 05/25/21 Last ENT visit was on: 05/17/2021 Saw Dr. Danice Goltz (resident with Dr. Fenton Malling) "He has no evidence of disease on exam today. He has plans to follow with radiation oncology and is also having partial competed with dentistry. He does have some exposed mandibular bone on his right. We discussed debridement vs observation. We are in favor of observation at this time given that many times surgery on radiated bone can worsen the wound or site, although depending on the extent of dental work he plans to complete we could consider sooner debridement. We discussed that partials he gets should be removable for surveillance.  - 3  month follow up - Can begin alternating 3 month follow up visits with radiation oncology if they wish " Other notable issues, if any: Denies any ear or jaw pain, or difficulty opening his mouth. Continues to deal with dry mouth and occasional thick saliva. Reports altered sense of taste is slowly returning to baseline. Denies any signs or symptoms of lymphedema to his neck. Denies any lingering fatigue or trouble sleeping. Overall, reports he feels well and is pleased with his continued progress                  ALLERGIES:  has No Known Allergies.  Meds: Current Outpatient Medications  Medication Sig Dispense Refill   acetaminophen (TYLENOL) 500 MG tablet Take 500 mg by mouth every 6 (six) hours as needed for mild pain.     cholecalciferol (VITAMIN D3) 25 MCG (1000 UNIT) tablet Take 5,000 Units by mouth daily.     lidocaine (XYLOCAINE) 2 % solution Patient: Mix 1part 2% viscous lidocaine, 1part H20. Swish & swallow 72mL of diluted mixture, 62min before meals and at bedtime, up to QID 300 mL 2   sodium fluoride (SODIUM FLUORIDE 5000 PPM) 1.1 % GEL dental gel Place 1 pea-size drop into each tooth space of fluoride trays once a day at bedtime.  Leave trays in for 5 minutes and then remove.  Spit out excess fluoride, but DO NOT rinse with water, eat or drink for at least 30 minutes after  use. 100 mL 11   Sodium Fluoride (SODIUM FLUORIDE 5000 PPM) 1.1 % PSTE Take 1 application by mouth in the morning and at bedtime. 96 g 11   No current facility-administered medications for this encounter.    Physical Findings: The patient is in no acute distress. Patient is alert and oriented. Wt Readings from Last 3 Encounters:  05/18/21 120 lb 6.4 oz (54.6 kg)  05/10/21 120 lb (54.4 kg)  05/03/21 121 lb 0.6 oz (54.9 kg)    height is 5\' 10"  (1.778 m) and weight is 120 lb 6.4 oz (54.6 kg). His temperature is 97.9 F (36.6 C). His blood pressure is 110/86 and his pulse is 75. His respiration is 18 and oxygen  saturation is 100%. .  General: Alert and oriented, in no acute distress HEENT: Head is normocephalic. Extraocular movements are intact. Oropharynx /mouth without mucosal signs of tumor. There is exposed bone at inner right posterior mandible. Mucosa is moist. Neck: Neck is notable for no masses, +fibrosis. No edema. Skin: Skin in treatment fields shows satisfactory healing  Psychiatric: Judgment and insight are intact. Affect is appropriate. Heart RRR Chest CTAB  Lab Findings: Lab Results  Component Value Date   WBC 4.9 05/03/2021   HGB 13.0 05/03/2021   HCT 38.6 05/03/2021   MCV 95 05/03/2021   PLT 144 (L) 05/03/2021    Lab Results  Component Value Date   TSH 4.880 (H) 05/03/2021    Radiographic Findings: DG Knee 3 Views Right  Result Date: 05/10/2021 Clinical:  Right knee pain, instability, no trauma X-rays were done of the right knee, three views. There is tricompartmental degenerative changes of the right knee, moderate.  An effusion is present.  There is calcification of distal femoral artery and popliteus artery.  There is medial positioning of the knee with more narrowing laterally, giving a knock-knee appearance.  No fracture is noted.  Bone quality is good. Impression:  Tricompartmental degenerative changes of right knee, knock-knee appearance, arteriosclerosis, effusion. Electronically Signed Sanjuana Kava, MD 1/17/20231:50 PM    Impression/Plan:    1) Head and Neck Cancer Status:  NED Exposed bone: will be monitored closely by ENT. He denies vaping, smoking, or chewing tobacco.  2) Nutritional Status: no new issues  Wt Readings from Last 3 Encounters:  05/18/21 120 lb 6.4 oz (54.6 kg)  05/10/21 120 lb (54.4 kg)  05/03/21 121 lb 0.6 oz (54.9 kg)    3) Risk Factors: The patient has been educated about risk factors including alcohol and tobacco abuse; they understand that avoidance of alcohol and tobacco is important to prevent recurrences as well as other  cancers  4) Swallowing: good function, continue SLP exercises  5) Dental: Encouraged to continue regular followup with dentistry, and dental hygiene including fluoride rinses.    6) Thyroid function: TSH slightly elevated - Dr Posey Pronto is watching this. Lab Results  Component Value Date   TSH 4.880 (H) 05/03/2021   7)  Follow-up in 6 mo with me; sees ENT at [facility] in 3 mo   The patient was encouraged to call with any issues or questions before then.  On date of service, in total, I spent 20 minutes on this encounter. Patient was seen in person. _____________________________________   Eppie Gibson, MD

## 2021-05-18 NOTE — Patient Outreach (Signed)
Care Coordination  05/18/2021  SAADIQ POCHE 01-08-1956 [id number]   Medicaid Managed Care   Unsuccessful Outreach Note  05/18/2021 Name: BREVAN LUBERTO MRN: [id number] DOB: 09-24-1955  Referred by: Lindell Spar, MD Reason for referral : High Risk Managed Medicaid (MM Social work unsuccessful telephone outreach)   Third unsuccessful telephone outreach was attempted today. The patient was referred to the case management team for assistance with care management and care coordination. The patient's primary care provider has been notified of our unsuccessful attempts to make or maintain contact with the patient. The care management team is pleased to engage with this patient at any time in the future should he/she be interested in assistance from the care management team.   Follow Up Plan: The patient has been provided with contact information for the care management team and has been advised to call with any health related questions or concerns.   [name], BSW, [place] Managed Medicaid Team  [phone]

## 2021-05-18 NOTE — Progress Notes (Signed)
Mr. Datta presents today for follow-up after completing radiation to his floor of mouth on 10/21/2020  Pain issues, if any: Patient denies Using a feeding tube?: N/A Weight changes, if any:  Wt Readings from Last 3 Encounters:  05/18/21 120 lb 6.4 oz (54.6 kg)  05/10/21 120 lb (54.4 kg)  05/03/21 121 lb 0.6 oz (54.9 kg)   Swallowing issues, if any: Patient denies any issues or concerns. Reports he can eat/drink a widew variety  Smoking or chewing tobacco? None Using fluoride trays daily? N/A--saw Dr. Sandi Mariscal earlier this month and is scheduled for another F/U with her on 05/25/21 Last ENT visit was on: 05/17/2021 Saw Dr. Danice Goltz (resident with Dr. Fenton Malling) "He has no evidence of disease on exam today. He has plans to follow with radiation oncology and is also having partial competed with dentistry. He does have some exposed mandibular bone on his right. We discussed debridement vs observation. We are in favor of observation at this time given that many times surgery on radiated bone can worsen the wound or site, although depending on the extent of dental work he plans to complete we could consider sooner debridement. We discussed that partials he gets should be removable for surveillance.  - 3 month follow up - Can begin alternating 3 month follow up visits with radiation oncology if they wish " Other notable issues, if any: Denies any ear or jaw pain, or difficulty opening his mouth. Continues to deal with dry mouth and occasional thick saliva. Reports altered sense of taste is slowly returning to baseline. Denies any signs or symptoms of lymphedema to his neck. Denies any lingering fatigue or trouble sleeping. Overall, reports he feels well and is pleased with his continued progress

## 2021-05-18 NOTE — Patient Instructions (Signed)
Visit Information  Mr. Tanner Thomas  - as a part of your Medicaid benefit, you are eligible for care management and care coordination services at no cost or copay. I was unable to reach you by phone today but would be happy to help you with your health related needs. Please feel free to call me @ [phone].     [name], BSW, [place] Managed Medicaid Team  [phone]

## 2021-05-19 LAB — TSH: TSH: 4.31 u[IU]/mL — ABNORMAL HIGH (ref 0.320–4.118)

## 2021-05-24 ENCOUNTER — Other Ambulatory Visit: Payer: Self-pay

## 2021-05-24 ENCOUNTER — Encounter: Payer: Self-pay | Admitting: Orthopaedic Surgery

## 2021-05-24 ENCOUNTER — Inpatient Hospital Stay: Payer: Medicaid Other | Attending: Radiation Oncology

## 2021-05-24 ENCOUNTER — Ambulatory Visit (INDEPENDENT_AMBULATORY_CARE_PROVIDER_SITE_OTHER): Payer: Medicaid Other | Admitting: Orthopaedic Surgery

## 2021-05-24 VITALS — Ht 70.0 in | Wt 120.0 lb

## 2021-05-24 DIAGNOSIS — C048 Malignant neoplasm of overlapping sites of floor of mouth: Secondary | ICD-10-CM | POA: Diagnosis present

## 2021-05-24 DIAGNOSIS — Z1329 Encounter for screening for other suspected endocrine disorder: Secondary | ICD-10-CM

## 2021-05-24 DIAGNOSIS — R946 Abnormal results of thyroid function studies: Secondary | ICD-10-CM | POA: Insufficient documentation

## 2021-05-24 DIAGNOSIS — G8929 Other chronic pain: Secondary | ICD-10-CM

## 2021-05-24 DIAGNOSIS — M25561 Pain in right knee: Secondary | ICD-10-CM

## 2021-05-24 LAB — T4, FREE: Free T4: 0.77 ng/dL (ref 0.61–1.12)

## 2021-05-24 NOTE — Progress Notes (Signed)
I am much better.  He has no further swelling of the right knee.  He has good ROM and no pain.  ROM of right knee is 0 to 115, stable, no effusion, NV intact, no limp.  Encounter Diagnosis  Name Primary?   Chronic pain of right knee Yes   I will see him as needed.  Call if any problem.  Precautions discussed.  Electronically Signed Sanjuana Kava, MD 1/31/20231:48 PM

## 2021-05-25 ENCOUNTER — Ambulatory Visit (INDEPENDENT_AMBULATORY_CARE_PROVIDER_SITE_OTHER): Payer: Medicaid Other | Admitting: Dentistry

## 2021-05-25 ENCOUNTER — Encounter (HOSPITAL_COMMUNITY): Payer: Self-pay | Admitting: Dentistry

## 2021-05-25 VITALS — BP 117/78 | HR 55 | Temp 97.7°F

## 2021-05-25 DIAGNOSIS — K029 Dental caries, unspecified: Secondary | ICD-10-CM | POA: Diagnosis not present

## 2021-05-25 DIAGNOSIS — K08109 Complete loss of teeth, unspecified cause, unspecified class: Secondary | ICD-10-CM | POA: Diagnosis not present

## 2021-05-25 NOTE — Progress Notes (Signed)
Department of Dental Medicine    Service Date:   05/25/2021  Patient Name:  Tanner Thomas Date of Birth:   1955/06/26 Medical Record Number: [id number]   TODAY'S VISIT: RESTORATIVE   DIAGNOSIS: Tooth #5 caries PROCEDURES: Restorative on #5 Final impression for upper partial denture PLAN: Next visit:  #7 extraction & upper RPD try-in       05/25/2021  PROGRESS NOTE:    COVID-19 SCREENING:  The patient denies symptoms concerning for COVID-19 infection including fever, chills, cough, or newly developed shortness of breath.   HISTORY OF PRESENT ILLNESS: Tanner Thomas is a 66 y.o. male who presents today for restorative treatment on tooth number 5 per treatment plan, and final impression for upper removable partial denture with custom tray. Medical and dental history reviewed with the patient.  No changes reported.   CHIEF COMPLAINT:  Here for a routine dental appointment; patient with no complaints.   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   Submandibular duct obstruction 05/26/2020   Sinus bradycardia    Lung nodule < 6cm on CT 07/05/2019   Iron deficiency anemia 03/05/2019   Past Medical History:  Diagnosis Date   Acute upper GI bleed 02/05/2020   Calculus of gallbladder without cholecystitis without obstruction    E coli Pyelonephritis 01/16/2019   Head and neck lymphadenopathy 05/26/2020   Tobacco use disorder 07/05/2019   Past Surgical History:  Procedure Laterality Date   BILIARY DILATION  10/30/2019   Procedure: BILIARY DILATION;  Surgeon: Milus Banister, MD;  Location: [facility];   Service: Endoscopy;;   BILIARY DILATION  02/05/2020   Procedure: BILIARY DILATION;  Surgeon: Milus Banister, MD;  Location: [facility];  Service: Endoscopy;;   BILIARY STENT PLACEMENT N/A 10/30/2019   Procedure: BILIARY STENT PLACEMENT;  Surgeon: Milus Banister, MD;  Location: [facility];  Service: Endoscopy;  Laterality: N/A;   BILIARY STENT PLACEMENT N/A 02/05/2020   Procedure: BILIARY STENT PLACEMENT;  Surgeon: Milus Banister, MD;  Location: [facility];  Service: Endoscopy;  Laterality: N/A;   BIOPSY  03/06/2019   Procedure: BIOPSY;  Surgeon: Daneil Dolin, MD;  [facility];  Service: Endoscopy;;  gastric   BIOPSY  02/05/2020   Procedure: BIOPSY;  Surgeon: Milus Banister, MD;  Location: [facility];  Service: Endoscopy;;  bile buct brushing   CHOLECYSTECTOMY N/A 09/05/2019   Procedure: LAPAROSCOPIC CHOLECYSTECTOMY;  Surgeon: Aviva Signs, MD;  Location: [facility];  Service: General;  Laterality: N/A;   COLONOSCOPY WITH PROPOFOL N/A 03/06/2019   Procedure: COLONOSCOPY WITH PROPOFOL;  Surgeon: Daneil Dolin, MD;  [facility];  Service: Endoscopy;  Laterality: N/A;  9:45am - ok at 10:00 per Melanie   ENDOSCOPIC RETROGRADE CHOLANGIOPANCREATOGRAPHY (ERCP) WITH PROPOFOL N/A 10/30/2019   Procedure: ENDOSCOPIC RETROGRADE CHOLANGIOPANCREATOGRAPHY (ERCP) WITH PROPOFOL;  Surgeon: Milus Banister, MD;  Location: [facility];  Service: Endoscopy;  Laterality: N/A;   ENDOSCOPIC RETROGRADE CHOLANGIOPANCREATOGRAPHY (ERCP) WITH PROPOFOL N/A 02/05/2020   Procedure: ENDOSCOPIC RETROGRADE CHOLANGIOPANCREATOGRAPHY (ERCP) WITH PROPOFOL;  Surgeon: Milus Banister, MD;  [facility]: [facility];  Service: Endoscopy;  Laterality: N/A;  ESOPHAGOGASTRODUODENOSCOPY (EGD) WITH PROPOFOL N/A 03/06/2019   Procedure: ESOPHAGOGASTRODUODENOSCOPY (EGD) WITH PROPOFOL;  Surgeon: Daneil Dolin, MD;  [facility];  Service: Endoscopy;  Laterality: N/A;   ESOPHAGOGASTRODUODENOSCOPY  (EGD) WITH PROPOFOL N/A 08/21/2019   Procedure: ESOPHAGOGASTRODUODENOSCOPY (EGD) WITH PROPOFOL;  Surgeon: Milus Banister, MD;  [facility]: [facility];  Service: Endoscopy;  Laterality: N/A;   EUS N/A 08/21/2019   Procedure: UPPER ENDOSCOPIC ULTRASOUND (EUS) RADIAL;  Surgeon: Milus Banister, MD;  Location: [facility];  Service: Endoscopy;  Laterality: N/A;   PANCREAS SURGERY     REMOVAL OF STONES  10/30/2019   Procedure: REMOVAL OF STONES;  Surgeon: Milus Banister, MD;  Location: [facility];  Service: Endoscopy;;   REMOVAL OF STONES  02/05/2020   Procedure: REMOVAL OF STONES;  Surgeon: Milus Banister, MD;  Location: [facility];  Service: Endoscopy;;   SPHINCTEROTOMY  10/30/2019   Procedure: [name];  Surgeon: Milus Banister, MD;  Location: [facility];  Service: Endoscopy;;   SPHINCTEROTOMY  02/05/2020   Procedure: [name];  Surgeon: Milus Banister, MD;  Location: [facility];  Service: Endoscopy;;   SPYGLASS CHOLANGIOSCOPY N/A 02/05/2020   Procedure: JJHERDEY CHOLANGIOSCOPY;  Surgeon: Milus Banister, MD;  Location: [facility];  Service: Endoscopy;  Laterality: N/A;   STENT REMOVAL  02/05/2020   Procedure: STENT REMOVAL;  Surgeon: Milus Banister, MD;  Location: [facility];  Service: Endoscopy;;   No Known Allergies Current Outpatient Medications  Medication Sig Dispense Refill   acetaminophen (TYLENOL) 500 MG tablet Take 500 mg by mouth every 6 (six) hours as needed for mild pain.     cholecalciferol (VITAMIN D3) 25 MCG (1000 UNIT) tablet Take 5,000 Units by mouth daily.     lidocaine (XYLOCAINE) 2 % solution Patient: Mix 1part 2% viscous lidocaine, 1part H20. Swish & swallow 89mL of diluted mixture, 41min before meals and at bedtime, up to QID 300 mL 2   sodium fluoride (SODIUM FLUORIDE 5000 PPM) 1.1 % GEL dental gel Place 1 pea-size drop into each tooth space of fluoride trays once a day at bedtime.  Leave trays in for 5 minutes and then remove.  Spit out  excess fluoride, but DO NOT rinse with water, eat or drink for at least 30 minutes after use. 100 mL 11   Sodium Fluoride (SODIUM FLUORIDE 5000 PPM) 1.1 % PSTE Take 1 application by mouth in the morning and at bedtime. 96 g 11   No current facility-administered medications for this visit.    LABS: Lab Results  Component Value Date   WBC 4.9 05/03/2021   HGB 13.0 05/03/2021   HCT 38.6 05/03/2021   MCV 95 05/03/2021   PLT 144 (L) 05/03/2021      Component Value Date/Time   NA 140 05/03/2021 1005   K 4.9 05/03/2021 1005   CL 101 05/03/2021 1005   CO2 19 (L) 05/03/2021 1005   GLUCOSE 88 05/03/2021 1005   GLUCOSE 164 (H) 01/17/2021 0804   BUN 6 (L) 05/03/2021 1005   CREATININE 0.75 (L) 05/03/2021 1005   CREATININE 0.81 01/17/2021 0804   CALCIUM 9.0 05/03/2021 1005   GFRNONAA >60 01/17/2021 0804   GFRAA >60 09/03/2019 1459   Lab Results  Component Value Date   INR 1.2 09/03/2019   No results found for: PTT  Social History   Socioeconomic History   Marital status: Single    Spouse name: Not on file   Number of children: Not on file  Years of education: Not on file   Highest education level: Not on file  Occupational History   Not on file  Tobacco Use   Smoking status: Former    Packs/day: 0.00    Years: 50.00    Pack years: 0.00    Types: Cigarettes    Start date: 05/24/1967    Quit date: 06/2020    Years since quitting: 0.9   Smokeless tobacco: Never  Vaping Use   Vaping Use: Never used  Substance and Sexual Activity   Alcohol use: Not Currently   Drug use: Not Currently    Types: Marijuana   Sexual activity: Yes  Other Topics Concern   Not on file  Social History Narrative   Not on file   Social Determinants of Health   Financial Resource Strain: High Risk   Difficulty of Paying Living Expenses: Very hard  Food Insecurity: No Food Insecurity   Worried About Charity fundraiser in the Last Year: Never true   Ran Out of Food in the Last Year: Never  true  Transportation Needs: No Transportation Needs   Lack of Transportation (Medical): No   Lack of Transportation (Non-Medical): No  Physical Activity: Not on file  Stress: Not on file  Social Connections: Moderately Integrated   Frequency of Communication with Friends and Family: More than three times a week   Frequency of Social Gatherings with Friends and Family: More than three times a week   Attends Religious Services: More than 4 times per year   Active Member of [employer] or Organizations: Yes   Attends Music therapist: More than 4 times per year   Marital Status: Never married  Human resources officer Violence: Not on file   Family History  Problem Relation Age of Onset   Heart disease Father    Colon cancer Neg Hx    Gastric cancer Neg Hx    Esophageal cancer Neg Hx     ANTIBIOTIC PROPHYLAXIS/OTHER PREMEDICATION: None indicated.   VITAL SIGNS: BP 117/78 (BP Location: Right Arm, Patient Position: Sitting, Cuff Size: Normal)    Pulse (!) 55    Temp 97.7 F (36.5 C) (Oral)    ASSESSMENT/INDICATION(S): Dental caries, missing teeth   PROCEDURES: Restorative treatment on tooth #5 MOD. Border molding and final impression for maxillary acrylic RPD.  Anesthesia: Topical:  Benzocaine 20% applied Type of anesthesia used:  34 mg lidocaine, 0.018 mg epinephrine Location given:  #5 infiltration Aspiration negative. Composite restoration: Cotton roll isolation. Excavated decay from tooth #5. Tooth #5 MOD prepared for composite. The extent of caries was into dentin. Etched enamel and dentin surfaces with 32% phosphoric acid for 15 seconds and rinsed thoroughly. Removed excess water with a brief burst of air. Optibond bonding agent placed and air dried until no movement of bonding agent was seen and then light cured for 10 seconds. OMNICHROMA flowable composite material was placed in increments and light-cured. Removed excess material with carbide finishing bur.  Occlusion, margins and contact were verified and adjusted as needed.  Restoration finished and polished. The patient was advised of possible normal sensitivity to hot and cold for the next few days/weeks.  Final impression: Patient rinsed with 0.12% chlorhexidine gluconate prior to procedure. Customs trays were seated and adjusted as needed.   Edentulous regions and posterior palatal seal were border molded with Heavy Body Genie PVS material. Excess material trimmed away. Reseated custom trays to verify border molding and proper seating. PVS adhesive was added to the  intaglio surfaces of custom trays.   Light Body Genie material was added to the remaining dentition while Medium Body Genie material was added to the custom trays.  Trays were subsequently seated and allowed to set.   Removed custom trays and evaluated for accuracy.  Final impressions appear to have captured all important anatomy and thus deemed adequate. Picked out tooth shade A4 today which was verified by the patient.     PLAN:  Discussed treatment plan with patient today.  Discussed extracting tooth #7 vs restoring it with composite vs doing nothing.  Originally had planned to try to do composite since it is asymptomatic, however after making custom trays for today's impression, #7 is protruded facially and appears to be worsening which would compromise the esthetics and the function of his RPD if we were to leave it.  Explained and discussed this with the patient and he verbalized understanding of treatment options and recommendations.  He elected to proceed with extraction of tooth #7. Discussed how we do not need to wait on extraction to proceed with RPD fabrication and that we can continue as we are with no time constraints.  He verbalized understanding and is agreeable to the updated plan.  Next visit:  Extract #7, maxillary RPD Try-in and Gingival shade selection  All questions and concerns were invited and addressed.  The  patient tolerated today's visit well and departed in stable condition.  Charlaine Dalton, D.M.D.

## 2021-05-27 DIAGNOSIS — K08109 Complete loss of teeth, unspecified cause, unspecified class: Secondary | ICD-10-CM | POA: Insufficient documentation

## 2021-05-27 DIAGNOSIS — K029 Dental caries, unspecified: Secondary | ICD-10-CM | POA: Insufficient documentation

## 2021-05-31 ENCOUNTER — Encounter: Payer: Self-pay | Admitting: Gastroenterology

## 2021-05-31 ENCOUNTER — Ambulatory Visit: Payer: Medicaid Other | Attending: Radiation Oncology

## 2021-05-31 ENCOUNTER — Ambulatory Visit: Payer: Medicaid Other | Admitting: Gastroenterology

## 2021-05-31 DIAGNOSIS — K861 Other chronic pancreatitis: Secondary | ICD-10-CM | POA: Diagnosis not present

## 2021-05-31 DIAGNOSIS — Z4689 Encounter for fitting and adjustment of other specified devices: Secondary | ICD-10-CM | POA: Diagnosis not present

## 2021-05-31 NOTE — Patient Instructions (Addendum)
If you are age 64 or younger, your body mass index should be between 19-25. Your Body mass index is 18.08 kg/m. If this is out of the aformentioned range listed, please consider follow up with your Primary Care Provider.  ________________________________________________________  The [facility] providers would like to encourage you to use [facility] to communicate with providers for non-urgent requests or questions.  Due to long hold times on the telephone, sending your provider a message by [facility] may be a faster and more efficient way to get a response.  Please allow 48 business hours for a response.  Please remember that this is for non-urgent requests.  _______________________________________________________  Tanner Thomas have been scheduled for an ERCP.  Please follow written instructions given to you at your visit today. If you use inhalers (even only as needed), please bring them with you on the day of your procedure.  Due to recent changes in healthcare laws, you may see the results of your imaging and laboratory studies on MyChart before your provider has had a chance to review them.  We understand that in some cases there may be results that are confusing or concerning to you. Not all laboratory results come back in the same time frame and the provider may be waiting for multiple results in order to interpret others.  Please give [place] 48 hours in order for your provider to thoroughly review all the results before contacting the office for clarification of your results.   Thank you for entrusting me with your care and choosing [facility].  Dr Ardis Hughs

## 2021-05-31 NOTE — Therapy (Signed)
[facility] [address] [place], [place], [address] Phone: [phone]   Fax:  [phone]  Patient Details  Name: Tanner Thomas MRN: [id number] Date of Birth: July 02, 1955 Referring Provider:  Eppie Gibson, MD  Encounter Date: 05/31/2021   Pt no-showed his scheduled follow up speech therapy (ST) visit today for assessment of current swallowing function, and accuracy with swallowing HEP.   [place], [place] 05/31/2021, 11:08 AM  [facility] [address] [place], [place], [address] Phone: [phone]   Fax:  [phone]

## 2021-05-31 NOTE — Progress Notes (Signed)
Review of pertinent gastrointestinal problems: 1.  Referred by [facility] gastro[facility] 2020: chronic calcific pancreatitis, etoh related. Recent dilated CBD, referred by Dr. Gala Romney and evauated by EUS 4/235months ago suggested distal CBD debris,sludge; interval lap chole Dr. Arnoldo Morale for cholelithiasis, IOC not performed. Normal LFTs. ERCP 10/2019 Dr. Ardis Hughs with sphincterotomy, (at least partial) stone removal, had some concern for  distal biliary stricture vs. residual stone biliary stent placement 10Fr 5cm long.  Repeat ERCP Dr. Ardis Hughs October 2021 previous biliary stent was removed.  I sampled the distal bile duct stricture with spy bite, ERCP brush cytology, as well as sending the previous stent.  None of these showed atypical cells.  During the procedure sludge and bile debris, small stones were swept from the duct.  A 6 cm long 10 mm diameter fully covered SEMS was placed.  HPI: This is a very pleasant 66 year old man whom I last saw almost a year and a half ago  I performed endoscopic ultrasound as well as 2 ERCPs for him in the year of 2021 after he was referred by [facility] gastroenterology for dilated bile duct.  His liver tests have been normal.  I suspect that all of this was from chronic alcohol related pancreatitis.  I last saw him at the time of an October 2021 ERCP.  He was to have repeat LFTs 1 month later and then follow-up repeat ERCP 3 months or so after that.  We have not heard from him since then.  Blood work January 2023 showed normal liver tests except for AST of 65, hemoglobin 13, platelets 144  Since his last visit he was diagnosed with a head and neck cancer and underwent radiation as well as surgery to his jaw.  He lost quite a lot of weight throughout that but has been recovering quite well.  He is eating without problems.  He talks with just some minor difficulties.  He does not have dysphagia.  He has gained 10 pounds in the last 6 months or so.  As far as he knows he  is cancer free.  His last alcohol drink was about 3 years ago  He has no serious abdominal pains.  No fevers or jaundice that he is aware of.  CT scan chest as follow-up of his head neck cancer September 2022 showed that the previously placed bile duct stent was still in good position.  There was pneumobilia.  This CAT scan again confirmed calcifications throughout his pancreas.   ROS: complete GI ROS as described in HPI, all other review negative.  Constitutional:  No unintentional weight loss   Past Medical History:  Diagnosis Date   Acute upper GI bleed 02/05/2020   Calculus of gallbladder without cholecystitis without obstruction    E coli Pyelonephritis 01/16/2019   Head and neck lymphadenopathy 05/26/2020   Tobacco use disorder 07/05/2019    Past Surgical History:  Procedure Laterality Date   BILIARY DILATION  10/30/2019   Procedure: BILIARY DILATION;  Surgeon: Milus Banister, MD;  Location: [facility];  Service: Endoscopy;;   BILIARY DILATION  02/05/2020   Procedure: BILIARY DILATION;  Surgeon: Milus Banister, MD;  Location: [facility];  Service: Endoscopy;;   BILIARY STENT PLACEMENT N/A 10/30/2019   Procedure: BILIARY STENT PLACEMENT;  Surgeon: Milus Banister, MD;  Location: [facility];  Service: Endoscopy;  Laterality: N/A;   BILIARY STENT PLACEMENT N/A 02/05/2020   Procedure: BILIARY STENT PLACEMENT;  Surgeon: Milus Banister, MD;  Location: [facility];  Service: Endoscopy;  Laterality: N/A;   BIOPSY  03/06/2019   Procedure: BIOPSY;  Surgeon: Daneil Dolin, MD;  Location[facility];  Service: Endoscopy;;  gastric   BIOPSY  02/05/2020   Procedure: BIOPSY;  Surgeon: Milus Banister, MD;  Location: [facility];  Service: Endoscopy;;  bile buct brushing   CHOLECYSTECTOMY N/A 09/05/2019   Procedure: LAPAROSCOPIC CHOLECYSTECTOMY;  Surgeon: Aviva Signs, MD;  Location: [facility];  Service: General;  Laterality: N/A;   COLONOSCOPY WITH PROPOFOL N/A 03/06/2019    Procedure: COLONOSCOPY WITH PROPOFOL;  Surgeon: Daneil Dolin, MD;  [facility];  Service: Endoscopy;  Laterality: N/A;  9:45am - ok at 10:00 per Melanie   ENDOSCOPIC RETROGRADE CHOLANGIOPANCREATOGRAPHY (ERCP) WITH PROPOFOL N/A 10/30/2019   Procedure: ENDOSCOPIC RETROGRADE CHOLANGIOPANCREATOGRAPHY (ERCP) WITH PROPOFOL;  Surgeon: Milus Banister, MD;  Location: [facility];  Service: Endoscopy;  Laterality: N/A;   ENDOSCOPIC RETROGRADE CHOLANGIOPANCREATOGRAPHY (ERCP) WITH PROPOFOL N/A 02/05/2020   Procedure: ENDOSCOPIC RETROGRADE CHOLANGIOPANCREATOGRAPHY (ERCP) WITH PROPOFOL;  Surgeon: Milus Banister, MD;  Location: [facility];  Service: Endoscopy;  Laterality: N/A;   ESOPHAGOGASTRODUODENOSCOPY (EGD) WITH PROPOFOL N/A 03/06/2019   Procedure: ESOPHAGOGASTRODUODENOSCOPY (EGD) WITH PROPOFOL;  Surgeon: Daneil Dolin, MD;  [facility];  Service: Endoscopy;  Laterality: N/A;   ESOPHAGOGASTRODUODENOSCOPY (EGD) WITH PROPOFOL N/A 08/21/2019   Procedure: ESOPHAGOGASTRODUODENOSCOPY (EGD) WITH PROPOFOL;  Surgeon: Milus Banister, MD;  Location: [facility];  Service: Endoscopy;  Laterality: N/A;   EUS N/A 08/21/2019   Procedure: UPPER ENDOSCOPIC ULTRASOUND (EUS) RADIAL;  Surgeon: Milus Banister, MD;  Location: [facility];  Service: Endoscopy;  Laterality: N/A;   PANCREAS SURGERY     REMOVAL OF STONES  10/30/2019   Procedure: REMOVAL OF STONES;  Surgeon: Milus Banister, MD;  Location: [facility];  Service: Endoscopy;;   REMOVAL OF STONES  02/05/2020   Procedure: REMOVAL OF STONES;  Surgeon: Milus Banister, MD;  Location: [facility];  Service: Endoscopy;;   SPHINCTEROTOMY  10/30/2019   Procedure: [name];  Surgeon: Milus Banister, MD;  Location: [facility];  Service: Endoscopy;;   SPHINCTEROTOMY  02/05/2020   Procedure: [name];  Surgeon: Milus Banister, MD;  Location: [facility];  Service: Endoscopy;;   SPYGLASS CHOLANGIOSCOPY N/A 02/05/2020    Procedure: IFOYDXAJ CHOLANGIOSCOPY;  Surgeon: Milus Banister, MD;  Location: [facility];  Service: Endoscopy;  Laterality: N/A;   STENT REMOVAL  02/05/2020   Procedure: STENT REMOVAL;  Surgeon: Milus Banister, MD;  Location: [facility];  Service: Endoscopy;;    Current Outpatient Medications  Medication Sig Dispense Refill   acetaminophen (TYLENOL) 500 MG tablet Take 500 mg by mouth every 6 (six) hours as needed for mild pain.     cholecalciferol (VITAMIN D3) 25 MCG (1000 UNIT) tablet Take 5,000 Units by mouth daily.     lidocaine (XYLOCAINE) 2 % solution Patient: Mix 1part 2% viscous lidocaine, 1part H20. Swish & swallow 63mL of diluted mixture, 51min before meals and at bedtime, up to QID 300 mL 2   sodium fluoride (SODIUM FLUORIDE 5000 PPM) 1.1 % GEL dental gel Place 1 pea-size drop into each tooth space of fluoride trays once a day at bedtime.  Leave trays in for 5 minutes and then remove.  Spit out excess fluoride, but DO NOT rinse with water, eat or drink for at least 30 minutes after use. 100 mL 11   Sodium Fluoride (SODIUM FLUORIDE 5000 PPM) 1.1 % PSTE Take 1 application by mouth in the  morning and at bedtime. 96 g 11   No current facility-administered medications for this visit.    Allergies as of 05/31/2021   (No Known Allergies)    Family History  Problem Relation Age of Onset   Heart disease Father    Colon cancer Neg Hx    Gastric cancer Neg Hx    Esophageal cancer Neg Hx     Social History   Socioeconomic History   Marital status: Single    Spouse name: Not on file   Number of children: Not on file   Years of education: Not on file   Highest education level: Not on file  Occupational History   Not on file  Tobacco Use   Smoking status: Former    Packs/day: 0.00    Years: 50.00    Pack years: 0.00    Types: Cigarettes    Start date: 05/24/1967    Quit date: 06/2020    Years since quitting: 0.9   Smokeless tobacco: Never  Vaping Use   Vaping Use:  Never used  Substance and Sexual Activity   Alcohol use: Not Currently   Drug use: Not Currently    Types: Marijuana   Sexual activity: Yes  Other Topics Concern   Not on file  Social History Narrative   Not on file   Social Determinants of Health   Financial Resource Strain: High Risk   Difficulty of Paying Living Expenses: Very hard  Food Insecurity: No Food Insecurity   Worried About Charity fundraiser in the Last Year: Never true   Ran Out of Food in the Last Year: Never true  Transportation Needs: No Transportation Needs   Lack of Transportation (Medical): No   Lack of Transportation (Non-Medical): No  Physical Activity: Not on file  Stress: Not on file  Social Connections: Moderately Integrated   Frequency of Communication with Friends and Family: More than three times a week   Frequency of Social Gatherings with Friends and Family: More than three times a week   Attends Religious Services: More than 4 times per year   Active Member of [employer] or Organizations: Yes   Attends Music therapist: More than 4 times per year   Marital Status: Never married  Intimate Partner Violence: Not on file     Physical Exam: BP 102/62    Pulse 64    Ht 5\' 10"  (1.778 m)    Wt 126 lb (57.2 kg)    BMI 18.08 kg/m  Constitutional: generally well-appearing Psychiatric: alert and oriented x3 Abdomen: soft, nontender, nondistended, no obvious ascites, no peritoneal signs, normal bowel sounds No peripheral edema noted in lower extremities  Assessment and plan: 66 y.o. 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  Previous ERCP was October 2021 at which point I put a 10 mm diameter fully covered metal stent.  It appears to still be in position based on chest CT 5 months ago.  He has no symptoms to suggest underlying pancreatic cancer, he stopped drinking 3 years ago.  I explained to him  that we should probably remove the previously placed metal biliary stent and inspect his bile duct, sweep it to see if any stones or biodebris has built up.  Hopefully he could be left without another stent.  We will therefore proceed with ERCP at his soonest convenience.  Please see the "Patient Instructions" section for addition  details about the plan.  Owens Loffler, MD [facility] 05/31/2021, 2:17 PM   Total time on date of encounter was 35 minutes (this included time spent preparing to see the patient reviewing records; obtaining and/or reviewing separately obtained history; performing a medically appropriate exam and/or evaluation; counseling and educating the patient and family if present; ordering medications, tests or procedures if applicable; and documenting clinical information in the health record).

## 2021-06-06 ENCOUNTER — Other Ambulatory Visit: Payer: Self-pay | Admitting: *Deleted

## 2021-06-06 NOTE — Patient Outreach (Signed)
Care Coordination  06/06/2021  Tanner Thomas 02-28-1956 [id number]   Medicaid Managed Care   Unsuccessful Outreach Note  06/06/2021 Name: Tanner Thomas MRN: [id number] DOB: [birth year]  Referred by: Lindell Spar, MD Reason for referral : High Risk Managed Medicaid (Unsuccessful RNCM follow up telephone outreach)   An unsuccessful telephone outreach was attempted today. The patient was referred to the case management team for assistance with care management and care coordination.   Follow Up Plan: A HIPAA compliant phone message was left for the patient providing contact information and requesting a return call.   [name] RN, BSN [place] RN Care Coordinator

## 2021-06-06 NOTE — Patient Instructions (Signed)
Visit Information  Mr. JERIN FRANZEL  - as a part of your Medicaid benefit, you are eligible for care management and care coordination services at no cost or copay. I was unable to reach you by phone today but would be happy to help you with your health related needs. Please feel free to call me @ [phone].   A member of the Managed Medicaid care management team will reach out to you again over the next 14 days.   [name] RN, BSN [place] RN Care Coordinator

## 2021-06-08 ENCOUNTER — Other Ambulatory Visit: Payer: Self-pay

## 2021-06-08 ENCOUNTER — Encounter (HOSPITAL_COMMUNITY): Payer: Self-pay | Admitting: Dentistry

## 2021-06-08 ENCOUNTER — Ambulatory Visit (INDEPENDENT_AMBULATORY_CARE_PROVIDER_SITE_OTHER): Payer: Medicaid Other | Admitting: Dentistry

## 2021-06-08 VITALS — BP 114/85 | HR 80 | Temp 98.4°F

## 2021-06-08 DIAGNOSIS — K0530 Chronic periodontitis, unspecified: Secondary | ICD-10-CM

## 2021-06-08 DIAGNOSIS — K0889 Other specified disorders of teeth and supporting structures: Secondary | ICD-10-CM

## 2021-06-08 DIAGNOSIS — K045 Chronic apical periodontitis: Secondary | ICD-10-CM | POA: Insufficient documentation

## 2021-06-08 DIAGNOSIS — K08109 Complete loss of teeth, unspecified cause, unspecified class: Secondary | ICD-10-CM

## 2021-06-08 DIAGNOSIS — K029 Dental caries, unspecified: Secondary | ICD-10-CM

## 2021-06-08 NOTE — Patient Instructions (Signed)
[facility] DEPARTMENT OF DENTAL MEDICINE Dr. Antonius Hartlage B. Teresha Hanks, D.M.D. Phone: [phone] Fax: [phone]   MOUTH CARE AFTER SURGERY    FACTS: Ice used in ice bag helps keep the swelling down, and can help lessen the pain for the first 24 hours after surgery. It is easier to treat pain BEFORE it happens. Spitting disturbs the clot and may cause bleeding to start again, or to get worse. Smoking delays healing and can cause complications. Sharing prescriptions can be dangerous.  Do not take medications not recently prescribed for you. Antibiotics may stop birth control pills from working.  Use other means of birth control while on antibiotics. Warm salt water rinses after the first 24 hours will help lessen the swelling:  Use 1/2 teaspoonful of table salt per oz.of water.    DO NOT: Spit Drink through a straw It is strongly advised not to smoke, dip snuff or chew tobacco for at least 3 days. Eat sharp or crunchy foods.  Avoid the area of surgery when chewing. Stop your antibiotics before your instructions say to do so. Eat hot foods until bleeding has stopped.  If you need to, let your food cool down to room temperature.      WHAT TO EXPECT: Some swelling, especially during the first 2-3 days. Soreness or discomfort in varying degrees.  Follow your dentist's instructions about how to handle pain before it starts. Pinkish saliva or light blood in saliva, or on your pillow in the morning.  This can last around 24 hours. Bruising inside or outside the mouth.  This may not show up until 2-3 days after surgery.  Don't worry, it will go away in time. Pieces of "bone" may work themselves loose.  It's OK.  If they bother you, let us know.     WHAT TO DO IMMEDIATELY AFTER SURGERY: Bite on gauze with steady pressure for 30-45 minutes at a time.  Switch out the gauze after 30-45 minutes for clean gauze, and continue this for 1-2 hours or until bleeding  subsides. Do not chew on the gauze. Do not lie down flat.  Raise your head support especially for the first 24 hours. Apply ice to your face on the side of the surgery.  You may apply it 20 minutes on and a few minutes off.  Ice for 8-12 hours.  You may use ice up to 24 hours. Before the numbness wears off, take a pain pill as instructed. Prescription pain medication is not always required.     SWELLING: Expect swelling for the first couple of days.  It should get better after that. If swelling increases 3 days or so after surgery, let us know as soon as possible.    FEVER: Take Tylenol every 4 hours if needed to lower your temperature, especially if it is at 100oF or higher. Drink lots of fluids. If the fever does not go away, let us know.    BREATHING: Any unusual difficulty breathing means you have to have someone bring you to the emergency room ASAP.    BLEEDING: Light oozing is expected for 24 hours or so. Prop head up with pillows. Do not spit. Do not confuse bright red fresh flowing blood with lots of saliva colored with a little bit of blood. If you notice some bleeding, place gauze or a tea bag where it is bleeding and apply CONSTANT pressure by biting down for 1 hour.  Avoid talking during this time.  Do not   remove the gauze or tea bag during this hour to "check" the bleeding. If you notice bright RED bleeding FLOWING out of particular area, and filling the floor of your mouth, put a wad of gauze on that area, bite down firmly and constantly.  Call us immediately.  If we're closed, have someone bring you to the emergency room.     ORAL HYGIENE: Brush your teeth as usual after meals and before bedtime. Use a soft toothbrush around the area of surgery. DO NOT AVOID BRUSHING.  Otherwise bacteria(germs) will grow and may delay healing or encourage infection. Since you cannot spit, just gently rinse and let the water flow out of your mouth. DO NOT SWISH HARD.      EATING: Cool liquids are a good point to start.  Increase to soft foods as tolerated.     PRESCRIPTIONS: Follow the directions for your prescriptions exactly as written. If your doctor gave you a narcotic pain medication, do not drive, operate machinery or drink alcohol when on that medication.    Questions?  Call our office during office hours at [phone] or call the Emergency Room at [phone].  

## 2021-06-08 NOTE — Progress Notes (Signed)
Department of Dental Medicine   Service Date:    06/08/2021  Patient Name:   Tanner Thomas Date of Birth:    Jun 05, 1955 Medical Record Number:  [id number]   TODAY'S VISIT: EXTRACTION & WAX TRY-IN   INDICATIONS/DIAGNOSES: Tooth #7 severe caries, poor periodontal prognosis Partially edentulous maxilla PROCEDURES: Extraction of tooth #7 P/ wax try-in PLAN: Take Tylenol as needed for pain. Starting tomorrow (2/16) rinse with warm salt water 2-3 times daily.  Bite firmly on gauze applying pressure to site for 30-45 minutes at a time for any bleeding or oozing. Call if any questions or concerns arise before next visit.    NV:  Postop & Maxillary RPD delivery         06/08/2021    PROGRESS NOTE:   HISTORY OF PRESENT ILLNESS: Tanner Thomas is a 66 y.o. male who presents today for extraction of tooth number 7 and wax try-in of upper acrylic RPD. Medical and dental history reviewed with the patient.  No reported changes.   CHIEF COMPLAINT: Here for extraction and denture visit per treatment plan; the patient has no complaints.   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[place]) 08/23/2020   Submandibular duct obstruction 05/26/2020   Sinus bradycardia    Lung nodule < 6cm on CT 07/05/2019   Iron deficiency anemia 03/05/2019   Past Medical History:  Diagnosis Date   Acute upper GI bleed 02/05/2020   Calculus of gallbladder without cholecystitis without obstruction    E coli Pyelonephritis 01/16/2019    Head and neck lymphadenopathy 05/26/2020   Tobacco use disorder 07/05/2019   Past Surgical History:  Procedure Laterality Date   BILIARY DILATION  10/30/2019   Procedure: BILIARY DILATION;  Surgeon: Milus Banister, MD;  Location: [facility];  Service: Endoscopy;;   BILIARY DILATION  02/05/2020   Procedure: BILIARY DILATION;  Surgeon: Milus Banister, MD;  Location: [facility];  Service: Endoscopy;;   BILIARY STENT PLACEMENT N/A 10/30/2019   Procedure: BILIARY STENT PLACEMENT;  Surgeon: Milus Banister, MD;  Location: [facility];  Service: Endoscopy;  Laterality: N/A;   BILIARY STENT PLACEMENT N/A 02/05/2020   Procedure: BILIARY STENT PLACEMENT;  Surgeon: Milus Banister, MD;  Location: [facility];  Service: Endoscopy;  Laterality: N/A;   BIOPSY  03/06/2019   Procedure: BIOPSY;  Surgeon: Daneil Dolin, MD;  [facility];  Service: Endoscopy;;  gastric   BIOPSY  02/05/2020   Procedure: BIOPSY;  Surgeon: Milus Banister, MD;  Location: [facility];  Service: Endoscopy;;  bile buct brushing   CHOLECYSTECTOMY N/A 09/05/2019   Procedure: LAPAROSCOPIC CHOLECYSTECTOMY;  Surgeon: Aviva Signs, MD;  Location: [facility];  Service: General;  Laterality: N/A;   COLONOSCOPY WITH PROPOFOL N/A 03/06/2019   Procedure: COLONOSCOPY WITH PROPOFOL;  Surgeon: Daneil Dolin, MD;  [facility];  Service: Endoscopy;  Laterality: N/A;  9:45am - ok at 10:00 per [facility]   ENDOSCOPIC RETROGRADE CHOLANGIOPANCREATOGRAPHY (ERCP) WITH PROPOFOL N/A 10/30/2019   Procedure: ENDOSCOPIC RETROGRADE CHOLANGIOPANCREATOGRAPHY (  ERCP) WITH PROPOFOL;  Surgeon: Milus Banister, MD;  Location: [facility];  Service: Endoscopy;  Laterality: N/A;   ENDOSCOPIC RETROGRADE CHOLANGIOPANCREATOGRAPHY (ERCP) WITH PROPOFOL N/A 02/05/2020   Procedure: ENDOSCOPIC RETROGRADE CHOLANGIOPANCREATOGRAPHY (ERCP) WITH PROPOFOL;  Surgeon: Milus Banister, MD;  [facility]: [facility];  Service: Endoscopy;  Laterality: N/A;    ESOPHAGOGASTRODUODENOSCOPY (EGD) WITH PROPOFOL N/A 03/06/2019   Procedure: ESOPHAGOGASTRODUODENOSCOPY (EGD) WITH PROPOFOL;  Surgeon: Daneil Dolin, MD;  [facility];  Service: Endoscopy;  Laterality: N/A;   ESOPHAGOGASTRODUODENOSCOPY (EGD) WITH PROPOFOL N/A 08/21/2019   Procedure: ESOPHAGOGASTRODUODENOSCOPY (EGD) WITH PROPOFOL;  Surgeon: Milus Banister, MD;  [facility]: [facility];  Service: Endoscopy;  Laterality: N/A;   EUS N/A 08/21/2019   Procedure: UPPER ENDOSCOPIC ULTRASOUND (EUS) RADIAL;  Surgeon: Milus Banister, MD;  Location: [facility];  Service: Endoscopy;  Laterality: N/A;   PANCREAS SURGERY     REMOVAL OF STONES  10/30/2019   Procedure: REMOVAL OF STONES;  Surgeon: Milus Banister, MD;  Location: [facility];  Service: Endoscopy;;   REMOVAL OF STONES  02/05/2020   Procedure: REMOVAL OF STONES;  Surgeon: Milus Banister, MD;  Location: [facility];  Service: Endoscopy;;   SPHINCTEROTOMY  10/30/2019   Procedure: [name];  Surgeon: Milus Banister, MD;  Location: [facility];  Service: Endoscopy;;   SPHINCTEROTOMY  02/05/2020   Procedure: [name];  Surgeon: Milus Banister, MD;  Location: [facility];  Service: Endoscopy;;   SPYGLASS CHOLANGIOSCOPY N/A 02/05/2020   Procedure: XNATFTDD CHOLANGIOSCOPY;  Surgeon: Milus Banister, MD;  Location: [facility];  Service: Endoscopy;  Laterality: N/A;   STENT REMOVAL  02/05/2020   Procedure: STENT REMOVAL;  Surgeon: Milus Banister, MD;  Location: [facility];  Service: Endoscopy;;   No Known Allergies Current Outpatient Medications  Medication Sig Dispense Refill   acetaminophen (TYLENOL) 500 MG tablet Take 500 mg by mouth every 6 (six) hours as needed for mild pain.     cholecalciferol (VITAMIN D3) 25 MCG (1000 UNIT) tablet Take 5,000 Units by mouth daily.     lidocaine (XYLOCAINE) 2 % solution Patient: Mix 1part 2% viscous lidocaine, 1part H20. Swish & swallow 33mL of diluted mixture, 41min before  meals and at bedtime, up to QID 300 mL 2   sodium fluoride (SODIUM FLUORIDE 5000 PPM) 1.1 % GEL dental gel Place 1 pea-size drop into each tooth space of fluoride trays once a day at bedtime.  Leave trays in for 5 minutes and then remove.  Spit out excess fluoride, but DO NOT rinse with water, eat or drink for at least 30 minutes after use. 100 mL 11   Sodium Fluoride (SODIUM FLUORIDE 5000 PPM) 1.1 % PSTE Take 1 application by mouth in the morning and at bedtime. 96 g 11   No current facility-administered medications for this visit.    LABS: Lab Results  Component Value Date   WBC 4.9 05/03/2021   HGB 13.0 05/03/2021   HCT 38.6 05/03/2021   MCV 95 05/03/2021   PLT 144 (L) 05/03/2021      Component Value Date/Time   NA 140 05/03/2021 1005   K 4.9 05/03/2021 1005   CL 101 05/03/2021 1005   CO2 19 (L) 05/03/2021 1005   GLUCOSE 88 05/03/2021 1005   GLUCOSE 164 (H) 01/17/2021 0804   BUN 6 (L) 05/03/2021 1005   CREATININE 0.75 (L) 05/03/2021 1005   CREATININE 0.81 01/17/2021 0804   CALCIUM 9.0 05/03/2021 1005   GFRNONAA >60 01/17/2021 0804  GFRAA >60 09/03/2019 1459   Lab Results  Component Value Date   INR 1.2 09/03/2019   No results found for: PTT  Social History   Socioeconomic History   Marital status: Single    Spouse name: Not on file   Number of children: Not on file   Years of education: Not on file   Highest education level: Not on file  Occupational History   Not on file  Tobacco Use   Smoking status: Former    Packs/day: 0.00    Years: 50.00    Pack years: 0.00    Types: Cigarettes    Start date: 05/24/1967    Quit date: 06/2020    Years since quitting: 0.9   Smokeless tobacco: Never  Vaping Use   Vaping Use: Never used  Substance and Sexual Activity   Alcohol use: Not Currently   Drug use: Not Currently    Types: Marijuana   Sexual activity: Yes  Other Topics Concern   Not on file  Social History Narrative   Not on file   Social Determinants  of Health   Financial Resource Strain: High Risk   Difficulty of Paying Living Expenses: Very hard  Food Insecurity: No Food Insecurity   Worried About Charity fundraiser in the Last Year: Never true   Ran Out of Food in the Last Year: Never true  Transportation Needs: No Transportation Needs   Lack of Transportation (Medical): No   Lack of Transportation (Non-Medical): No  Physical Activity: Not on file  Stress: Not on file  Social Connections: Moderately Integrated   Frequency of Communication with Friends and Family: More than three times a week   Frequency of Social Gatherings with Friends and Family: More than three times a week   Attends Religious Services: More than 4 times per year   Active Member of [employer] or Organizations: Yes   Attends Music therapist: More than 4 times per year   Marital Status: Never married  Human resources officer Violence: Not on file   Family History  Problem Relation Age of Onset   Heart disease Father    Colon cancer Neg Hx    Gastric cancer Neg Hx    Esophageal cancer Neg Hx     ANTIBIOTIC PROPHYLAXIS OR OTHER PREMEDICATION: [None indicated]   VITAL SIGNS: Preoperative:  BP 114/85 (BP Location: Right Arm, Patient Position: Sitting, Cuff Size: Normal)    Pulse 80    Temp 98.4 F (36.9 C) (Oral)  Postoperative:  113/87 mmHg   ASSESSMENT: Chronic apical periodontitis, caries (tooth #7) Partially edentulous maxilla   PROCEDURES: Routine extraction of tooth #7.  Pre-procedure: Time out completed: verified patient's name, DOB and tooth to be extracted. Surgical consent: reviewed with the patient and signed.  Leta Speller, DAII witnessed consent. Anesthesia: Topical:  Benzocaine 20% applied Type of anesthesia used:  34 mg lidocaine, 0.018 mg epinephrine Location given:  #7 infiltration and palatal Aspiration negative. Extraction: Throat pack placed with 4 x 4 gauze. Attention turned to tooth #7.  Soft tissue reflection with  periosteal elevator.  Elevated sequentially with small, medium and large elevators.   Delivered tooth #7 with 150 forceps without complications.  The throat pack was then removed. Socket was then curetted and irrigated with copious amounts of sterile saline.  3-0 chromic gut sutures were used in a simple interrupted fashion to close distal papilla.  Good hemostasis was achieved with gauze pressure.   Postoperative instructions were given in  a verbal and written format.   Recommend over-the-counter Ibuprofen and/or Tylenol to take as needed for pain. Instructed the patient not to consume with alcohol and to not take additional acetaminophen (Tylenol) when taking.    Wax-try in for maxillary acrylic removable partial denture. Tried in maxillary wax try-in dentures.  Verified plane of occlusion.  Maxillary plane of occlusion parallel with ala-tragus line.  No rocking of the wax dentures noted.   Patient was given a mirror for input on overall appearance and fit of partial denture try-in.  The patient is satisfied with the denture teeth shape, size, color/shade and overall appearance and fit of the the wax try-in dentures. Gingival shade L-199 LRP was picked out and verified by the patient.   PLAN: Call if any questions or concerns arise before next visit.     Next visit:  Postoperative visit and Maxillary RPD Delivery    All questions and concerns were invited and addressed.  The patient tolerated today's procedure well and departed in stable condition.   Charlaine Dalton, D.M.D.

## 2021-06-09 ENCOUNTER — Other Ambulatory Visit: Payer: Self-pay | Admitting: *Deleted

## 2021-06-09 NOTE — Patient Outreach (Signed)
Care Coordination  06/09/2021  Tanner Thomas 01-Oct-1955 [id number]   Medicaid Managed Care   Unsuccessful Outreach Note  06/09/2021 Name: Tanner Thomas MRN: [id number] DOB: 1955-08-22  Referred by: Lindell Spar, MD Reason for referral : High Risk Managed Medicaid (Unsuccessful RNCM follow up outreach, more than 3 attempts by MM team)   Third unsuccessful telephone outreach was attempted today. The patient was referred to the case management team for assistance with care management and care coordination. The patient's primary care provider has been notified of our unsuccessful attempts to make or maintain contact with the patient. The care management team is pleased to engage with this patient at any time in the future should he/she be interested in assistance from the care management team.   Follow Up Plan: We have been unable to make contact with the patient for follow up. The care management team is available to follow up with the patient after provider conversation with the patient regarding recommendation for care management engagement and subsequent re-referral to the care management team.   [name] RN, BSN [place] RN Care Coordinator

## 2021-06-10 ENCOUNTER — Encounter (HOSPITAL_COMMUNITY): Payer: Medicaid Other | Admitting: Dentistry

## 2021-06-24 ENCOUNTER — Other Ambulatory Visit: Payer: Self-pay

## 2021-06-24 ENCOUNTER — Ambulatory Visit (INDEPENDENT_AMBULATORY_CARE_PROVIDER_SITE_OTHER): Payer: Medicaid Other | Admitting: Dentistry

## 2021-06-24 ENCOUNTER — Encounter (HOSPITAL_COMMUNITY): Payer: Self-pay | Admitting: Dentistry

## 2021-06-24 VITALS — BP 107/71 | HR 61 | Temp 98.2°F

## 2021-06-24 DIAGNOSIS — K08109 Complete loss of teeth, unspecified cause, unspecified class: Secondary | ICD-10-CM

## 2021-06-24 NOTE — Patient Instructions (Signed)
[facility] Department of Dental Medicine Dr. Manjinder Breau B. Noelene Gang, D.M.D. Phone: [phone] Fax: [phone]    Congratulations, you are on the way to oral rehabilitation!  You have just received a new set of complete or partial dentures.  Dentures give you many of the benefits of a full set of teeth.  They help restore your ability to bite and chew food.  They can help you speak more clearly.  And having dentures can improve the shape of your jaw line and give you a natural smile.    Getting used to dentures can take a while.  At first, you will be aware of a new feeling in your mouth.  Biting and chewing on your food and even talking may seem a little different.  But soon they will feel like a natural part of your mouth.  These instructions will help you get adjusted to your dentures as well as care for them properly.  Please read these instructions carefully and completely as soon as you get home.  If you or your caregiver have any questions please call the [facility] at [phone].    INSTRUCTIONS FOR DENTURE USE AND CARE   How Will Your Dentures Look and Feel? Soon after you begin wearing your dentures, you may feel that your dentures are too large or even loose.  As our mouth and facial muscles become accustomed to the dentures, these feelings will go away.   You also may feel that you are salivating more than you normally do.  This feeling should go away as you get used to having the dentures in your mouth.   You may bite your cheek or your tongue; this will eventually resolve itself as you wear your dentures.  Some soreness is to be expected, but you should not hurt.  If your mouth hurts, call your dentist. A denture adhesive may occasionally be necessary to hold your dentures in place more securely.  The dentist will let you know when one is recommended for you.  SPEAKING:  Wearing dentures will change the sound of your voice  initially.  This will be noticed by you more than anyone else.   Bite and swallow before you speak, in order to place your dentures in position so that you may speak more clearly.   Practice speaking by reading aloud or counting from 1 to 100 very slowly and distinctly.   After some practice, your mouth will become accustomed to your dentures and you will speak more clearly.  EATING:  Chewing will definitely feel different after you receive your dentures.  With a little practice and patience, you should be able to eat just about any kind of food.   Begin by eating small quantities of food that are cut into small pieces.  Start with soft foods such as eggs, cooked vegetables, or puddings.   As you gain confidence, try more foods.   How Do You Care for Your Dentures?   Dentures can collect plaque and calculus much the same as natural teeth can.  If not removed on a regular basis, your dentures will not look or feel clean, and you will experience denture odor.  It is very important that you remove your dentures at bedtime and clean them thoroughly.    YOU SHOULD: Clean your dentures over a sink full of water so if dropped, they will not break.  You can also stand over a folded towel. Rinse your dentures with   cool water to remove any large food particles. Use soap and water or a denture cleanser or paste to clean the dentures.  Do not use regular toothpaste as it may abrade the denture base or teeth. Use a moistened denture brush to clean all surfaces (inside and outside). Rinse thoroughly to remove any remaining soap or denture cleanser. Use a soft bristle toothbrush to gently brush any natural teeth, gums, tongue, and palate at bedtime and before reinserting your dentures. Do not sleep with your dentures in your mouth at night.  Remove your dentures and soak them overnight in a denture cup filled with water or denture solution as recommended by your dentist.  This routine will become second nature  and will increase the life and comfort of your dentures.     When Should You Call for Help?  Watch closely for changes in your health, and be sure to contact your dentist if: Your dentures do not fit well. You have sores in your mouth. Your dentures cause pain.   FOLLOW-UP:  Your dentist will adjust your dentures from time to time to help them continue to fit well and to see you at least once a year for a check-up and examination. Please do not try to adjust your dentures yourself; you could damage them.    

## 2021-06-24 NOTE — Progress Notes (Signed)
Department of Dental Medicine   Service Date:    06/24/2021  Patient Name:   Tanner Thomas Date of Birth:    1956/02/10 Medical Record Number:  [id number]   TODAY'S VISIT: DENTURE DELIVERY   DIAGNOSIS: Partial edentulism PROCEDURES: Upper acrylic RPD delivery  Next visit:  Denture adjustment   06/24/2021   HISTORY OF PRESENT ILLNESS: Tanner Thomas presents today for maxillary acrylic removable partial denture delivery.  Medical and dental history reviewed with the patient.  No changes reported.   CHIEF COMPLAINT:   Here for a routine dental appointment; patient with no complaints.   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     cholecalciferol (VITAMIN D3) 25 MCG (1000 UNIT) tablet Take 5,000 Units by mouth daily.     lidocaine (XYLOCAINE) 2 % solution Patient: Mix 1part 2% viscous lidocaine, 1part H20. Swish & swallow 39mL of  diluted mixture, 27min before meals and at bedtime, up to QID 300 mL 2   sodium fluoride (SODIUM FLUORIDE 5000 PPM) 1.1 % GEL dental gel Place 1 pea-size drop into each tooth space of fluoride trays once a day at bedtime.  Leave trays in for 5 minutes and then remove.  Spit out excess fluoride, but DO NOT rinse with water, eat or drink for at least 30 minutes after use. 100 mL 11   Sodium Fluoride (SODIUM FLUORIDE 5000 PPM) 1.1 % PSTE Take 1 application by mouth in the morning and at bedtime. 96 g 11   No current facility-administered medications for this visit.   No Known Allergies   VITALS: BP 107/71 (BP Location: Right Arm, Patient Position: Sitting, Cuff Size: Normal)   Pulse 61   Temp 98.2 F (36.8 C) (Oral)    ASSESSMENT: Partially edentulous maxilla    PROCEDURES: Upper removable partial denture delivery.   Maxillary partial denture seated.  No rocking noted.  Applied PIP paste and seated maxillary denture.  Noted heavy spots and adjusted with acrylic bur as needed. Polished areas where adjustments were made.  Good esthetics, phonetics, overall retention and function noted. Had patient demonstrate placing and removing dentures.  Discussed home care with the patient including brushing with dish soap with denture toothbrush in the evenings and storing in a bowl of water at night.  Restrictions and limitations of dentures reviewed with the patient in verbal and written format.  Patient verbalized understanding of instructions and is satisfied with overall  appearance and fit of dentures.   Denture toothbrush and container given to patient.   PLAN:  Next visit:  Denture adjustment  Call should any questions or concerns arise before next visit.  All questions and concerns were invited and addressed.  The patient tolerated today's visit well and departed in stable condition.  Charlaine Dalton, D.M.D.

## 2021-07-04 ENCOUNTER — Telehealth (HOSPITAL_COMMUNITY): Payer: Self-pay

## 2021-07-06 ENCOUNTER — Encounter (HOSPITAL_COMMUNITY): Payer: Self-pay | Admitting: Gastroenterology

## 2021-07-14 ENCOUNTER — Ambulatory Visit (HOSPITAL_COMMUNITY)
Admission: RE | Admit: 2021-07-14 | Discharge: 2021-07-14 | Disposition: A | Discharge status: Home / Self Care | Payer: Medicaid Other | Source: Ambulatory Visit | Attending: Gastroenterology | Admitting: Gastroenterology

## 2021-07-14 ENCOUNTER — Other Ambulatory Visit: Payer: Self-pay

## 2021-07-14 ENCOUNTER — Encounter (HOSPITAL_COMMUNITY)
Admission: RE | Disposition: A | Discharge status: Home / Self Care | Payer: Self-pay | Source: Ambulatory Visit | Attending: Gastroenterology

## 2021-07-14 ENCOUNTER — Ambulatory Visit (HOSPITAL_COMMUNITY): Payer: Medicaid Other

## 2021-07-14 ENCOUNTER — Encounter (HOSPITAL_COMMUNITY): Payer: Self-pay | Admitting: Gastroenterology

## 2021-07-14 ENCOUNTER — Ambulatory Visit (HOSPITAL_BASED_OUTPATIENT_CLINIC_OR_DEPARTMENT_OTHER): Payer: Medicaid Other | Admitting: Registered Nurse

## 2021-07-14 ENCOUNTER — Ambulatory Visit (HOSPITAL_COMMUNITY): Payer: Medicaid Other | Admitting: Registered Nurse

## 2021-07-14 DIAGNOSIS — Z87891 Personal history of nicotine dependence: Secondary | ICD-10-CM | POA: Diagnosis not present

## 2021-07-14 DIAGNOSIS — E039 Hypothyroidism, unspecified: Secondary | ICD-10-CM

## 2021-07-14 DIAGNOSIS — K861 Other chronic pancreatitis: Secondary | ICD-10-CM

## 2021-07-14 DIAGNOSIS — Z4659 Encounter for fitting and adjustment of other gastrointestinal appliance and device: Secondary | ICD-10-CM

## 2021-07-14 DIAGNOSIS — K831 Obstruction of bile duct: Secondary | ICD-10-CM | POA: Diagnosis not present

## 2021-07-14 DIAGNOSIS — K838 Other specified diseases of biliary tract: Secondary | ICD-10-CM | POA: Insufficient documentation

## 2021-07-14 DIAGNOSIS — Z4689 Encounter for fitting and adjustment of other specified devices: Secondary | ICD-10-CM

## 2021-07-14 HISTORY — PX: REMOVAL OF STONES: SHX5545

## 2021-07-14 HISTORY — PX: ERCP: SHX5425

## 2021-07-14 SURGERY — ERCP, WITH INTERVENTION IF INDICATED
Anesthesia: General

## 2021-07-14 MED ORDER — ONDANSETRON HCL 4 MG/2ML IJ SOLN: 4.0000 mg | Freq: Once | INTRAMUSCULAR | Status: DC | PRN

## 2021-07-14 MED ORDER — ROCURONIUM BROMIDE 100 MG/10ML IV SOLN
INTRAVENOUS | Status: DC | PRN
  Administered 2021-07-14: 40 mg via INTRAVENOUS

## 2021-07-14 MED ORDER — LACTATED RINGERS IV SOLN
INTRAVENOUS | Status: DC
  Administered 2021-07-14: 1000 mL via INTRAVENOUS

## 2021-07-14 MED ORDER — GLUCAGON HCL RDNA (DIAGNOSTIC) 1 MG IJ SOLR
INTRAMUSCULAR | Status: AC
  Filled 2021-07-14: qty 1

## 2021-07-14 MED ORDER — INDOMETHACIN 50 MG RE SUPP
RECTAL | Status: AC
  Filled 2021-07-14: qty 1

## 2021-07-14 MED ORDER — LIDOCAINE 2% (20 MG/ML) 5 ML SYRINGE
INTRAMUSCULAR | Status: DC | PRN
  Administered 2021-07-14: 50 mg via INTRAVENOUS

## 2021-07-14 MED ORDER — PHENYLEPHRINE HCL (PRESSORS) 10 MG/ML IV SOLN
INTRAVENOUS | Status: AC
  Filled 2021-07-14: qty 1

## 2021-07-14 MED ORDER — FENTANYL CITRATE (PF) 100 MCG/2ML IJ SOLN
INTRAMUSCULAR | Status: DC | PRN
  Administered 2021-07-14: 25 ug via INTRAVENOUS
  Administered 2021-07-14: 50 ug via INTRAVENOUS

## 2021-07-14 MED ORDER — ONDANSETRON HCL 4 MG/2ML IJ SOLN
INTRAMUSCULAR | Status: DC | PRN
  Administered 2021-07-14: 4 mg via INTRAVENOUS

## 2021-07-14 MED ORDER — SODIUM CHLORIDE 0.9 % IV SOLN: INTRAVENOUS | Status: DC

## 2021-07-14 MED ORDER — CIPROFLOXACIN IN D5W 400 MG/200ML IV SOLN
INTRAVENOUS | Status: AC
  Filled 2021-07-14: qty 200

## 2021-07-14 MED ORDER — FENTANYL CITRATE (PF) 100 MCG/2ML IJ SOLN
INTRAMUSCULAR | Status: AC
  Filled 2021-07-14: qty 2

## 2021-07-14 MED ORDER — CIPROFLOXACIN IN D5W 400 MG/200ML IV SOLN
INTRAVENOUS | Status: DC | PRN
  Administered 2021-07-14: 400 mg via INTRAVENOUS

## 2021-07-14 MED ORDER — PROPOFOL 1000 MG/100ML IV EMUL
INTRAVENOUS | Status: AC
  Filled 2021-07-14: qty 100

## 2021-07-14 MED ORDER — PROPOFOL 10 MG/ML IV BOLUS
INTRAVENOUS | Status: DC | PRN
  Administered 2021-07-14: 120 mg via INTRAVENOUS

## 2021-07-14 MED ORDER — SUGAMMADEX SODIUM 200 MG/2ML IV SOLN
INTRAVENOUS | Status: DC | PRN
  Administered 2021-07-14: 100 mg via INTRAVENOUS

## 2021-07-14 MED ORDER — AMISULPRIDE (ANTIEMETIC) 5 MG/2ML IV SOLN
10.0000 mg | Freq: Once | INTRAVENOUS | Status: DC | PRN
  Filled 2021-07-14: qty 4

## 2021-07-14 MED ORDER — PHENYLEPHRINE HCL-NACL 20-0.9 MG/250ML-% IV SOLN
INTRAVENOUS | Status: DC | PRN
  Administered 2021-07-14: 35 ug/min via INTRAVENOUS

## 2021-07-14 NOTE — Anesthesia Procedure Notes (Signed)
Procedure Name: Intubation ?Date/Time: 07/14/2021 9:59 AM ?Performed by: Lissa Morales, CRNA ?Pre-anesthesia Checklist: Patient identified, Emergency Drugs available, Suction available and Patient being monitored ?Patient Re-evaluated:Patient Re-evaluated prior to induction ?Oxygen Delivery Method: Circle system utilized ?Preoxygenation: Pre-oxygenation with 100% oxygen ?Induction Type: IV induction ?Ventilation: Mask ventilation without difficulty ?Laryngoscope Size: Mac and 4 ?Tube type: Oral ?Tube size: 7.5 mm ?Number of attempts: 1 ?Airway Equipment and Method: Stylet and Oral airway ?Placement Confirmation: ETT inserted through vocal cords under direct vision, positive ETCO2 and breath sounds checked- equal and bilateral ?Secured at: 25 cm ?Tube secured with: Tape ?Dental Injury: Teeth and Oropharynx as per pre-operative assessment  ?Comments: Unable to visualize  cords ,large a ?Epiglottis,ETT passed under epiglottis Positive ETCO2 ,bilateral Breath sounds ? ? ? ? ?

## 2021-07-14 NOTE — Anesthesia Postprocedure Evaluation (Signed)
Anesthesia Post Note ? ?Patient: DOZIER BERKOVICH ? ?Procedure(s) Performed: ENDOSCOPIC RETROGRADE CHOLANGIOPANCREATOGRAPHY (ERCP) ? ?  ? ?Patient location during evaluation: PACU ?Anesthesia Type: General ?Level of consciousness: awake and alert ?Pain management: pain level controlled ?Vital Signs Assessment: post-procedure vital signs reviewed and stable ?Respiratory status: spontaneous breathing, nonlabored ventilation, respiratory function stable and patient connected to nasal cannula oxygen ?Cardiovascular status: blood pressure returned to baseline and stable ?Postop Assessment: no apparent nausea or vomiting ?Anesthetic complications: no ? ? ?No notable events documented. ? ?Last Vitals:  ?Vitals:  ? 07/14/21 1216 07/14/21 1226  ?BP: (!) 151/77 138/80  ?Pulse: (!) 44 (!) 45  ?Resp: 11 10  ?Temp:    ?SpO2: 98% 98%  ?  ?Last Pain:  ?Vitals:  ? 07/14/21 1226  ?TempSrc:   ?PainSc: 2   ? ? ?  ?  ?  ?  ?  ?  ? ?Effie Berkshire ? ? ? ? ?

## 2021-07-14 NOTE — Anesthesia Preprocedure Evaluation (Addendum)
Anesthesia Evaluation  ? ? ?Reviewed: ?Allergy & Precautions, Patient's Chart, lab work & pertinent test results ? ?Airway ?Mallampati: I ? ?TM Distance: >3 FB ?Neck ROM: Full ? ? ? Dental ? ?(+) Partial Lower, Partial Upper, Dental Advisory Given, Missing ?  ?Pulmonary ?former smoker,  ?  ?breath sounds clear to auscultation ? ? ? ? ? ? Cardiovascular ?negative cardio ROS ? ? ?Rhythm:Regular Rate:Normal ? ? ?  ?Neuro/Psych ?negative neurological ROS ? negative psych ROS  ? GI/Hepatic ?negative GI ROS, Neg liver ROS,   ?Endo/Other  ?Hypothyroidism  ? Renal/GU ?negative Renal ROS  ? ?  ?Musculoskeletal ?negative musculoskeletal ROS ?(+)  ? Abdominal ?Normal abdominal exam  (+)   ?Peds ? Hematology ?  ?Anesthesia Other Findings ? ? Reproductive/Obstetrics ? ?  ? ? ? ? ? ? ? ? ? ? ? ? ? ?  ?  ? ? ? ? ? ? ? ? ?Anesthesia Physical ?Anesthesia Plan ? ?ASA: 3 ? ?Anesthesia Plan: General  ? ?Post-op Pain Management:   ? ?Induction: Intravenous ? ?PONV Risk Score and Plan: 2 and Ondansetron, Dexamethasone and Midazolam ? ?Airway Management Planned: Oral ETT ? ?Additional Equipment: None ? ?Intra-op Plan:  ? ?Post-operative Plan: Extubation in OR ? ?Informed Consent: I have reviewed the patients History and Physical, chart, labs and discussed the procedure including the risks, benefits and alternatives for the proposed anesthesia with the patient or authorized representative who has indicated his/her understanding and acceptance.  ? ? ? ?Dental advisory given ? ?Plan Discussed with: CRNA ? ?Anesthesia Plan Comments:   ? ? ? ? ? ?Anesthesia Quick Evaluation ? ?

## 2021-07-14 NOTE — H&P (Signed)
?Referred by [facility] gastroenterology 2020: chronic calcific pancreatitis, etoh related. Recent dilated CBD, referred by Dr. Gala Romney and evauated by EUS 4/256month ago suggested distal CBD debris,sludge; interval lap chole Dr. JArnoldo Moralefor cholelithiasis, IOC not performed. Normal LFTs. ERCP 10/2019 Dr. JArdis Hughswith sphincterotomy, (at least partial) stone removal, had some concern for  distal biliary stricture vs. residual stone biliary stent placement 10Fr 5cm long.  Repeat ERCP Dr. JArdis HughsOctober 2021 previous biliary stent was removed.  I sampled the distal bile duct stricture with spy bite, ERCP brush cytology, as well as sending the previous stent.  None of these showed atypical cells.  During the procedure sludge and bile debris, small stones were swept from the duct.  A 6 cm long 10 mm diameter fully covered SEMS was placed. ? ?HPI: ?This is a man with biliary stent in place, here for removal ? ? ?ROS: complete GI ROS as described in HPI, all other review negative. ? ?Constitutional:  No unintentional weight loss ? ? ?Past Medical History:  ?Diagnosis Date  ? Acute upper GI bleed 02/05/2020  ? Calculus of gallbladder without cholecystitis without obstruction   ? E coli Pyelonephritis 01/16/2019  ? Head and neck lymphadenopathy 05/26/2020  ? Tobacco use disorder 07/05/2019  ? ? ?Past Surgical History:  ?Procedure Laterality Date  ? BILIARY DILATION  10/30/2019  ? Procedure: BILIARY DILATION;  Surgeon: JMilus Banister MD;  [facility];  Service: Endoscopy;;  ? BILIARY DILATION  02/05/2020  ? Procedure: BILIARY DILATION;  Surgeon: JMilus Banister MD;  [facility];  Service: Endoscopy;;  ? BILIARY STENT PLACEMENT N/A 10/30/2019  ? Procedure: BILIARY STENT PLACEMENT;  Surgeon: JMilus Banister MD;  [facility];  Service: Endoscopy;  Laterality: N/A;  ? BILIARY STENT PLACEMENT N/A 02/05/2020  ? Procedure: BILIARY STENT PLACEMENT;  Surgeon: JMilus Banister MD;  [facility];  Service: Endoscopy;  Laterality: N/A;  ? BIOPSY  03/06/2019  ? Procedure: BIOPSY;  Surgeon: RDaneil Dolin MD;  [facility];  Service: Endoscopy;;  gastric  ? BIOPSY  02/05/2020  ? Procedure: BIOPSY;  Surgeon: JMilus Banister MD;  [facility];  Service: Endoscopy;;  bile buct brushing  ? CHOLECYSTECTOMY N/A 09/05/2019  ? Procedure: LAPAROSCOPIC CHOLECYSTECTOMY;  Surgeon: JAviva Signs MD;  Location: [facility];  Service: General;  Laterality: N/A;  ? COLONOSCOPY WITH PROPOFOL N/A 03/06/2019  ? Procedure: COLONOSCOPY WITH PROPOFOL;  Surgeon: RDaneil Dolin MD;  [facility];  Service: Endoscopy;  Laterality: N/A;  9:45am [facility] ? ENDOSCOPIC RETROGRADE CHOLANGIOPANCREATOGRAPHY (ERCP) WITH PROPOFOL N/A 10/30/2019  ? Procedure: ENDOSCOPIC RETROGRADE CHOLANGIOPANCREATOGRAPHY (ERCP) WITH PROPOFOL;  Surgeon: JMilus Banister MD;  [facility];  Service: Endoscopy;  Laterality: N/A;  ? ENDOSCOPIC RETROGRADE CHOLANGIOPANCREATOGRAPHY (ERCP) WITH PROPOFOL N/A 02/05/2020  ? Procedure: ENDOSCOPIC RETROGRADE CHOLANGIOPANCREATOGRAPHY (ERCP) WITH PROPOFOL;  Surgeon: JMilus Banister MD;  [facility];  Service: Endoscopy;  Laterality: N/A;  ? ESOPHAGOGASTRODUODENOSCOPY (EGD) WITH PROPOFOL N/A 03/06/2019  ? Procedure: ESOPHAGOGASTRODUODENOSCOPY (EGD) WITH PROPOFOL;  Surgeon: RDaneil Dolin MD;  [facility];  Service: Endoscopy;  Laterality: N/A;  ? ESOPHAGOGASTRODUODENOSCOPY (EGD) WITH PROPOFOL N/A 08/21/2019  ? Procedure: ESOPHAGOGASTRODUODENOSCOPY (EGD) WITH PROPOFOL;  Surgeon: JMilus Banister MD;  [facility];  Service: Endoscopy;  Laterality: N/A;  ? EUS N/A 08/21/2019  ? Procedure: UPPER ENDOSCOPIC ULTRASOUND (EUS) RADIAL;  Surgeon: JMilus Banister MD;  [facility];  Service: Endoscopy;  Laterality: N/A;  ? PANCREAS SURGERY    ? REMOVAL OF STONES  10/30/2019  ? Procedure: REMOVAL OF STONES;  Surgeon:  Milus Banister, MD;  [facility];  Service: Endoscopy;;  ? REMOVAL OF STONES  02/05/2020  ? Procedure: REMOVAL OF STONES;  Surgeon: Milus Banister, MD;  [facility];  Service: Endoscopy;;  ? SPHINCTEROTOMY  10/30/2019  ? Procedure: SPHINCTEROTOMY;  Surgeon: Milus Banister, MD;  [facility];  Service: Endoscopy;;  ? SPHINCTEROTOMY  02/05/2020  ? Procedure: SPHINCTEROTOMY;  Surgeon: Milus Banister, MD;  [facility];  Service: Endoscopy;;  ? SPYGLASS CHOLANGIOSCOPY N/A 02/05/2020  ? Procedure: SPYGLASS CHOLANGIOSCOPY;  Surgeon: Milus Banister, MD;  [facility];  Service: Endoscopy;  Laterality: N/A;  ? STENT REMOVAL  02/05/2020  ? Procedure: STENT REMOVAL;  Surgeon: Milus Banister, MD;  [facility];  Service: Endoscopy;;  ? ? ?Current Outpatient Medications  ?Medication Instructions  ? acetaminophen (TYLENOL) 500 mg, Oral, Every 6 hours PRN  ? cholecalciferol (VITAMIN D3) 1,000 Units, Oral, Daily  ? lidocaine (XYLOCAINE) 2 % solution Patient: Mix 1part 2% viscous lidocaine, 1part H20. Swish & swallow 74m of diluted mixture, 379m before meals and at bedtime, up to QID  ? sodium fluoride (SODIUM FLUORIDE 5000 PPM) 1.1 % GEL dental gel Place 1 pea-size drop into each tooth space of fluoride trays once a day at bedtime.  Leave trays in for 5 minutes and then remove.  Spit out excess fluoride, but DO NOT rinse with water, eat or drink for at least 30 minutes after use.  ? Sodium Fluoride (SODIUM FLUORIDE 5000 PPM) 1.1 % PSTE 1 application., Oral, 2 times daily  ? ? ?Allergies as of 05/31/2021  ? (No Known Allergies)  ? ? ?Family History  ?Problem Relation Age of Onset  ? Heart disease Father   ? Colon cancer Neg Hx   ? Gastric cancer Neg Hx   ? Esophageal cancer Neg Hx   ? ? ?Social History  ? ?Socioeconomic History  ? Marital status: Single  ?  Spouse name: Not on file  ? Number of children: Not on file  ? Years of education: Not on file  ?  Highest education level: Not on file  ?Occupational History  ? Not on file  ?Tobacco Use  ? Smoking status: Former  ?  Packs/day: 0.00  ?  Years: 50.00  ?  Pack years: 0.00  ?  Types: Cigarettes  ?  Start date: 05/24/1967  ?  Quit date: 06/2020  ?  Years since quitting: 1.0  ? Smokeless tobacco: Never  ?Vaping Use  ? Vaping Use: Never used  ?Substance and Sexual Activity  ? Alcohol use: Not Currently  ? Drug use: Not Currently  ?  Types: Marijuana  ? Sexual activity: Yes  ?Other Topics Concern  ? Not on file  ?Social History Narrative  ? Not on file  ? ?Social Determinants of Health  ? ?Financial Resource Strain: High Risk  ? Difficulty of Paying Living Expenses: Very hard  ?Food Insecurity: No Food Insecurity  ? Worried About RuCharity fundraisern the Last Year: Never true  ? Ran Out of Food in the Last Year: Never true  ?Transportation Needs: No Transportation Needs  ? Lack of Transportation (Medical): No  ? Lack of Transportation (Non-Medical): No  ?Physical Activity: Not on file  ?Stress: Not on file  ?Social Connections: Moderately Integrated  ?  Frequency of Communication with Friends and Family: More than three times a week  ? Frequency of Social Gatherings with Friends and Family: More than three times a week  ? Attends Religious Services: More than 4 times per year  ? Active Member of Clubs or Organizations: Yes  ? Attends Archivist Meetings: More than 4 times per year  ? Marital Status: Never married  ?Intimate Partner Violence: Not on file  ? ? ? ?Physical Exam: ?BP 127/83   Pulse (!) 53   Temp 97.8 ?F (36.6 ?C) (Tympanic)   Resp 12   Ht '5\' 10"'$  (1.778 m)   Wt 55.3 kg   SpO2 99%   BMI 17.51 kg/m?  ?Constitutional: generally well-appearing ?Psychiatric: alert and oriented x3 ?Abdomen: soft, nontender, nondistended, no obvious ascites, no peritoneal signs, normal bowel sounds ?No peripheral edema noted in lower extremities ? ?Assessment and plan: ?66 y.o. male with biliary stent ? ?ERCP,  removal of stent, ?replacement stent ? ?Please see the "Patient Instructions" section for addition details about the plan. ? ?Owens Loffler, MD ?[facility] ?07/14/2021, 9:26 AM ? ? ? ?

## 2021-07-14 NOTE — Discharge Instructions (Signed)
YOU HAD AN ENDOSCOPIC PROCEDURE TODAY: Refer to the procedure report and other information in the discharge instructions given to you for any specific questions about what was found during the examination. If this information does not answer your questions, please call [place] office at [phone] to clarify.  ° °YOU SHOULD EXPECT: Some feelings of bloating in the abdomen. Passage of more gas than usual. Walking can help get rid of the air that was put into your GI tract during the procedure and reduce the bloating. If you had a lower endoscopy (such as a colonoscopy or flexible sigmoidoscopy) you may notice spotting of blood in your stool or on the toilet paper. Some abdominal soreness may be present for a day or two, also. ° °DIET: Your first meal following the procedure should be a light meal and then it is ok to progress to your normal diet. A half-sandwich or bowl of soup is an example of a good first meal. Heavy or fried foods are harder to digest and may make you feel nauseous or bloated. Drink plenty of fluids but you should avoid alcoholic beverages for 24 hours. If you had a esophageal dilation, please see attached instructions for diet.   ° °ACTIVITY: Your care partner should take you home directly after the procedure. You should plan to take it easy, moving slowly for the rest of the day. You can resume normal activity the day after the procedure however YOU SHOULD NOT DRIVE, use power tools, machinery or perform tasks that involve climbing or major physical exertion for 24 hours (because of the sedation medicines used during the test).  ° °SYMPTOMS TO REPORT IMMEDIATELY: °A gastroenterologist can be reached at any hour. Please call [phone]  for any of the following symptoms:  °Following lower endoscopy (colonoscopy, flexible sigmoidoscopy) °Excessive amounts of blood in the stool  °Significant tenderness, worsening of abdominal pains  °Swelling of the abdomen that is new, acute  °Fever of 100° or  higher  °Following upper endoscopy (EGD, EUS, ERCP, esophageal dilation) °Vomiting of blood or coffee ground material  °New, significant abdominal pain  °New, significant chest pain or pain under the shoulder blades  °Painful or persistently difficult swallowing  °New shortness of breath  °Black, tarry-looking or red, bloody stools ° °FOLLOW UP:  °If any biopsies were taken you will be contacted by phone or by letter within the next 1-3 weeks. Call [phone]  if you have not heard about the biopsies in 3 weeks.  °Please also call with any specific questions about appointments or follow up tests. ° °

## 2021-07-14 NOTE — Progress Notes (Signed)
Talked with dr. Ardis Hughs and made him aware of xray results showing no free air. Patient reports his pain is now at a 2. Dr. Ardis Hughs said that he can be discharged.  ?

## 2021-07-14 NOTE — Transfer of Care (Signed)
Immediate Anesthesia Transfer of Care Note ? ?Patient: Tanner Thomas ? ?Procedure(s) Performed: ENDOSCOPIC RETROGRADE CHOLANGIOPANCREATOGRAPHY (ERCP) ? ?Patient Location: PACU ? ?Anesthesia Type:General ? ?Level of Consciousness: awake, alert , oriented and patient cooperative ? ?Airway & Oxygen Therapy: Patient Spontanous Breathing and Patient connected to face mask oxygen ? ?Post-op Assessment: Report given to RN, Post -op Vital signs reviewed and stable and Patient moving all extremities X 4 ? ?Post vital signs: stable ? ?Last Vitals:  ?Vitals Value Taken Time  ?BP 143/81 07/14/21 1120  ?Temp 36.5 ?C 07/14/21 1116  ?Pulse 53 07/14/21 1121  ?Resp 12 07/14/21 1121  ?SpO2 100 % 07/14/21 1121  ?Vitals shown include unvalidated device data. ? ?Last Pain:  ?Vitals:  ? 07/14/21 1116  ?TempSrc: Tympanic  ?PainSc:   ?   ? ?  ? ?Complications: No notable events documented. ?

## 2021-07-14 NOTE — Op Note (Addendum)
[facility] ?Patient Name: Tanner Thomas ?Procedure Date: 07/14/2021 ?MRN: [id number] ?Attending MD: Milus Banister , MD ?Date of Birth: [birth year] ?CSN: [id number] ?Age: 66 ?Admit Type: Inpatient ?Procedure:                ERCP ?Indications:              Stent removal; Referred by [facility]  ?                          gastroenterology 2020:?chronic calcific  ?                          pancreatitis, etoh related. Recent dilated CBD,  ?                          referred by Dr. Gala Romney and evauated by EUS  ?                          4/267month ago suggested distal CBD  ?                          debris,sludge; interval lap chole Dr. JArnoldo Moralefor  ?                          cholelithiasis, IOC not performed. Normal LFTs.  ?                          ERCP 10/2019 Dr. JArdis Hughswith sphincterotomy, (at  ?                          least partial) stone removal, had some concern for  ?                          ?distal biliary stricture vs. residual  ?                          stone?biliary stent placement 10Fr 5cm  ?                          long.??Repeat ERCP Dr. JArdis HughsOctober 2021 previous  ?                          biliary stent was removed. ?I sampled the distal  ?                          bile duct stricture with spy bite, ERCP brush  ?                          cytology, as well as sending the previous stent.  ?                          ?None of these showed atypical cells. ?During the  ?                          procedure sludge  and bile debris, small stones were  ?                          swept from the duct. ?A 6 cm long 10 mm diameter  ?                          fully covered SEMS?was placed. ?Providers:                Milus Banister, MD, Carmie End, RN,  ?                          William Dalton, Technician ?Referring MD:              ?Medicines:                General Anesthesia, Indomethacin 100 mg PR, Cipro  ?                          400 mg IV ?Complications:            No immediate  complications. Estimated blood loss:  ?                          None ?Estimated Blood Loss:     Estimated blood loss: none. ?Procedure:                Pre-Anesthesia Assessment: ?                          - Prior to the procedure, a History and Physical  ?                          was performed, and patient medications and  ?                          allergies were reviewed. The patient's tolerance of  ?                          previous anesthesia was also reviewed. The risks  ?                          and benefits of the procedure and the sedation  ?                          options and risks were discussed with the patient.  ?                          All questions were answered, and informed consent  ?                          was obtained. Prior Anticoagulants: The patient has  ?                          taken no previous anticoagulant or antiplatelet  ?  agents. ASA Grade Assessment: II - A patient with  ?                          mild systemic disease. After reviewing the risks  ?                          and benefits, the patient was deemed in  ?                          satisfactory condition to undergo the procedure. ?                          After obtaining informed consent, the scope was  ?                          passed under direct vision. Throughout the  ?                          procedure, the patient's blood pressure, pulse, and  ?                          oxygen saturations were monitored continuously. The  ?                          TJF-[id number] ([id number]) Olympus duodenoscope was  ?                          introduced through the mouth, and used to inject  ?                          contrast into and used to inject contrast into the  ?                          bile duct. The ERCP was accomplished without  ?                          difficulty. The patient tolerated the procedure  ?                          well. ?Scope In: ?Scope Out: ?Findings: ?     Scout film showed  previous metal biliary stent as well as surgical clips  ?     in the right upper quadrant. I advanced to the duodenoscope to the  ?     region of the major papilla. The previously placed metal stent had  ?     clearly migrated proximally and there was only a small amount of metal  ?     stent wire visible but otherwise the distal end of the stent was momstly  ?     buried in tissue. I grasped what metal wire was visible with a forceps  ?     and then a Raptor and the stent would not dislodge and instead the stent  ?     wire tore. I then switched to a standard 44 autotome over a .035 Hydra  ?  wire and cannulated the bile duct. Contrast injections showed that the  ?     stent was partially filled with biliary debris, stone debris which I  ?     swept clear using a 12 to 15 mm biliary retrieval balloon. I was able to  ?     clear out the stent quite well and then I used the inflated balloon  ?     lodged at the proximal end of the stent to try to pull the stent out.  ?     This was not successful. I tried again with forceps and Raptor claw to  ?     pull the stent by grasping visible distal end of the stent and I was  ?     successful at pulling the stent about 5 or 6 mm into the duodenum but I  ?     could not pull it out any further despite multiple attempts. Now that I  ?     had freed up the distal end of the stent I tried again to pull the stent  ?     through using the inflated 12 to 15 mm retrieval balloon without  ?     success. I decided that any more attempts, at least in my hands, were  ?     futile. The main pancreatic duct was never cannulated with a wire or  ?     injected with dye ?Impression:               Buried distal end of previously placed 6cm long  ?                          fully covered biliary SEMS. ?                          The stent was partially filled with biliary debris,  ?                          stone particles and sludge which I completely  ?                          cleared  using a retrieval balloon. ?                          My intention was to remove the stent completely  ?                          however I was not able to but I did succeed in  ?                          freeing up the distal end of the stent from tissue  ?                          overgrowth. ?Moderate Sedation: ?     Not Applicable - Patient had care per Anesthesia. ?Recommendation:           - Discharge patient to home. ?                          -  I will discuss his case with biliary colleagues  ?                          to consider another attempt to remove the  ?                          previously placed stent. ?Procedure Code(s):        --- Professional --- ?                          [phone], Endoscopic retrograde  ?                          cholangiopancreatography (ERCP); diagnostic,  ?                          including collection of specimen(s) by brushing or  ?                          washing, when performed (separate procedure) ?Diagnosis Code(s):        --- Professional --- ?                          M09.47, Encounter for fitting and adjustment of  ?                          other gastrointestinal appliance and device ?CPT copyright 2019 [employer]. All rights reserved. ?The codes documented in this report are preliminary and upon coder review may  ?be revised to meet current compliance requirements. ?Milus Banister, MD ?07/14/2021 11:20:00 AM ?This report has been signed electronically. ?Number of Addenda: 0 ?

## 2021-07-15 ENCOUNTER — Encounter (HOSPITAL_COMMUNITY): Payer: Self-pay | Admitting: Gastroenterology

## 2021-08-03 ENCOUNTER — Encounter (HOSPITAL_COMMUNITY): Payer: Medicaid Other | Admitting: Dentistry

## 2021-08-04 ENCOUNTER — Encounter: Payer: Self-pay | Admitting: Internal Medicine

## 2021-08-04 ENCOUNTER — Ambulatory Visit (INDEPENDENT_AMBULATORY_CARE_PROVIDER_SITE_OTHER): Payer: Medicaid Other | Admitting: Internal Medicine

## 2021-08-04 VITALS — BP 110/79 | HR 86 | Ht 70.0 in | Wt 117.8 lb

## 2021-08-04 DIAGNOSIS — E43 Unspecified severe protein-calorie malnutrition: Secondary | ICD-10-CM | POA: Insufficient documentation

## 2021-08-04 DIAGNOSIS — C048 Malignant neoplasm of overlapping sites of floor of mouth: Secondary | ICD-10-CM | POA: Diagnosis not present

## 2021-08-04 DIAGNOSIS — E559 Vitamin D deficiency, unspecified: Secondary | ICD-10-CM | POA: Diagnosis not present

## 2021-08-04 DIAGNOSIS — E038 Other specified hypothyroidism: Secondary | ICD-10-CM

## 2021-08-04 NOTE — Patient Instructions (Signed)
Please continue to take Vitamin D as prescribed. ?

## 2021-08-04 NOTE — Assessment & Plan Note (Signed)
Continue to take vitamin D supplements 

## 2021-08-04 NOTE — Assessment & Plan Note (Signed)
Severe weight loss, likely due to poor dentition ?Now has dentures placed, advised to eat as tolerated ?Advised to take protein supplements -samples provided today ?

## 2021-08-04 NOTE — Progress Notes (Addendum)
 Established Patient Office Visit  Subjective:  Patient ID: Tanner Thomas, male    DOB: 12/27/1955  Age: 65 y.o. MRN: [id number]  CC:  Chief Complaint  Patient presents with   Follow-up    3 month follow up patient states he recently got partials.    HPI Tanner Thomas is a 65 y.o. 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  He follows up with ENT specialist and radiation oncologist.  He has been doing well overall.  He has been able to tolerate p.o. now, including solid food.  He follows up with Dentist as well.  He has upper dentures done now.  His TSH was borderline elevated with WNL free T4.  He currently denies any severe fatigue, weight gain or constipation.   Past Medical History:  Diagnosis Date   Acute upper GI bleed 02/05/2020   Calculus of gallbladder without cholecystitis without obstruction    E coli Pyelonephritis 01/16/2019   Head and neck lymphadenopathy 05/26/2020   Tobacco use disorder 07/05/2019    Past Surgical History:  Procedure Laterality Date   BILIARY DILATION  10/30/2019   Procedure: BILIARY DILATION;  Surgeon: Jacobs, Daniel P, MD;  Location: [facility];  Service: Endoscopy;;   BILIARY DILATION  02/05/2020   Procedure: BILIARY DILATION;  Surgeon: Jacobs, Daniel P, MD;  Location: [facility];  Service: Endoscopy;;   BILIARY STENT PLACEMENT N/A 10/30/2019   Procedure: BILIARY STENT PLACEMENT;  Surgeon: Jacobs, Daniel P, MD;  Location: [facility];  Service: Endoscopy;  Laterality: N/A;   BILIARY STENT PLACEMENT N/A 02/05/2020   Procedure: BILIARY STENT PLACEMENT;  Surgeon: Jacobs, Daniel P, MD;  Location: [facility];  Service:  Endoscopy;  Laterality: N/A;   BIOPSY  03/06/2019   Procedure: BIOPSY;  Surgeon: Rourk, Robert M, MD;  Location: [facility];  Service: Endoscopy;;  gastric   BIOPSY  02/05/2020   Procedure: BIOPSY;  Surgeon: Jacobs, Daniel P, MD;  Location: [facility];  Service: Endoscopy;;  bile buct brushing   CHOLECYSTECTOMY N/A 09/05/2019   Procedure: LAPAROSCOPIC CHOLECYSTECTOMY;  Surgeon: Jenkins, Mark, MD;  Location: [facility];  Service: General;  Laterality: N/A;   COLONOSCOPY WITH PROPOFOL N/A 03/06/2019   Procedure: COLONOSCOPY WITH PROPOFOL;  Surgeon: Rourk, Robert M, MD;  Location: [facility];  Service: Endoscopy;  Laterality: N/A;  9:45am - ok at 10:00 per Melanie   ENDOSCOPIC RETROGRADE CHOLANGIOPANCREATOGRAPHY (ERCP) WITH PROPOFOL N/A 10/30/2019   Procedure: ENDOSCOPIC RETROGRADE CHOLANGIOPANCREATOGRAPHY (ERCP) WITH PROPOFOL;  Surgeon: Jacobs, Daniel P, MD;  Location: [facility];  Service: Endoscopy;  Laterality: N/A;   ENDOSCOPIC RETROGRADE CHOLANGIOPANCREATOGRAPHY (ERCP) WITH PROPOFOL N/A 02/05/2020   Procedure: ENDOSCOPIC RETROGRADE CHOLANGIOPANCREATOGRAPHY (ERCP) WITH PROPOFOL;  Surgeon: Jacobs, Daniel P, MD;  Location: [facility];  Service: Endoscopy;  Laterality: N/A;   ERCP N/A 07/14/2021   Procedure: ENDOSCOPIC RETROGRADE CHOLANGIOPANCREATOGRAPHY (ERCP);  Surgeon: Jacobs, Daniel P, MD;  Location: [facility];  Service: Endoscopy;  Laterality: N/A;   ESOPHAGOGASTRODUODENOSCOPY (EGD) WITH PROPOFOL N/A 03/06/2019   Procedure: ESOPHAGOGASTRODUODENOSCOPY (EGD) WITH PROPOFOL;  Surgeon: Rourk, Robert M, MD;  Location: [facility];  Service: Endoscopy;  Laterality: N/A;   ESOPHAGOGASTRODUODENOSCOPY (EGD) WITH PROPOFOL N/A 08/21/2019   Procedure:   ESOPHAGOGASTRODUODENOSCOPY (EGD) WITH PROPOFOL;  Surgeon: Jacobs, Daniel P, MD;  Location: [facility];  Service: Endoscopy;  Laterality: N/A;   EUS N/A 08/21/2019   Procedure: UPPER ENDOSCOPIC ULTRASOUND (EUS) RADIAL;  Surgeon: Jacobs, Daniel P, MD;   Location: [facility];  Service: Endoscopy;  Laterality: N/A;   PANCREAS SURGERY     REMOVAL OF STONES  10/30/2019   Procedure: REMOVAL OF STONES;  Surgeon: Jacobs, Daniel P, MD;  Location: [facility];  Service: Endoscopy;;   REMOVAL OF STONES  02/05/2020   Procedure: REMOVAL OF STONES;  Surgeon: Jacobs, Daniel P, MD;  Location: [facility];  Service: Endoscopy;;   REMOVAL OF STONES  07/14/2021   Procedure: REMOVAL OF SLUDGE;  Surgeon: Jacobs, Daniel P, MD;  Location: [facility];  Service: Endoscopy;;   SPHINCTEROTOMY  10/30/2019   Procedure: SPHINCTEROTOMY;  Surgeon: Jacobs, Daniel P, MD;  Location: [facility];  Service: Endoscopy;;   SPHINCTEROTOMY  02/05/2020   Procedure: SPHINCTEROTOMY;  Surgeon: Jacobs, Daniel P, MD;  Location: [facility];  Service: Endoscopy;;   SPYGLASS CHOLANGIOSCOPY N/A 02/05/2020   Procedure: SPYGLASS CHOLANGIOSCOPY;  Surgeon: Jacobs, Daniel P, MD;  Location: [facility];  Service: Endoscopy;  Laterality: N/A;   STENT REMOVAL  02/05/2020   Procedure: STENT REMOVAL;  Surgeon: Jacobs, Daniel P, MD;  Location: [facility];  Service: Endoscopy;;    Family History  Problem Relation Age of Onset   Heart disease Father    Colon cancer Neg Hx    Gastric cancer Neg Hx    Esophageal cancer Neg Hx     Social History   Socioeconomic History   Marital status: Single    Spouse name: Not on file   Number of children: Not on file   Years of education: Not on file   Highest education level: Not on file  Occupational History   Not on file  Tobacco Use   Smoking status: Former    Packs/day: 0.00    Years: 50.00    Total pack years: 0.00    Types: Cigarettes    Start date: 05/24/1967    Quit date: 06/2020    Years since quitting: 1.3   Smokeless tobacco: Never  Vaping Use   Vaping Use: Never used  Substance and Sexual Activity   Alcohol use: Not Currently   Drug use: Not Currently    Types: Marijuana   Sexual activity: Yes  Other Topics Concern    Not on file  Social History Narrative   Not on file   Social Determinants of Health   Financial Resource Strain: High Risk (07/29/2020)   Overall Financial Resource Strain (CARDIA)    Difficulty of Paying Living Expenses: Very hard  Food Insecurity: No Food Insecurity (04/05/2021)   Hunger Vital Sign    Worried About Running Out of Food in the Last Year: Never true    Ran Out of Food in the Last Year: Never true  Transportation Needs: No Transportation Needs (05/05/2021)   PRAPARE - Transportation    Lack of Transportation (Medical): No    Lack of Transportation (Non-Medical): No  Physical Activity: Not on file  Stress: Not on file  Social Connections: Moderately Integrated (05/05/2021)   Social Connection and Isolation Panel [NHANES]    Frequency of Communication with Friends and Family: More than three times a week    Frequency of Social Gatherings with Friends and Family: More than three times a week    Attends Religious Services: More than 4 times per year      Active Member of Clubs or Organizations: Yes    Attends Archivist Meetings: More than 4 times per year    Marital Status: Never married  Intimate Partner Violence: Not on file    Outpatient Medications Prior to Visit  Medication Sig Dispense Refill   acetaminophen (TYLENOL) 500 MG tablet Take 500 mg by mouth every 6 (six) hours as needed for mild pain.     cholecalciferol (VITAMIN D3) 25 MCG (1000 UNIT) tablet Take 1,000 Units by mouth daily.     lidocaine (XYLOCAINE) 2 % solution Patient: Mix 1part 2% viscous lidocaine, 1part H20. Swish & swallow 84m of diluted mixture, 352m before meals and at bedtime, up to QID 300 mL 2   sodium fluoride (SODIUM FLUORIDE 5000 PPM) 1.1 % GEL dental gel Place 1 pea-size drop into each tooth space of fluoride trays once a day at bedtime.  Leave trays in for 5 minutes and then remove.  Spit out excess fluoride, but DO NOT rinse with water, eat or drink for at least 30 minutes after  use. 100 mL 11   Sodium Fluoride (SODIUM FLUORIDE 5000 PPM) 1.1 % PSTE Take 1 application by mouth in the morning and at bedtime. 96 g 11   No facility-administered medications prior to visit.    No Known Allergies  ROS Review of Systems  Constitutional:  Positive for unexpected weight change. Negative for chills and fever.  HENT:  Negative for congestion and sore throat.   Eyes:  Negative for pain and discharge.  Respiratory:  Negative for cough and shortness of breath.   Cardiovascular:  Negative for chest pain and palpitations.  Gastrointestinal:  Negative for constipation, diarrhea, nausea and vomiting.  Endocrine: Negative for polydipsia and polyuria.  Genitourinary:  Negative for dysuria and hematuria.  Musculoskeletal:  Positive for arthralgias. Negative for neck pain and neck stiffness.  Skin:  Negative for rash.  Neurological:  Negative for dizziness, weakness, numbness and headaches.  Psychiatric/Behavioral:  Negative for agitation and behavioral problems.       Objective:    Physical Exam Vitals reviewed.  Constitutional:      General: He is not in acute distress.    Appearance: He is not diaphoretic.  HENT:     Head: Normocephalic and atraumatic.     Nose: Nose normal.     Mouth/Throat:     Mouth: Mucous membranes are moist.     Comments: S/p removal of lower teeth Eyes:     General: No scleral icterus.    Extraocular Movements: Extraocular movements intact.  Neck:     Comments: Neck incision site - C/D/I Cardiovascular:     Rate and Rhythm: Normal rate and regular rhythm.     Pulses: Normal pulses.     Heart sounds: Normal heart sounds. No murmur heard. Pulmonary:     Breath sounds: Normal breath sounds. No wheezing or rales.  Musculoskeletal:     Cervical back: Neck supple. No tenderness.     Right lower leg: No edema.     Left lower leg: No edema.  Skin:    General: Skin is warm.     Findings: No rash.  Neurological:     General: No focal  deficit present.     Mental Status: He is alert and oriented to person, place, and time.     Sensory: No sensory deficit.     Motor: No weakness.  Psychiatric:        Mood and Affect: Mood normal.  Behavior: Behavior normal.     BP 110/79   Pulse 86   Ht 5' 10" (1.778 m)   Wt 117 lb 12.8 oz (53.4 kg)   SpO2 96%   BMI 16.90 kg/m  Wt Readings from Last 3 Encounters:  11/04/21 119 lb 3.2 oz (54.1 kg)  08/04/21 117 lb 12.8 oz (53.4 kg)  07/14/21 122 lb (55.3 kg)    Lab Results  Component Value Date   TSH 8.150 (H) 08/04/2021   Lab Results  Component Value Date   WBC 4.9 05/03/2021   HGB 13.0 05/03/2021   HCT 38.6 05/03/2021   MCV 95 05/03/2021   PLT 144 (L) 05/03/2021   Lab Results  Component Value Date   NA 140 05/03/2021   K 4.9 05/03/2021   CO2 19 (L) 05/03/2021   GLUCOSE 88 05/03/2021   BUN 6 (L) 05/03/2021   CREATININE 0.75 (L) 05/03/2021   BILITOT 0.4 05/03/2021   ALKPHOS 83 05/03/2021   AST 65 (H) 05/03/2021   ALT 23 05/03/2021   PROT 7.6 05/03/2021   ALBUMIN 4.4 05/03/2021   CALCIUM 9.0 05/03/2021   ANIONGAP 12 01/17/2021   EGFR 101 05/03/2021   Lab Results  Component Value Date   CHOL 138 03/11/2019   Lab Results  Component Value Date   HDL 44 03/11/2019   Lab Results  Component Value Date   LDLCALC 76 03/11/2019   Lab Results  Component Value Date   TRIG 88 03/11/2019   Lab Results  Component Value Date   CHOLHDL 3.1 03/11/2019   Lab Results  Component Value Date   HGBA1C 5.8 (H) 03/11/2019      Assessment & Plan:   Problem List Items Addressed This Visit       Digestive   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    S/p surgery and radiotherapy Followed by ENT and radiation oncology currently Surgical site well-healed Follows up with dentist for poor dentition as well        Endocrine   Subclinical hypothyroidism    Lab Results  Component Value Date   TSH 4.310 (H) 05/18/2021  Free T4  WNL Currently patient is asymptomatic Check TSH and free T4      Relevant Orders   TSH + free T4 (Completed)     Other   Vitamin D deficiency    Continue to take vitamin D supplements      Severe protein-calorie malnutrition (HCC)    Severe weight loss, likely due to poor dentition Now has dentures placed, advised to eat as tolerated Advised to take protein supplements -samples provided today       No orders of the defined types were placed in this encounter.   Follow-up: Return in about 6 months (around 02/03/2022) for Annual physical.    Rutwik K Patel, MD 

## 2021-08-04 NOTE — Assessment & Plan Note (Addendum)
Lab Results  Component Value Date   TSH 4.310 (H) 05/18/2021   Free T4 WNL Currently patient is asymptomatic Check TSH and free T4

## 2021-08-04 NOTE — Assessment & Plan Note (Addendum)
S/p surgery and radiotherapy Followed by ENT and radiation oncology currently Surgical site well-healed Follows up with dentist for poor dentition as well 

## 2021-08-05 ENCOUNTER — Other Ambulatory Visit: Payer: Self-pay | Admitting: Internal Medicine

## 2021-08-05 DIAGNOSIS — E038 Other specified hypothyroidism: Secondary | ICD-10-CM

## 2021-08-05 LAB — TSH+FREE T4
Free T4: 0.91 ng/dL (ref 0.82–1.77)
TSH: 8.15 u[IU]/mL — ABNORMAL HIGH (ref 0.450–4.500)

## 2021-08-19 DIAGNOSIS — R29898 Other symptoms and signs involving the musculoskeletal system: Secondary | ICD-10-CM | POA: Diagnosis not present

## 2021-08-19 DIAGNOSIS — C049 Malignant neoplasm of floor of mouth, unspecified: Secondary | ICD-10-CM | POA: Diagnosis not present

## 2021-08-30 ENCOUNTER — Telehealth: Payer: Self-pay

## 2021-08-30 DIAGNOSIS — Z4689 Encounter for fitting and adjustment of other specified devices: Secondary | ICD-10-CM

## 2021-08-30 DIAGNOSIS — K838 Other specified diseases of biliary tract: Secondary | ICD-10-CM

## 2021-08-30 DIAGNOSIS — K862 Cyst of pancreas: Secondary | ICD-10-CM

## 2021-08-30 DIAGNOSIS — K861 Other chronic pancreatitis: Secondary | ICD-10-CM

## 2021-08-30 NOTE — Telephone Encounter (Signed)
-----   Message from Milus Banister, MD sent at 08/30/2021  6:08 AM EDT ----- ?Bayle Calvo, ?Can you contact him.  Let him know that Dr. Rush Landmark is willing to try to remove the biliary stent and offer him next available ercp with Dr. Jerilynn Mages.  Thanks ? ?Lets also get a set of LFTs in the next week or so. ? ?----- Message ----- ?From: Mansouraty, Telford Nab., MD ?Sent: 07/14/2021   5:28 PM EDT ?To: Milus Banister, MD ? ?DJ, ?Happy to give an opportunity at trying to get that stent removed.  I agree the initial pictures were concerning that this was uncovered but your final pictures do look like the sludge we would see in patients who have a covered stent.  Sometimes we can grasp the stent proximally with a spy bite biopsy and see if we can pull it and involuted downwards.  The other thing would be to try and put a snare around the distal aspect of the stent now that you have pulled it out and used a snare as a means of trying to pull it rather than just a grasper which may cause it to unravel. ?If you want me to give a try it will be a couple months but I think you have cleared out the bile duct well enough that that is probably okay. ?Let me know what you decide. ?GM ?----- Message ----- ?From: Milus Banister, MD ?Sent: 07/14/2021  11:23 AM EDT ?To: Irving Copas., MD ? ?Hey, ?Check out my ERCP from today.  Never had a problem like this before. Started to wonder if the stent was uncovered rather than covered but I do not think that is the case.  I some of the pics I took really seem to show the covered portion pretty well as I was pulling on it. What do you think about another stent removal trial? ? ? ? ? ? ?

## 2021-08-31 ENCOUNTER — Other Ambulatory Visit: Payer: Self-pay

## 2021-08-31 DIAGNOSIS — K861 Other chronic pancreatitis: Secondary | ICD-10-CM

## 2021-08-31 DIAGNOSIS — K838 Other specified diseases of biliary tract: Secondary | ICD-10-CM

## 2021-08-31 DIAGNOSIS — K862 Cyst of pancreas: Secondary | ICD-10-CM

## 2021-08-31 NOTE — Telephone Encounter (Signed)
Lab order entered ?ERCP scheduled for 11/14/21 at [facility] with GM  ? ?Left message on machine to call back  ?

## 2021-08-31 NOTE — Telephone Encounter (Signed)
ERCP scheduled, pt instructed and medications reviewed.  Patient instructions mailed to home and sent to My Chart.  Patient to call with any questions or concerns. ? ?The pt will come in for labs in the next couple weeks.  Order entered  ? ?

## 2021-09-07 ENCOUNTER — Encounter (HOSPITAL_COMMUNITY): Payer: Medicaid Other | Admitting: Dentistry

## 2021-09-30 ENCOUNTER — Ambulatory Visit (INDEPENDENT_AMBULATORY_CARE_PROVIDER_SITE_OTHER): Payer: Medicaid Other | Admitting: Dentistry

## 2021-09-30 ENCOUNTER — Encounter (HOSPITAL_COMMUNITY): Payer: Self-pay | Admitting: Dentistry

## 2021-09-30 VITALS — BP 107/79 | HR 68 | Temp 98.1°F

## 2021-09-30 DIAGNOSIS — Z923 Personal history of irradiation: Secondary | ICD-10-CM | POA: Diagnosis not present

## 2021-09-30 DIAGNOSIS — Y842 Radiological procedure and radiotherapy as the cause of abnormal reaction of the patient, or of later complication, without mention of misadventure at the time of the procedure: Secondary | ICD-10-CM

## 2021-09-30 DIAGNOSIS — K036 Deposits [accretions] on teeth: Secondary | ICD-10-CM | POA: Diagnosis not present

## 2021-09-30 DIAGNOSIS — K029 Dental caries, unspecified: Secondary | ICD-10-CM

## 2021-09-30 DIAGNOSIS — K056 Periodontal disease, unspecified: Secondary | ICD-10-CM

## 2021-09-30 DIAGNOSIS — Z0120 Encounter for dental examination and cleaning without abnormal findings: Secondary | ICD-10-CM | POA: Diagnosis not present

## 2021-09-30 DIAGNOSIS — K08109 Complete loss of teeth, unspecified cause, unspecified class: Secondary | ICD-10-CM

## 2021-09-30 DIAGNOSIS — K117 Disturbances of salivary secretion: Secondary | ICD-10-CM

## 2021-09-30 NOTE — Progress Notes (Signed)
Department of Dental Medicine   Service Date:   09/30/2021  Patient Name:  Tanner Thomas Date of Birth:   [birth year] Medical Record Number: [id number]  TODAY'S VISIT: RECALL   PROCEDURES: Adult prophylaxis, updated radiographs, periodic exam ASSESSMENT: Soft tissue:  WNL Caries risk:  HIGH Periodontal impression:  Accretions on teeth, inflamed+erythematous gingival tissue Other findings:  New carious lesions PLAN/RECOMMENDATIONS: Rx:  Check on fluoride Rx- should have refills left Recall interval:  4 mos- prophy (10/23);  6 mos- periodic exams (update probing depths @ next periodic exam visit)     Next visit:  Restorative per updated tx plan   09/30/2021  HISTORY OF PRESENT ILLNESS: Tanner Thomas is a very pleasant 66 y.o. male with h/o anemia, tobacco use and floor of mouth cancer status-post tracheotomy, partial glossectomy and rim mandibulectomy, bilateral selective neck dissection zones 1 through 3, and right platysma myocutaneous flap for floor of mouth reconstruction as well as radiation therapy (finished 10/21/2020) who presents today for a periodic dental exam, updated radiographs and a cleaning. Medical and dental history were reviewed with the patient.  The patient reports that his upper partial denture has been doing very well since it was delivered in March 2023 and he has had no issues or sore spots needing adjustment.  His last dental recall visit was 03/23/2021.  He currently denies any dental/orofacial pain or sensitivity.  Patient is able to manage oral secretions.  Patient denies dysphagia, odynophagia, dysphonia, SOB and neck pain.  Patient denies fever, rigors and malaise.   CHIEF COMPLAINT: Here for a 6 month recall visit;  patient with no complaints.   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[place]) 08/23/2020   Sinus bradycardia    Lung nodule < 6cm on CT 07/05/2019   Iron deficiency anemia 03/05/2019   Past Medical History:  Diagnosis Date   Acute upper GI bleed 02/05/2020   Calculus of gallbladder without cholecystitis without obstruction    E coli Pyelonephritis 01/16/2019   Head and neck lymphadenopathy 05/26/2020   Tobacco use disorder 07/05/2019   Past Surgical History:  Procedure Laterality Date   BILIARY DILATION  10/30/2019   Procedure: BILIARY DILATION;  Surgeon: Milus Banister, MD;  Location: [facility];  Service: Endoscopy;;   BILIARY DILATION  02/05/2020   Procedure: BILIARY DILATION;  Surgeon: Milus Banister, MD;  Location: [facility];  Service: Endoscopy;;   BILIARY STENT PLACEMENT N/A 10/30/2019   Procedure: BILIARY STENT PLACEMENT;  Surgeon: Milus Banister, MD;  Location: [facility];  Service: Endoscopy;  Laterality: N/A;   BILIARY STENT PLACEMENT N/A 02/05/2020   Procedure: BILIARY STENT PLACEMENT;  Surgeon: Milus Banister, MD;  Location: [facility];  Service: Endoscopy;  Laterality: N/A;   BIOPSY  03/06/2019   Procedure: BIOPSY;  Surgeon: Daneil Dolin, MD;  Location: [facility];  Service: Endoscopy;;  gastric   BIOPSY  02/05/2020   Procedure: BIOPSY;  Surgeon: Milus Banister, MD;  Location: [facility];  Service: Endoscopy;;  bile buct brushing   CHOLECYSTECTOMY N/A 09/05/2019   Procedure: LAPAROSCOPIC CHOLECYSTECTOMY;  Surgeon: Arnoldo Morale,  Elta Guadeloupe, MD;  Location: [facility];  Service: General;  Laterality: N/A;   COLONOSCOPY WITH PROPOFOL N/A 03/06/2019   Procedure: COLONOSCOPY WITH PROPOFOL;  Surgeon: Daneil Dolin, MD;  [facility]: [facility];  Service: Endoscopy;  Laterality: N/A;  9:45am - ok at 10:00 per Melanie   ENDOSCOPIC  RETROGRADE CHOLANGIOPANCREATOGRAPHY (ERCP) WITH PROPOFOL N/A 10/30/2019   Procedure: ENDOSCOPIC RETROGRADE CHOLANGIOPANCREATOGRAPHY (ERCP) WITH PROPOFOL;  Surgeon: Milus Banister, MD;  Location: [facility];  Service: Endoscopy;  Laterality: N/A;   ENDOSCOPIC RETROGRADE CHOLANGIOPANCREATOGRAPHY (ERCP) WITH PROPOFOL N/A 02/05/2020   Procedure: ENDOSCOPIC RETROGRADE CHOLANGIOPANCREATOGRAPHY (ERCP) WITH PROPOFOL;  Surgeon: Milus Banister, MD;  Location: [facility];  Service: Endoscopy;  Laterality: N/A;   ERCP N/A 07/14/2021   Procedure: ENDOSCOPIC RETROGRADE CHOLANGIOPANCREATOGRAPHY (ERCP);  Surgeon: Milus Banister, MD;  Location: [facility];  Service: Endoscopy;  Laterality: N/A;   ESOPHAGOGASTRODUODENOSCOPY (EGD) WITH PROPOFOL N/A 03/06/2019   Procedure: ESOPHAGOGASTRODUODENOSCOPY (EGD) WITH PROPOFOL;  Surgeon: Daneil Dolin, MD;  [facility]: [facility];  Service: Endoscopy;  Laterality: N/A;   ESOPHAGOGASTRODUODENOSCOPY (EGD) WITH PROPOFOL N/A 08/21/2019   Procedure: ESOPHAGOGASTRODUODENOSCOPY (EGD) WITH PROPOFOL;  Surgeon: Milus Banister, MD;  Location: [facility];  Service: Endoscopy;  Laterality: N/A;   EUS N/A 08/21/2019   Procedure: UPPER ENDOSCOPIC ULTRASOUND (EUS) RADIAL;  Surgeon: Milus Banister, MD;  Location: [facility];  Service: Endoscopy;  Laterality: N/A;   PANCREAS SURGERY     REMOVAL OF STONES  10/30/2019   Procedure: REMOVAL OF STONES;  Surgeon: Milus Banister, MD;  Location: [facility];  Service: Endoscopy;;   REMOVAL OF STONES  02/05/2020   Procedure: REMOVAL OF STONES;  Surgeon: Milus Banister, MD;  Location: [facility];  Service: Endoscopy;;   REMOVAL OF STONES  07/14/2021   Procedure: REMOVAL OF SLUDGE;  Surgeon: Milus Banister, MD;  Location: [facility];  Service: Endoscopy;;   SPHINCTEROTOMY  10/30/2019   Procedure: [name];  Surgeon: Milus Banister, MD;  Location: [facility];  Service: Endoscopy;;   SPHINCTEROTOMY  02/05/2020    Procedure: [name];  Surgeon: Milus Banister, MD;  Location: [facility];  Service: Endoscopy;;   SPYGLASS CHOLANGIOSCOPY N/A 02/05/2020   Procedure: MEQASTMH CHOLANGIOSCOPY;  Surgeon: Milus Banister, MD;  Location: [facility];  Service: Endoscopy;  Laterality: N/A;   STENT REMOVAL  02/05/2020   Procedure: STENT REMOVAL;  Surgeon: Milus Banister, MD;  Location: [facility];  Service: Endoscopy;;   No Known Allergies Current Outpatient Medications  Medication Sig Dispense Refill   acetaminophen (TYLENOL) 500 MG tablet Take 500 mg by mouth every 6 (six) hours as needed for mild pain.     cholecalciferol (VITAMIN D3) 25 MCG (1000 UNIT) tablet Take 1,000 Units by mouth daily.     lidocaine (XYLOCAINE) 2 % solution Patient: Mix 1part 2% viscous lidocaine, 1part H20. Swish & swallow 57m of diluted mixture, 373m before meals and at bedtime, up to QID (Patient not taking: Reported on 07/11/2021) 300 mL 2   sodium fluoride (SODIUM FLUORIDE 5000 PPM) 1.1 % GEL dental gel Place 1 pea-size drop into each tooth space of fluoride trays once a day at bedtime.  Leave trays in for 5 minutes and then remove.  Spit out excess fluoride, but DO NOT rinse with water, eat or drink for at least 30 minutes after use. (Patient not taking: Reported on 07/11/2021) 100 mL 11   Sodium Fluoride (SODIUM FLUORIDE 5000 PPM) 1.1 % PSTE Take 1 application by mouth  in the morning and at bedtime. (Patient not taking: Reported on 07/11/2021) 96 g 11   No current facility-administered medications for this visit.    LABS: Lab Results  Component Value Date   WBC 4.9 05/03/2021   HGB 13.0 05/03/2021   HCT 38.6 05/03/2021   MCV 95 05/03/2021   PLT 144 (L) 05/03/2021      Component Value Date/Time   NA 140 05/03/2021 1005   K 4.9 05/03/2021 1005   CL 101 05/03/2021 1005   CO2 19 (L) 05/03/2021 1005   GLUCOSE 88 05/03/2021 1005   GLUCOSE 164 (H) 01/17/2021 0804   BUN 6 (L) 05/03/2021 1005   CREATININE 0.75 (L)  05/03/2021 1005   CREATININE 0.81 01/17/2021 0804   CALCIUM 9.0 05/03/2021 1005   GFRNONAA >60 01/17/2021 0804   GFRAA >60 09/03/2019 1459   Lab Results  Component Value Date   INR 1.2 09/03/2019   No results found for: "PTT"  Social History   Socioeconomic History   Marital status: Single    Spouse name: Not on file   Number of children: Not on file   Years of education: Not on file   Highest education level: Not on file  Occupational History   Not on file  Tobacco Use   Smoking status: Former    Packs/day: 0.00    Years: 50.00    Total pack years: 0.00    Types: Cigarettes    Start date: 05/24/1967    Quit date: 06/2020    Years since quitting: 1.2   Smokeless tobacco: Never  Vaping Use   Vaping Use: Never used  Substance and Sexual Activity   Alcohol use: Not Currently   Drug use: Not Currently    Types: Marijuana   Sexual activity: Yes  Other Topics Concern   Not on file  Social History Narrative   Not on file   Social Determinants of Health   Financial Resource Strain: High Risk (07/29/2020)   Overall Financial Resource Strain (CARDIA)    Difficulty of Paying Living Expenses: Very hard  Food Insecurity: No Food Insecurity (04/05/2021)   Hunger Vital Sign    Worried About Running Out of Food in the Last Year: Never true    Ran Out of Food in the Last Year: Never true  Transportation Needs: No Transportation Needs (05/05/2021)   PRAPARE - Hydrologist (Medical): No    Lack of Transportation (Non-Medical): No  Physical Activity: Not on file  Stress: Not on file  Social Connections: Moderately Integrated (05/05/2021)   Social Connection and Isolation Panel [NHANES]    Frequency of Communication with Friends and Family: More than three times a week    Frequency of Social Gatherings with Friends and Family: More than three times a week    Attends Religious Services: More than 4 times per year    Active Member of [employer] or  Organizations: Yes    Attends Music therapist: More than 4 times per year    Marital Status: Never married  Human resources officer Violence: Not on file   Family History  Problem Relation Age of Onset   Heart disease Father    Colon cancer Neg Hx    Gastric cancer Neg Hx    Esophageal cancer Neg Hx      REVIEW OF SYSTEMS:  Reviewed with the patient as per HPI. Psych:  Patient denies having dental phobia.   VITAL SIGNS: BP 107/79 (BP Location:  Right Arm, Patient Position: Sitting, Cuff Size: Normal)   Pulse 68   Temp 98.1 F (36.7 C) (Oral)    PHYSICAL EXAM:  Soft tissue exam completed and charted.  General:  Well-developed, comfortable and in no apparent distress. Neurological:  Alert and oriented to person, place and  time. Extraoral:  Head size, head shape, facial symmetry, skin, complexion, pigmentation, conjunctiva, oral labia, parotid gland, submandibular gland, thyroid, cervical and preauricular lymph nodes, submandibular and submental lymph nodes, tonsillar and occipital lymph nodes and supraclavicular lymph nodes WNL. No swelling or lymphadenopathy.  TMJ asymptomatic without clicks or crepitations. Trismus (denies symptoms associated with limited jaw opening). Intraoral:  Soft tissues appear well-perfused.  FOM and vestibules soft and not raised. Oral cavity without mass or lesion. No signs of infection, parulis, sinus tract, edema or erythema evident upon exam.  He continues to have exposed area on floor of mouth where previous surgical intervention was done for cancer- he confirms that he continues to follow with his ENT regarding this. (+) Mucous membranes:  Dry; thick, viscous saliva      HARD TISSUE:  Caries:  Restorable:  #5B(V), #6IDL, #10DL, #68MLDL, #13B(V)  Removable Prosthodontics:  Existing maxillary acrylic removable partial denture.  Occlusion:   Non-functional teeth:  All remaining teeth are non-functional      PERIODONTAL:  Plaque:   Generalized, heavy accumulation Calculus:  Generalized, mild accumulation Gingival appearance:  Inflamed, erythematous gingival tissue  Exam performed by Momin Misko B. Benson Norway, D.M.D. with Leta Speller, DAII acting as scribe.   RADIOGRAPHIC EXAM:  Updated radiographs included 3 Periapical images that were were exposed and interpreted.   Generalized mild-moderate horizontal bone loss consistent with mild-moderate periodontitis. Radiographic calculus evident. Missing teeth, existing restorations.  Caries- #6D, #10D, #68M&D.   ASSESSMENT:  1. Periodic dental exam and cleaning 2. History of floor of mouth cancer 3. History of radiation therapy to the head and neck 4.  Radiation caries 5.  Accretions on teeth 6.  Periodontal disease 7.  Missing teeth 8.  Xerostomia due to radiotherapy   PROCEDURES: Adult prophylaxis. Removed all supra- and subgingival calculus and plaque using Cavitron and hand instruments. Polished all teeth.  Flossed. Oral hygiene instruction discussed with the patient: Encouraged better brushing AND flossing; encouraged brushing at least twice a day and flossing once daily. Reminded patient that due to his extreme dry mouth, using fluoride trays only and not brushing and flossing efficiently every day, he will still develop cavities and periodontal disease will worsen rapidly.   He is doing a good job with compliance using his fluoride trays as directed and it is noticeable!  There is clinical improvement on his dental/oral health since he was first seen back in 12/2020. Toothbrush, toothpaste and floss given to the patient.   PLAN AND RECOMMENDATIONS: I explained all significant findings of the dental exam with the patient including several new cavities (small, but need to be filled due to his high caries risk), improved inflammation and bleeding of gingiva when cleaning, however still had significant calculus/plaque build-up, and the recommended care including  restorative and cleaning in 4 months to check.  The patient verbalized understanding of all findings, discussion, and recommendations.  Continue with 4 month recall interval (cleanings) & updated radiographs @ 6-23mo or as needed.  Periodic exams every 6 mos (next one we will update probing depths). Restorative (will likely require 2-3 visits) Partial denture adjustment(s) as needed  Call if any questions or concerns arise before next visit.  Next visit:  Restorative per updated treatment plan  All questions and concerns were invited and addressed.  The patient tolerated today's visit well and departed in stable condition.   Charlaine Dalton, D.M.D.

## 2021-10-10 DIAGNOSIS — K036 Deposits [accretions] on teeth: Secondary | ICD-10-CM | POA: Insufficient documentation

## 2021-10-10 DIAGNOSIS — Z0120 Encounter for dental examination and cleaning without abnormal findings: Secondary | ICD-10-CM | POA: Insufficient documentation

## 2021-10-10 DIAGNOSIS — K056 Periodontal disease, unspecified: Secondary | ICD-10-CM | POA: Insufficient documentation

## 2021-10-10 DIAGNOSIS — K08109 Complete loss of teeth, unspecified cause, unspecified class: Secondary | ICD-10-CM | POA: Insufficient documentation

## 2021-10-10 DIAGNOSIS — Y842 Radiological procedure and radiotherapy as the cause of abnormal reaction of the patient, or of later complication, without mention of misadventure at the time of the procedure: Secondary | ICD-10-CM | POA: Insufficient documentation

## 2021-10-31 DIAGNOSIS — R29898 Other symptoms and signs involving the musculoskeletal system: Secondary | ICD-10-CM | POA: Diagnosis not present

## 2021-11-04 ENCOUNTER — Encounter: Payer: Self-pay | Admitting: Internal Medicine

## 2021-11-04 ENCOUNTER — Ambulatory Visit (INDEPENDENT_AMBULATORY_CARE_PROVIDER_SITE_OTHER): Payer: Medicaid Other | Admitting: Internal Medicine

## 2021-11-04 VITALS — BP 98/82 | HR 76 | Resp 18 | Ht 70.0 in | Wt 119.2 lb

## 2021-11-04 DIAGNOSIS — E038 Other specified hypothyroidism: Secondary | ICD-10-CM | POA: Diagnosis not present

## 2021-11-04 DIAGNOSIS — E43 Unspecified severe protein-calorie malnutrition: Secondary | ICD-10-CM

## 2021-11-04 DIAGNOSIS — E559 Vitamin D deficiency, unspecified: Secondary | ICD-10-CM

## 2021-11-04 NOTE — Assessment & Plan Note (Signed)
Severe weight loss, likely due to poor dentition Now has dentures placed, advised to eat as tolerated Advised to take protein supplements

## 2021-11-04 NOTE — Patient Instructions (Signed)
Please continue taking protein supplements regularly.

## 2021-11-04 NOTE — Progress Notes (Signed)
Established Patient Office Visit  Subjective:  Patient ID: Tanner Thomas, male    DOB: 22-Jul-1955  Age: 66 y.o. MRN: [id number]  CC:  Chief Complaint  Patient presents with   Follow-up    3 month follow up     HPI Tanner Thomas is a 66 y.o. 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  His TSH was elevated at 8.15 in 04/23. He had had elevated TSH in the past well.  He denies any recent change in weight or appetite.  He has been taking protein supplements to improve his weight.  He still does not have lower dentures, currently followed by dental surgeon.  He is going to have ERCP on 07/24 for possible removal of bile duct stent.  Denies any nausea or vomiting currently.  Past Medical History:  Diagnosis Date   Acute upper GI bleed 02/05/2020   Calculus of gallbladder without cholecystitis without obstruction    E coli Pyelonephritis 01/16/2019   Head and neck lymphadenopathy 05/26/2020   Tobacco use disorder 07/05/2019    Past Surgical History:  Procedure Laterality Date   BILIARY DILATION  10/30/2019   Procedure: BILIARY DILATION;  Surgeon: Milus Banister, MD;  Location: [facility];  Service: Endoscopy;;   BILIARY DILATION  02/05/2020   Procedure: BILIARY DILATION;  Surgeon: Milus Banister, MD;  Location: [facility];  Service: Endoscopy;;   BILIARY STENT PLACEMENT N/A 10/30/2019   Procedure: BILIARY STENT PLACEMENT;  Surgeon: Milus Banister, MD;  Location: [facility];  Service: Endoscopy;  Laterality: N/A;   BILIARY STENT PLACEMENT N/A 02/05/2020   Procedure: BILIARY STENT PLACEMENT;  Surgeon: Milus Banister, MD;  Location: [facility];  Service: Endoscopy;  Laterality: N/A;   BIOPSY  03/06/2019   Procedure: BIOPSY;  Surgeon: Daneil Dolin, MD;  Location: [facility];  Service: Endoscopy;;  gastric   BIOPSY  02/05/2020    Procedure: BIOPSY;  Surgeon: Milus Banister, MD;  Location: [facility];  Service: Endoscopy;;  bile buct brushing   CHOLECYSTECTOMY N/A 09/05/2019   Procedure: LAPAROSCOPIC CHOLECYSTECTOMY;  Surgeon: Aviva Signs, MD;  Location: [facility];  Service: General;  Laterality: N/A;   COLONOSCOPY WITH PROPOFOL N/A 03/06/2019   Procedure: COLONOSCOPY WITH PROPOFOL;  Surgeon: Daneil Dolin, MD;  Location: [facility];  Service: Endoscopy;  Laterality: N/A;  9:45am - ok at 10:00 per Melanie   ENDOSCOPIC RETROGRADE CHOLANGIOPANCREATOGRAPHY (ERCP) WITH PROPOFOL N/A 10/30/2019   Procedure: ENDOSCOPIC RETROGRADE CHOLANGIOPANCREATOGRAPHY (ERCP) WITH PROPOFOL;  Surgeon: Milus Banister, MD;  Location: [facility];  Service: Endoscopy;  Laterality: N/A;   ENDOSCOPIC RETROGRADE CHOLANGIOPANCREATOGRAPHY (ERCP) WITH PROPOFOL N/A 02/05/2020   Procedure: ENDOSCOPIC RETROGRADE CHOLANGIOPANCREATOGRAPHY (ERCP) WITH PROPOFOL;  Surgeon: Milus Banister, MD;  Location: [facility];  Service: Endoscopy;  Laterality: N/A;   ERCP N/A 07/14/2021   Procedure: ENDOSCOPIC RETROGRADE CHOLANGIOPANCREATOGRAPHY (ERCP);  Surgeon: Milus Banister, MD;  Location: [facility];  Service: Endoscopy;  Laterality: N/A;   ESOPHAGOGASTRODUODENOSCOPY (EGD) WITH PROPOFOL N/A 03/06/2019   Procedure: ESOPHAGOGASTRODUODENOSCOPY (EGD) WITH PROPOFOL;  Surgeon: Daneil Dolin, MD;  Location: [facility];  Service: Endoscopy;  Laterality: N/A;   ESOPHAGOGASTRODUODENOSCOPY (EGD) WITH PROPOFOL N/A 08/21/2019   Procedure: ESOPHAGOGASTRODUODENOSCOPY (EGD) WITH PROPOFOL;  Surgeon: Milus Banister, MD;  Location: [facility];  Service: Endoscopy;  Laterality: N/A;   EUS N/A  08/21/2019   Procedure: UPPER ENDOSCOPIC ULTRASOUND (EUS) RADIAL;  Surgeon: Milus Banister, MD;  Location: [facility];  Service: Endoscopy;  Laterality: N/A;   PANCREAS SURGERY     REMOVAL OF STONES  10/30/2019   Procedure: REMOVAL OF STONES;  Surgeon: Milus Banister, MD;   Location: [facility];  Service: Endoscopy;;   REMOVAL OF STONES  02/05/2020   Procedure: REMOVAL OF STONES;  Surgeon: Milus Banister, MD;  Location: [facility];  Service: Endoscopy;;   REMOVAL OF STONES  07/14/2021   Procedure: REMOVAL OF SLUDGE;  Surgeon: Milus Banister, MD;  Location: [facility];  Service: Endoscopy;;   SPHINCTEROTOMY  10/30/2019   Procedure: [name];  Surgeon: Milus Banister, MD;  Location: [facility];  Service: Endoscopy;;   SPHINCTEROTOMY  02/05/2020   Procedure: [name];  Surgeon: Milus Banister, MD;  Location: [facility];  Service: Endoscopy;;   SPYGLASS CHOLANGIOSCOPY N/A 02/05/2020   Procedure: BEEFEOFH CHOLANGIOSCOPY;  Surgeon: Milus Banister, MD;  Location: [facility];  Service: Endoscopy;  Laterality: N/A;   STENT REMOVAL  02/05/2020   Procedure: STENT REMOVAL;  Surgeon: Milus Banister, MD;  Location: [facility];  Service: Endoscopy;;    Family History  Problem Relation Age of Onset   Heart disease Father    Colon cancer Neg Hx    Gastric cancer Neg Hx    Esophageal cancer Neg Hx     Social History   Socioeconomic History   Marital status: Single    Spouse name: Not on file   Number of children: Not on file   Years of education: Not on file   Highest education level: Not on file  Occupational History   Not on file  Tobacco Use   Smoking status: Former    Packs/day: 0.00    Years: 50.00    Total pack years: 0.00    Types: Cigarettes    Start date: 05/24/1967    Quit date: 06/2020    Years since quitting: 1.3   Smokeless tobacco: Never  Vaping Use   Vaping Use: Never used  Substance and Sexual Activity   Alcohol use: Not Currently   Drug use: Not Currently    Types: Marijuana   Sexual activity: Yes  Other Topics Concern   Not on file  Social History Narrative   Not on file   Social Determinants of Health   Financial Resource Strain: High Risk (07/29/2020)   Overall Financial Resource Strain (CARDIA)     Difficulty of Paying Living Expenses: Very hard  Food Insecurity: No Food Insecurity (04/05/2021)   Hunger Vital Sign    Worried About Running Out of Food in the Last Year: Never true    Ran Out of Food in the Last Year: Never true  Transportation Needs: No Transportation Needs (05/05/2021)   PRAPARE - Hydrologist (Medical): No    Lack of Transportation (Non-Medical): No  Physical Activity: Not on file  Stress: Not on file  Social Connections: Moderately Integrated (05/05/2021)   Social Connection and Isolation Panel [NHANES]    Frequency of Communication with Friends and Family: More than three times a week    Frequency of Social Gatherings with Friends and Family: More than three times a week    Attends Religious Services: More than 4 times per year    Active Member of [employer] or Organizations: Yes    Attends Archivist Meetings: More than 4 times per year  Marital Status: Never married  Human resources officer Violence: Not on file    Outpatient Medications Prior to Visit  Medication Sig Dispense Refill   acetaminophen (TYLENOL) 500 MG tablet Take 500 mg by mouth every 6 (six) hours as needed for mild pain.     cholecalciferol (VITAMIN D3) 25 MCG (1000 UNIT) tablet Take 1,000 Units by mouth daily.     lidocaine (XYLOCAINE) 2 % solution Patient: Mix 1part 2% viscous lidocaine, 1part H20. Swish & swallow 21m of diluted mixture, 313m before meals and at bedtime, up to QID 300 mL 2   sodium fluoride (SODIUM FLUORIDE 5000 PPM) 1.1 % GEL dental gel Place 1 pea-size drop into each tooth space of fluoride trays once a day at bedtime.  Leave trays in for 5 minutes and then remove.  Spit out excess fluoride, but DO NOT rinse with water, eat or drink for at least 30 minutes after use. 100 mL 11   Sodium Fluoride (SODIUM FLUORIDE 5000 PPM) 1.1 % PSTE Take 1 application by mouth in the morning and at bedtime. 96 g 11   No facility-administered medications prior  to visit.    No Known Allergies  ROS Review of Systems  Constitutional:  Negative for chills and fever.  HENT:  Negative for congestion and sore throat.   Eyes:  Negative for pain and discharge.  Respiratory:  Negative for cough and shortness of breath.   Cardiovascular:  Negative for chest pain and palpitations.  Gastrointestinal:  Negative for constipation, diarrhea, nausea and vomiting.  Endocrine: Negative for polydipsia and polyuria.  Genitourinary:  Negative for dysuria and hematuria.  Musculoskeletal:  Positive for arthralgias. Negative for neck pain and neck stiffness.  Skin:  Negative for rash.  Neurological:  Negative for dizziness, weakness, numbness and headaches.  Psychiatric/Behavioral:  Negative for agitation and behavioral problems.       Objective:    Physical Exam Vitals reviewed.  Constitutional:      General: He is not in acute distress.    Appearance: He is not diaphoretic.  HENT:     Head: Normocephalic and atraumatic.     Nose: Nose normal.     Mouth/Throat:     Mouth: Mucous membranes are moist.     Comments: S/p removal of lower teeth Eyes:     General: No scleral icterus.    Extraocular Movements: Extraocular movements intact.  Neck:     Comments: Neck incision site - C/D/I Cardiovascular:     Rate and Rhythm: Normal rate and regular rhythm.     Pulses: Normal pulses.     Heart sounds: Normal heart sounds. No murmur heard. Pulmonary:     Breath sounds: Normal breath sounds. No wheezing or rales.  Musculoskeletal:     Cervical back: Neck supple. No tenderness.     Right lower leg: No edema.     Left lower leg: No edema.  Skin:    General: Skin is warm.     Findings: No rash.  Neurological:     General: No focal deficit present.     Mental Status: He is alert and oriented to person, place, and time.     Sensory: No sensory deficit.     Motor: No weakness.  Psychiatric:        Mood and Affect: Mood normal.        Behavior: Behavior  normal.     BP 98/82 (BP Location: Left Arm, Patient Position: Sitting, Cuff Size: Normal)   Pulse  76   Resp 18   Ht '5\' 10"'  (1.778 m)   Wt 119 lb 3.2 oz (54.1 kg)   SpO2 95%   BMI 17.10 kg/m  Wt Readings from Last 3 Encounters:  11/04/21 119 lb 3.2 oz (54.1 kg)  08/04/21 117 lb 12.8 oz (53.4 kg)  07/14/21 122 lb (55.3 kg)    Lab Results  Component Value Date   TSH 8.150 (H) 08/04/2021   Lab Results  Component Value Date   WBC 4.9 05/03/2021   HGB 13.0 05/03/2021   HCT 38.6 05/03/2021   MCV 95 05/03/2021   PLT 144 (L) 05/03/2021   Lab Results  Component Value Date   NA 140 05/03/2021   K 4.9 05/03/2021   CO2 19 (L) 05/03/2021   GLUCOSE 88 05/03/2021   BUN 6 (L) 05/03/2021   CREATININE 0.75 (L) 05/03/2021   BILITOT 0.4 05/03/2021   ALKPHOS 83 05/03/2021   AST 65 (H) 05/03/2021   ALT 23 05/03/2021   PROT 7.6 05/03/2021   ALBUMIN 4.4 05/03/2021   CALCIUM 9.0 05/03/2021   ANIONGAP 12 01/17/2021   EGFR 101 05/03/2021   Lab Results  Component Value Date   CHOL 138 03/11/2019   Lab Results  Component Value Date   HDL 44 03/11/2019   Lab Results  Component Value Date   LDLCALC 76 03/11/2019   Lab Results  Component Value Date   TRIG 88 03/11/2019   Lab Results  Component Value Date   CHOLHDL 3.1 03/11/2019   Lab Results  Component Value Date   HGBA1C 5.8 (H) 03/11/2019      Assessment & Plan:   Problem List Items Addressed This Visit       Endocrine   Subclinical hypothyroidism - Primary    Lab Results  Component Value Date   TSH 8.150 (H) 08/04/2021  Free T4 WNL Currently patient is asymptomatic Check TSH and free T4 Check thyroid peroxidase        Other   Vitamin D deficiency    Continue to take vitamin D supplements      Severe protein-calorie malnutrition (HCC)    Severe weight loss, likely due to poor dentition Now has dentures placed, advised to eat as tolerated Advised to take protein supplements       No orders of  the defined types were placed in this encounter.   Follow-up: No follow-ups on file.    Lindell Spar, MD

## 2021-11-04 NOTE — Assessment & Plan Note (Addendum)
Lab Results  Component Value Date   TSH 8.150 (H) 08/04/2021   Free T4 WNL Currently patient is asymptomatic Check TSH and free T4 Check thyroid peroxidase

## 2021-11-04 NOTE — Assessment & Plan Note (Signed)
Continue to take vitamin D supplements

## 2021-11-05 LAB — THYROID PEROXIDASE ANTIBODY: Thyroperoxidase Ab SerPl-aCnc: <9 IU/mL (ref 0–34)

## 2021-11-05 LAB — TSH+FREE T4
Free T4: 0.83 ng/dL (ref 0.82–1.77)
TSH: 3.82 u[IU]/mL (ref 0.450–4.500)

## 2021-11-07 ENCOUNTER — Encounter (HOSPITAL_COMMUNITY): Payer: Self-pay | Admitting: Gastroenterology

## 2021-11-14 ENCOUNTER — Ambulatory Visit (HOSPITAL_COMMUNITY)
Admission: RE | Admit: 2021-11-14 | Discharge: 2021-11-14 | Disposition: A | Discharge status: Home / Self Care | Payer: Medicaid Other | Attending: Gastroenterology | Admitting: Gastroenterology

## 2021-11-14 ENCOUNTER — Ambulatory Visit (HOSPITAL_COMMUNITY): Payer: Medicaid Other

## 2021-11-14 ENCOUNTER — Encounter (HOSPITAL_COMMUNITY): Payer: Self-pay | Admitting: Gastroenterology

## 2021-11-14 ENCOUNTER — Ambulatory Visit (HOSPITAL_BASED_OUTPATIENT_CLINIC_OR_DEPARTMENT_OTHER): Payer: Medicaid Other | Admitting: Anesthesiology

## 2021-11-14 ENCOUNTER — Encounter (HOSPITAL_COMMUNITY)
Admission: RE | Disposition: A | Discharge status: Home / Self Care | Payer: Self-pay | Source: Home / Self Care | Attending: Gastroenterology

## 2021-11-14 ENCOUNTER — Ambulatory Visit (HOSPITAL_COMMUNITY): Payer: Medicaid Other | Admitting: Anesthesiology

## 2021-11-14 ENCOUNTER — Other Ambulatory Visit: Payer: Self-pay

## 2021-11-14 DIAGNOSIS — E039 Hypothyroidism, unspecified: Secondary | ICD-10-CM

## 2021-11-14 DIAGNOSIS — Z4659 Encounter for fitting and adjustment of other gastrointestinal appliance and device: Secondary | ICD-10-CM

## 2021-11-14 DIAGNOSIS — Z87891 Personal history of nicotine dependence: Secondary | ICD-10-CM | POA: Diagnosis not present

## 2021-11-14 DIAGNOSIS — K838 Other specified diseases of biliary tract: Secondary | ICD-10-CM | POA: Diagnosis not present

## 2021-11-14 DIAGNOSIS — K31A0 Gastric intestinal metaplasia, unspecified: Secondary | ICD-10-CM | POA: Insufficient documentation

## 2021-11-14 DIAGNOSIS — K862 Cyst of pancreas: Secondary | ICD-10-CM

## 2021-11-14 DIAGNOSIS — K861 Other chronic pancreatitis: Secondary | ICD-10-CM

## 2021-11-14 HISTORY — PX: STENT REMOVAL: SHX6421

## 2021-11-14 HISTORY — PX: BIOPSY: SHX5522

## 2021-11-14 HISTORY — PX: REMOVAL OF STONES: SHX5545

## 2021-11-14 HISTORY — PX: ENDOSCOPIC RETROGRADE CHOLANGIOPANCREATOGRAPHY (ERCP) WITH PROPOFOL: SHX5810

## 2021-11-14 SURGERY — ENDOSCOPIC RETROGRADE CHOLANGIOPANCREATOGRAPHY (ERCP) WITH PROPOFOL
Anesthesia: General

## 2021-11-14 MED ORDER — GLUCAGON HCL RDNA (DIAGNOSTIC) 1 MG IJ SOLR
INTRAMUSCULAR | Status: AC
  Filled 2021-11-14: qty 1

## 2021-11-14 MED ORDER — DICLOFENAC SUPPOSITORY 100 MG
RECTAL | Status: AC
  Filled 2021-11-14: qty 1

## 2021-11-14 MED ORDER — SODIUM CHLORIDE 0.9 % IV SOLN: INTRAVENOUS | Status: DC

## 2021-11-14 MED ORDER — LIDOCAINE 2% (20 MG/ML) 5 ML SYRINGE
INTRAMUSCULAR | Status: DC | PRN
  Administered 2021-11-14: 40 mg via INTRAVENOUS

## 2021-11-14 MED ORDER — PHENYLEPHRINE HCL (PRESSORS) 10 MG/ML IV SOLN
INTRAVENOUS | Status: AC
  Filled 2021-11-14: qty 1

## 2021-11-14 MED ORDER — SODIUM CHLORIDE 0.9 % IV SOLN
INTRAVENOUS | Status: DC | PRN
  Administered 2021-11-14: 15 mL

## 2021-11-14 MED ORDER — CIPROFLOXACIN IN D5W 400 MG/200ML IV SOLN
INTRAVENOUS | Status: AC
  Filled 2021-11-14: qty 200

## 2021-11-14 MED ORDER — PHENYLEPHRINE 80 MCG/ML (10ML) SYRINGE FOR IV PUSH (FOR BLOOD PRESSURE SUPPORT)
PREFILLED_SYRINGE | INTRAVENOUS | Status: DC | PRN
  Administered 2021-11-14: 80 ug via INTRAVENOUS
  Administered 2021-11-14: 240 ug via INTRAVENOUS

## 2021-11-14 MED ORDER — DEXAMETHASONE SODIUM PHOSPHATE 10 MG/ML IJ SOLN
INTRAMUSCULAR | Status: DC | PRN
  Administered 2021-11-14: 10 mg via INTRAVENOUS

## 2021-11-14 MED ORDER — ROCURONIUM BROMIDE 10 MG/ML (PF) SYRINGE
PREFILLED_SYRINGE | INTRAVENOUS | Status: DC | PRN
  Administered 2021-11-14: 60 mg via INTRAVENOUS

## 2021-11-14 MED ORDER — LACTATED RINGERS IV SOLN
INTRAVENOUS | Status: AC | PRN
Start: 2021-11-14 — End: 2021-11-14
  Administered 2021-11-14: 1000 mL via INTRAVENOUS

## 2021-11-14 MED ORDER — FENTANYL CITRATE (PF) 100 MCG/2ML IJ SOLN
INTRAMUSCULAR | Status: DC | PRN
  Administered 2021-11-14 (×2): 50 ug via INTRAVENOUS

## 2021-11-14 MED ORDER — CIPROFLOXACIN IN D5W 400 MG/200ML IV SOLN
INTRAVENOUS | Status: DC | PRN
  Administered 2021-11-14: 400 mg via INTRAVENOUS

## 2021-11-14 MED ORDER — PHENYLEPHRINE HCL-NACL 20-0.9 MG/250ML-% IV SOLN
INTRAVENOUS | Status: DC | PRN
  Administered 2021-11-14: 50 ug/min via INTRAVENOUS

## 2021-11-14 MED ORDER — PROPOFOL 10 MG/ML IV BOLUS
INTRAVENOUS | Status: DC | PRN
  Administered 2021-11-14: 150 mg via INTRAVENOUS

## 2021-11-14 MED ORDER — PROPOFOL 10 MG/ML IV BOLUS
INTRAVENOUS | Status: AC
  Filled 2021-11-14: qty 20

## 2021-11-14 MED ORDER — ONDANSETRON HCL 4 MG/2ML IJ SOLN
INTRAMUSCULAR | Status: DC | PRN
  Administered 2021-11-14: 4 mg via INTRAVENOUS

## 2021-11-14 MED ORDER — DICLOFENAC SUPPOSITORY 100 MG
RECTAL | Status: DC | PRN
  Administered 2021-11-14: 100 mg via RECTAL

## 2021-11-14 MED ORDER — FENTANYL CITRATE (PF) 100 MCG/2ML IJ SOLN
INTRAMUSCULAR | Status: AC
  Filled 2021-11-14: qty 2

## 2021-11-14 MED ORDER — SUGAMMADEX SODIUM 200 MG/2ML IV SOLN
INTRAVENOUS | Status: DC | PRN
  Administered 2021-11-14: 200 mg via INTRAVENOUS

## 2021-11-14 NOTE — Discharge Instructions (Signed)
YOU HAD AN ENDOSCOPIC PROCEDURE TODAY: Refer to the procedure report and other information in the discharge instructions given to you for any specific questions about what was found during the examination. If this information does not answer your questions, please call [place] office at [phone] to clarify.   YOU SHOULD EXPECT: Some feelings of bloating in the abdomen. Passage of more gas than usual. Walking can help get rid of the air that was put into your GI tract during the procedure and reduce the bloating. If you had a lower endoscopy (such as a colonoscopy or flexible sigmoidoscopy) you may notice spotting of blood in your stool or on the toilet paper. Some abdominal soreness may be present for a day or two, also.  DIET: Your first meal following the procedure should be a light meal and then it is ok to progress to your normal diet. A half-sandwich or bowl of soup is an example of a good first meal. Heavy or fried foods are harder to digest and may make you feel nauseous or bloated. Drink plenty of fluids but you should avoid alcoholic beverages for 24 hours. If you had a esophageal dilation, please see attached instructions for diet.    ACTIVITY: Your care partner should take you home directly after the procedure. You should plan to take it easy, moving slowly for the rest of the day. You can resume normal activity the day after the procedure however YOU SHOULD NOT DRIVE, use power tools, machinery or perform tasks that involve climbing or major physical exertion for 24 hours (because of the sedation medicines used during the test).   SYMPTOMS TO REPORT IMMEDIATELY: A gastroenterologist can be reached at any hour. Please call [phone]  for any of the following symptoms:   Following upper endoscopy (EGD, EUS, ERCP, esophageal dilation) Vomiting of blood or coffee ground material  New, significant abdominal pain  New, significant chest pain or pain under the shoulder blades  Painful or  persistently difficult swallowing  New shortness of breath  Black, tarry-looking or red, bloody stools  FOLLOW UP:  If any biopsies were taken you will be contacted by phone or by letter within the next 1-3 weeks. Call [phone]  if you have not heard about the biopsies in 3 weeks.  Please also call with any specific questions about appointments or follow up tests.  

## 2021-11-14 NOTE — Anesthesia Preprocedure Evaluation (Signed)
Anesthesia Evaluation  Patient identified by MRN, date of birth, ID band Patient awake    Reviewed: Allergy & Precautions, NPO status , Patient's Chart, lab work & pertinent test results  History of Anesthesia Complications Negative for: history of anesthetic complications  Airway Mallampati: II  TM Distance: >3 FB Neck ROM: Full    Dental  (+) Partial Lower, Partial Upper, Dental Advisory Given, Missing,    Pulmonary neg shortness of breath, neg sleep apnea, neg COPD, neg recent URI, former smoker,    breath sounds clear to auscultation       Cardiovascular negative cardio ROS   Rhythm:Regular     Neuro/Psych negative neurological ROS  negative psych ROS   GI/Hepatic negative GI ROS, Biliary stent   Endo/Other  Hypothyroidism   Renal/GU negative Renal ROS     Musculoskeletal negative musculoskeletal ROS (+)   Abdominal Normal abdominal exam  (+)   Peds  Hematology negative hematology ROS (+) Lab Results      Component                Value               Date                      WBC                      4.9                 05/03/2021                HGB                      13.0                05/03/2021                HCT                      38.6                05/03/2021                MCV                      95                  05/03/2021                PLT                      144 (L)             05/03/2021              Anesthesia Other Findings   Reproductive/Obstetrics                            Anesthesia Physical Anesthesia Plan  ASA: 3  Anesthesia Plan: General   Post-op Pain Management: Minimal or no pain anticipated   Induction: Intravenous  PONV Risk Score and Plan: 2 and Ondansetron and Dexamethasone  Airway Management Planned: Oral ETT  Additional Equipment: None  Intra-op Plan:   Post-operative Plan: Extubation in OR  Informed Consent: I have reviewed  the patients History and Physical, chart, labs and discussed the  procedure including the risks, benefits and alternatives for the proposed anesthesia with the patient or authorized representative who has indicated his/her understanding and acceptance.     Dental advisory given  Plan Discussed with: CRNA  Anesthesia Plan Comments:         Anesthesia Quick Evaluation

## 2021-11-14 NOTE — H&P (Signed)
GASTROENTEROLOGY PROCEDURE H&P NOTE   Primary Care Physician: Lindell Spar, MD  HPI: Tanner Thomas is a 66 y.o. male who presents for ERCP for attempt proximally migrated fully covered self-expanding metal stent removal after prior failed attempt.  Past Medical History:  Diagnosis Date   Acute upper GI bleed 02/05/2020   Calculus of gallbladder without cholecystitis without obstruction    E coli Pyelonephritis 01/16/2019   Head and neck lymphadenopathy 05/26/2020   Tobacco use disorder 07/05/2019   Past Surgical History:  Procedure Laterality Date   BILIARY DILATION  10/30/2019   Procedure: BILIARY DILATION;  Surgeon: Milus Banister, MD;  Location: [facility];  Service: Endoscopy;;   BILIARY DILATION  02/05/2020   Procedure: BILIARY DILATION;  Surgeon: Milus Banister, MD;  Location: [facility];  Service: Endoscopy;;   BILIARY STENT PLACEMENT N/A 10/30/2019   Procedure: BILIARY STENT PLACEMENT;  Surgeon: Milus Banister, MD;  Location: [facility];  Service: Endoscopy;  Laterality: N/A;   BILIARY STENT PLACEMENT N/A 02/05/2020   Procedure: BILIARY STENT PLACEMENT;  Surgeon: Milus Banister, MD;  Location: [facility];  Service: Endoscopy;  Laterality: N/A;   BIOPSY  03/06/2019   Procedure: BIOPSY;  Surgeon: Daneil Dolin, MD;  Location: [facility];  Service: Endoscopy;;  gastric   BIOPSY  02/05/2020   Procedure: BIOPSY;  Surgeon: Milus Banister, MD;  Location: [facility];  Service: Endoscopy;;  bile buct brushing   CHOLECYSTECTOMY N/A 09/05/2019   Procedure: LAPAROSCOPIC CHOLECYSTECTOMY;  Surgeon: Aviva Signs, MD;  Location: [facility];  Service: General;  Laterality: N/A;   COLONOSCOPY WITH PROPOFOL N/A 03/06/2019   Procedure: COLONOSCOPY WITH PROPOFOL;  Surgeon: Daneil Dolin, MD;  [facility];  Service: Endoscopy;  Laterality: N/A;  9:45am - ok at 10:00 per Melanie   ENDOSCOPIC RETROGRADE CHOLANGIOPANCREATOGRAPHY (ERCP) WITH PROPOFOL N/A 10/30/2019    Procedure: ENDOSCOPIC RETROGRADE CHOLANGIOPANCREATOGRAPHY (ERCP) WITH PROPOFOL;  Surgeon: Milus Banister, MD;  Location: [facility];  Service: Endoscopy;  Laterality: N/A;   ENDOSCOPIC RETROGRADE CHOLANGIOPANCREATOGRAPHY (ERCP) WITH PROPOFOL N/A 02/05/2020   Procedure: ENDOSCOPIC RETROGRADE CHOLANGIOPANCREATOGRAPHY (ERCP) WITH PROPOFOL;  Surgeon: Milus Banister, MD;  Location: [facility];  Service: Endoscopy;  Laterality: N/A;   ERCP N/A 07/14/2021   Procedure: ENDOSCOPIC RETROGRADE CHOLANGIOPANCREATOGRAPHY (ERCP);  Surgeon: Milus Banister, MD;  Location: [facility];  Service: Endoscopy;  Laterality: N/A;   ESOPHAGOGASTRODUODENOSCOPY (EGD) WITH PROPOFOL N/A 03/06/2019   Procedure: ESOPHAGOGASTRODUODENOSCOPY (EGD) WITH PROPOFOL;  Surgeon: Daneil Dolin, MD;  [facility];  Service: Endoscopy;  Laterality: N/A;   ESOPHAGOGASTRODUODENOSCOPY (EGD) WITH PROPOFOL N/A 08/21/2019   Procedure: ESOPHAGOGASTRODUODENOSCOPY (EGD) WITH PROPOFOL;  Surgeon: Milus Banister, MD;  Location: [facility];  Service: Endoscopy;  Laterality: N/A;   EUS N/A 08/21/2019   Procedure: UPPER ENDOSCOPIC ULTRASOUND (EUS) RADIAL;  Surgeon: Milus Banister, MD;  Location: [facility];  Service: Endoscopy;  Laterality: N/A;   PANCREAS SURGERY     REMOVAL OF STONES  10/30/2019   Procedure: REMOVAL OF STONES;  Surgeon: Milus Banister, MD;  Location: [facility];  Service: Endoscopy;;   REMOVAL OF STONES  02/05/2020   Procedure: REMOVAL OF STONES;  Surgeon: Milus Banister, MD;  Location: [facility];  Service: Endoscopy;;   REMOVAL OF STONES  07/14/2021   Procedure: REMOVAL OF SLUDGE;  Surgeon: Milus Banister, MD;  Location: [facility];  Service: Endoscopy;;   SPHINCTEROTOMY  10/30/2019   Procedure: [name];  Surgeon:  Milus Banister, MD;  Location: [facility];  Service: Endoscopy;;   SPHINCTEROTOMY  02/05/2020   Procedure: [name];  Surgeon: Milus Banister, MD;  Location: [facility];  Service: Endoscopy;;   [id number] CHOLANGIOSCOPY N/A 02/05/2020   Procedure: [id number] CHOLANGIOSCOPY;  Surgeon: Milus Banister, MD;  Location: [facility];  Service: Endoscopy;  Laterality: N/A;   STENT REMOVAL  02/05/2020   Procedure: STENT REMOVAL;  Surgeon: Milus Banister, MD;  Location: [facility];  Service: Endoscopy;;   Current Facility-Administered Medications  Medication Dose Route Frequency Provider Last Rate Last Admin   0.9 %  sodium chloride infusion   Intravenous Continuous Mansouraty, Telford Nab., MD       lactated ringers infusion    Continuous PRN Mansouraty, Telford Nab., MD 50 mL/hr at 11/14/21 0753 1,000 mL at 11/14/21 0753    Current Facility-Administered Medications:    0.9 %  sodium chloride infusion, , Intravenous, Continuous, Mansouraty, Telford Nab., MD   lactated ringers infusion, , , Continuous PRN, Mansouraty, Telford Nab., MD, Last Rate: 50 mL/hr at 11/14/21 0753, 1,000 mL at 11/14/21 0753 No Known Allergies Family History  Problem Relation Age of Onset   Heart disease Father    Colon cancer Neg Hx    Gastric cancer Neg Hx    Esophageal cancer Neg Hx    Social History   Socioeconomic History   Marital status: Single    Spouse name: Not on file   Number of children: Not on file   Years of education: Not on file   Highest education level: Not on file  Occupational History   Not on file  Tobacco Use   Smoking status: Former    Packs/day: 0.00    Years: 50.00    Total pack years: 0.00    Types: Cigarettes    Start date: 05/24/1967    Quit date: 06/2020    Years since quitting: 1.3   Smokeless tobacco: Never  Vaping Use   Vaping Use: Never used  Substance and Sexual Activity   Alcohol use: Not Currently   Drug use: Not Currently    Types: Marijuana   Sexual activity: Yes  Other Topics Concern   Not on file  Social History Narrative   Not on file   Social Determinants of Health   Financial Resource Strain: High Risk  (07/29/2020)   Overall Financial Resource Strain (CARDIA)    Difficulty of Paying Living Expenses: Very hard  Food Insecurity: No Food Insecurity (04/05/2021)   Hunger Vital Sign    Worried About Running Out of Food in the Last Year: Never true    Ran Out of Food in the Last Year: Never true  Transportation Needs: No Transportation Needs (05/05/2021)   PRAPARE - Hydrologist (Medical): No    Lack of Transportation (Non-Medical): No  Physical Activity: Not on file  Stress: Not on file  Social Connections: Moderately Integrated (05/05/2021)   Social Connection and Isolation Panel [NHANES]    Frequency of Communication with Friends and Family: More than three times a week    Frequency of Social Gatherings with Friends and Family: More than three times a week    Attends Religious Services: More than 4 times per year    Active Member of [employer] or Organizations: Yes    Attends Music therapist: More than 4 times per year    Marital Status: Never married  Intimate Partner Violence: Not on file  Physical Exam: Today's Vitals   11/14/21 0742  BP: (!) 155/96  Resp: (!) 22  Temp: 97.7 F (36.5 C)  TempSrc: Oral  SpO2: 100%  Weight: 54.4 kg  Height: '5\' 10"'$  (1.778 m)  PainSc: 0-No pain   Body mass index is 17.22 kg/m. GEN: NAD EYE: Sclerae anicteric ENT: MMM CV: Non-tachycardic GI: Soft, NT/ND NEURO:  Alert & Oriented x 3  Lab Results: No results for input(s): "WBC", "HGB", "HCT", "PLT" in the last 72 hours. BMET No results for input(s): "NA", "K", "CL", "CO2", "GLUCOSE", "BUN", "CREATININE", "CALCIUM" in the last 72 hours. LFT No results for input(s): "PROT", "ALBUMIN", "AST", "ALT", "ALKPHOS", "BILITOT", "BILIDIR", "IBILI" in the last 72 hours. PT/INR No results for input(s): "LABPROT", "INR" in the last 72 hours.   Impression / Plan: This is a 66 y.o.male who presents for ERCP for attempt proximally migrated fully covered  self-expanding metal stent removal after prior failed attempt.  The risks of an ERCP were discussed at length, including but not limited to the risk of perforation, bleeding, abdominal pain, post-ERCP pancreatitis (while usually mild can be severe and even life threatening).   The risks and benefits of endoscopic evaluation/treatment were discussed with the patient and/or family; these include but are not limited to the risk of perforation, infection, bleeding, missed lesions, lack of diagnosis, severe illness requiring hospitalization, as well as anesthesia and sedation related illnesses.  The patient's history has been reviewed, patient examined, no change in status, and deemed stable for procedure.  The patient and/or family is agreeable to proceed.    Justice Britain, MD [facility] Office # [phone]

## 2021-11-14 NOTE — Op Note (Signed)
[facility] Patient Name: Tanner Thomas Procedure Date: 11/14/2021 MRN: [id number] Attending MD: Justice Britain , MD Date of Birth: 1955-09-05 CSN: [id number] Age: 66 Admit Type: Outpatient Procedure:                ERCP Indications:              Biliary stent removal Providers:                Justice Britain, MD, Burtis Junes, RN, Luan Moore, Technician, Derrek Gu. Alday CRNA, CRNA Referring MD:             Milus Banister, MD, Colin Broach. Patel Medicines:                General Anesthesia, Cipro 400 mg IV, Diclofenac 100                            mg rectal Complications:            No immediate complications. Estimated Blood Loss:     Estimated blood loss was minimal. Procedure:                Pre-Anesthesia Assessment:                           - Prior to the procedure, a History and Physical                            was performed, and patient medications and                            allergies were reviewed. The patient's tolerance of                            previous anesthesia was also reviewed. The risks                            and benefits of the procedure and the sedation                            options and risks were discussed with the patient.                            All questions were answered, and informed consent                            was obtained. Prior Anticoagulants: The patient has                            taken no previous anticoagulant or antiplatelet                            agents. ASA Grade Assessment: III - A patient with  severe systemic disease. After reviewing the risks                            and benefits, the patient was deemed in                            satisfactory condition to undergo the procedure.                           After obtaining informed consent, the scope was                            passed under direct vision. Throughout the                             procedure, the patient's blood pressure, pulse, and                            oxygen saturations were monitored continuously. The                            TJF-Q190V ([id number] Olympus duodenoscope was                            introduced through the mouth, and used to inject                            contrast into and used to inject contrast into the                            bile duct. The ERCP was accomplished without                            difficulty. The patient tolerated the procedure. Scope In: Scope Out: Findings:      A scout film of the abdomen was obtained. Surgical clips, consistent       with previous cholecystectomy, were seen in the area of the right upper       quadrant of the abdomen. One stent ending in the main bile duct was seen.      The esophagus was successfully intubated under direct vision without       detailed examination of the pharynx, larynx, and associated structures,       and upper GI tract. The major papilla was congested. A biliary       sphincterotomy had been performed. The sphincterotomy appeared open. One       covered metal biliary stent originating in the biliary tree was emerging       from the major papilla with evidence of some foreshortening as well as       inverted distal flange. A short 0.035 inch Soft Jagwire was passed into       the biliary tree through the FCSEMS. The Hydratome sphincterotome was       passed over the guidewire and the bile duct was then deeply cannulated.       Contrast was injected. I personally interpreted the bile duct  images.       Ductal flow of contrast was adequate. Image quality was adequate.       Contrast extended to the cystic duct. Contrast extended to the hepatic       ducts. Opacification of the main bile duct was successful. The maximum       diameter of the ducts was 14 mm. The biliary tree was swept with a       retrieval balloon starting at the bifurcation. Sludge was swept  from the       duct. The biliary stent began to move distally towards the duodenum but       further balloon pullthroughs did not allow it to disengage from the       biliary tract. One stent remained. One stent was removed from the       biliary tree using a Raptor grasping device and then this was sent for       cytology purposes. A short 0.035 inch Soft Jagwire was then passed into       the biliary tree. The Hydratome sphincterotome was passed over the       guidewire and the bile duct was then deeply cannulated. Contrast was       injected. Opacification of the entire biliary tree except for the       gallbladder was successful. The biliary tree was swept with a retrieval       balloon starting at the bifurcation. Sludge was swept from the duct. An       occlusion cholangiogram was performed that showed no further significant       biliary pathology.      A pancreatogram was not performed.      Biopsies were taken in the ampulla through the ERCP scope with the cold       forceps for histology.      The duodenoscope was withdrawn from the patient. Impression:               - The major papilla appeared congested.                           - Prior biliary sphincterotomy appeared open.                           - One stent from the biliary tree was seen in the                            major papilla - this was removed as noted above                            with balloon pullthroughs and with raptor grasper.                           - Biopsies were taken with a cold forceps for                            histology from the ampulla.                           - The biliary tree was swept and sludge was found. Moderate Sedation:  Not Applicable - Patient had care per Anesthesia. Recommendation:           - The patient will be observed post-procedure,                            until all discharge criteria are met.                           - Discharge patient to home.                            - Patient has a contact number available for                            emergencies. The signs and symptoms of potential                            delayed complications were discussed with the                            patient. Return to normal activities tomorrow.                            Written discharge instructions were provided to the                            patient.                           - Low fat diet for 1 week.                           - Observe patient's clinical course.                           - Await cytology results and await path results.                           - Check liver enzymes (AST, ALT, alkaline                            phosphatase, bilirubin) in 2 weeks.                           - Watch for pancreatitis, bleeding, perforation,                            and cholangitis.                           - Return to GI clinic.                           - The findings and recommendations were discussed  with the patient.                           - The findings and recommendations were discussed                            with the patient's family. Procedure Code(s):        --- Professional ---                           [id number], Endoscopic retrograde                            cholangiopancreatography (ERCP); with removal of                            foreign body(s) or stent(s) from biliary/pancreatic                            duct(s)                           43264, Endoscopic retrograde                            cholangiopancreatography (ERCP); with removal of                            calculi/debris from biliary/pancreatic duct(s)                           43261, Endoscopic retrograde                            cholangiopancreatography (ERCP); with biopsy,                            single or multiple Diagnosis Code(s):        --- Professional ---                           Z96.89, Presence of other specified  functional                            implants                           Z46.59, Encounter for fitting and adjustment of                            other gastrointestinal appliance and device                           K83.8, Other specified diseases of biliary tract CPT copyright 2019 American Medical Association. All rights reserved. The codes documented in this report are preliminary and upon coder review may  be revised to meet current compliance requirements. Justice Britain, MD 11/14/2021 11:54:07 AM Number of Addenda: 0

## 2021-11-14 NOTE — Anesthesia Procedure Notes (Signed)
Procedure Name: Intubation Date/Time: 11/14/2021 11:04 AM  Performed by: Lind Covert, CRNAPre-anesthesia Checklist: Patient identified, Emergency Drugs available, Suction available, Patient being monitored and Timeout performed Patient Re-evaluated:Patient Re-evaluated prior to induction Oxygen Delivery Method: Circle system utilized Preoxygenation: Pre-oxygenation with 100% oxygen Induction Type: IV induction Ventilation: Mask ventilation without difficulty Laryngoscope Size: Mac and 4 Grade View: Grade II Tube type: Oral Tube size: 7.5 mm Number of attempts: 2 (1st attempt per paramedic student, Esophageal intubation. ETT removed patient ventilated. 2nd attempt easy intubation with MAC 4 blade per CRNA) Airway Equipment and Method: Stylet Placement Confirmation: ETT inserted through vocal cords under direct vision, positive ETCO2 and breath sounds checked- equal and bilateral Secured at: 23 cm Tube secured with: Tape Dental Injury: Teeth and Oropharynx as per pre-operative assessment

## 2021-11-14 NOTE — Transfer of Care (Signed)
Immediate Anesthesia Transfer of Care Note  Patient: Tanner Thomas  Procedure(s) Performed: ENDOSCOPIC RETROGRADE CHOLANGIOPANCREATOGRAPHY (ERCP) WITH PROPOFOL STENT REMOVAL BIOPSY REMOVAL OF SLUDGE  Patient Location: PACU  Anesthesia Type:General  Level of Consciousness: awake, alert  and oriented  Airway & Oxygen Therapy: Patient Spontanous Breathing and Patient connected to face mask oxygen  Post-op Assessment: Report given to RN, Post -op Vital signs reviewed and stable and Patient moving all extremities X 4  Post vital signs: Reviewed and stable  Last Vitals:  Vitals Value Taken Time  BP 158/84   Temp    Pulse 68   Resp 12   SpO2 100     Last Pain:  Vitals:   11/14/21 0742  TempSrc: Oral  PainSc: 0-No pain         Complications: No notable events documented.

## 2021-11-15 ENCOUNTER — Other Ambulatory Visit: Payer: Self-pay

## 2021-11-15 ENCOUNTER — Telehealth: Payer: Self-pay

## 2021-11-15 ENCOUNTER — Encounter: Payer: Self-pay | Admitting: Gastroenterology

## 2021-11-15 LAB — CYTOLOGY - NON PAP

## 2021-11-15 LAB — SURGICAL PATHOLOGY

## 2021-11-15 MED ORDER — FLUCONAZOLE 100 MG PO TABS: ORAL_TABLET | ORAL | 0 refills | Status: AC

## 2021-11-15 NOTE — Telephone Encounter (Signed)
Lab order entered and pt aware

## 2021-11-15 NOTE — Telephone Encounter (Signed)
Hepatic function panel in 2 weeks.  Follow-up in clinic with DJ in 2 to 3 months as long as LFTs come back normal.  Thanks.  GM

## 2021-11-16 ENCOUNTER — Other Ambulatory Visit: Payer: Self-pay

## 2021-11-16 ENCOUNTER — Telehealth: Payer: Self-pay | Admitting: Gastroenterology

## 2021-11-16 ENCOUNTER — Encounter (HOSPITAL_COMMUNITY): Payer: Medicaid - Dental | Admitting: Dentistry

## 2021-11-16 ENCOUNTER — Encounter (HOSPITAL_COMMUNITY): Payer: Self-pay | Admitting: Gastroenterology

## 2021-11-16 DIAGNOSIS — K838 Other specified diseases of biliary tract: Secondary | ICD-10-CM

## 2021-11-16 NOTE — Anesthesia Postprocedure Evaluation (Signed)
Anesthesia Post Note  Patient: Tanner Thomas  Procedure(s) Performed: ENDOSCOPIC RETROGRADE CHOLANGIOPANCREATOGRAPHY (ERCP) WITH PROPOFOL STENT REMOVAL BIOPSY REMOVAL OF SLUDGE     Patient location during evaluation: Endoscopy Anesthesia Type: General Level of consciousness: awake Pain management: pain level controlled Vital Signs Assessment: post-procedure vital signs reviewed and stable Respiratory status: spontaneous breathing, nonlabored ventilation and respiratory function stable Cardiovascular status: blood pressure returned to baseline and stable Postop Assessment: no apparent nausea or vomiting Anesthetic complications: no   No notable events documented.  Last Vitals:  Vitals:   11/14/21 1210 11/14/21 1220  BP: (!) 149/90 (!) 149/90  Pulse: (!) 59 (!) 53  Resp: 15 12  Temp:    SpO2: 100% 98%    Last Pain:  Vitals:   11/14/21 1220  TempSrc:   PainSc: 0-No pain                 Keileigh Vahey

## 2022-01-11 IMAGING — DX DG CHEST 1V PORT
1 series · 1 of 1 positions shown · non-contrast
Comparison: 01/19/2019

CLINICAL DATA: Post ERCP.  Acute pancreatitis.

EXAM:
PORTABLE CHEST 1 VIEW

[chest ap]
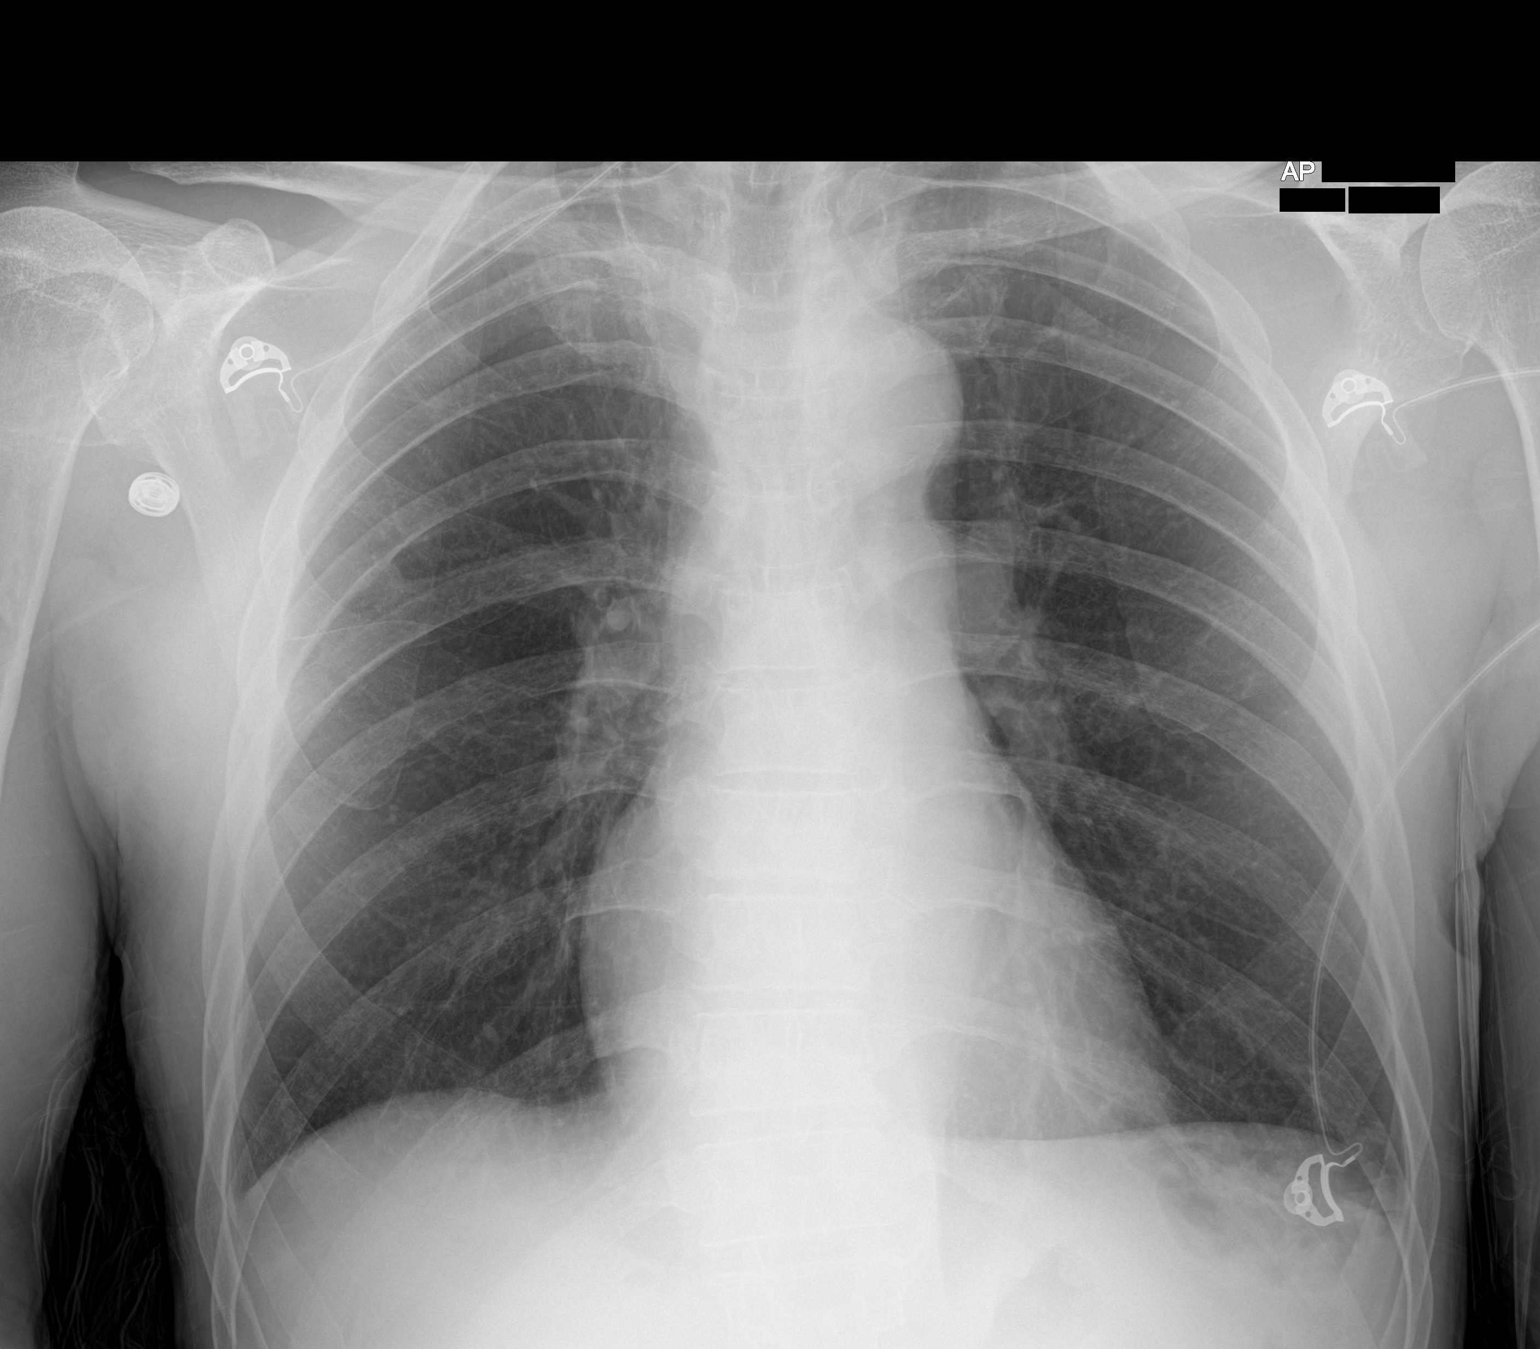

[1 of 1 positions shown; findings below may reference images not displayed]

FINDINGS: The heart size and mediastinal contours are within normal limits.
Both lungs are clear. The visualized skeletal structures are
unremarkable. Surgical staples and scarring right apex.
IMPRESSION: No active disease.

## 2022-01-11 IMAGING — DX DG ABDOMEN 1V
1 series · 1 of 1 positions shown · non-contrast
Comparison: CT abdomen pelvis 01/16/2019

CLINICAL DATA: Post ERCP.  Acute pancreatitis.

EXAM:
ABDOMEN - 1 VIEW

[abdomen kub]
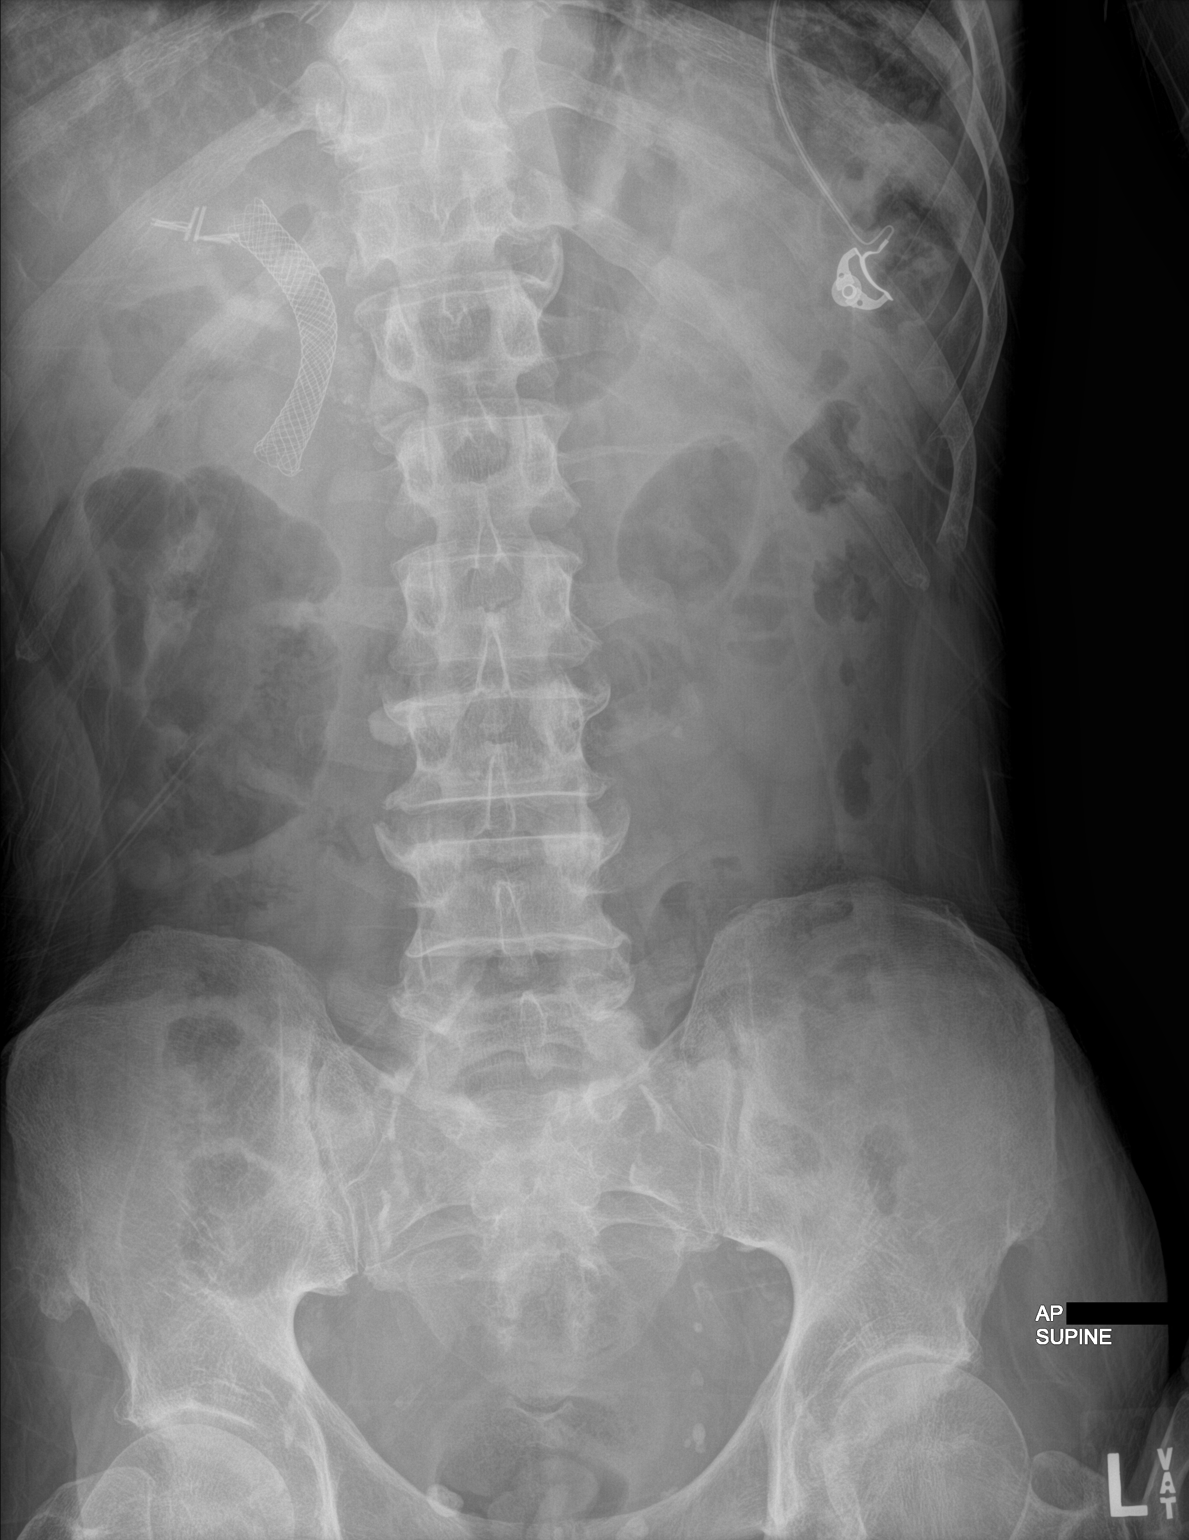

[1 of 1 positions shown; findings below may reference images not displayed]

FINDINGS: Normal bowel gas pattern. Small pancreatic calcifications noted in
the head of the pancreas. Biliary stent noted. Cholecystectomy
clips. Calcifications on either side of the L3 vertebral body appear
to be in the anterior abdominal wall based on prior CT.
IMPRESSION: No acute abnormality. Pancreatic calcifications compatible with
chronic pancreatitis. Biliary stent in good position.

## 2022-01-17 DIAGNOSIS — M272 Inflammatory conditions of jaws: Secondary | ICD-10-CM | POA: Diagnosis not present

## 2022-01-17 DIAGNOSIS — R29898 Other symptoms and signs involving the musculoskeletal system: Secondary | ICD-10-CM | POA: Diagnosis not present

## 2022-02-01 ENCOUNTER — Encounter (HOSPITAL_COMMUNITY): Payer: Self-pay | Admitting: Dentistry

## 2022-02-01 ENCOUNTER — Ambulatory Visit (INDEPENDENT_AMBULATORY_CARE_PROVIDER_SITE_OTHER): Payer: Medicaid Other | Admitting: Dentistry

## 2022-02-01 VITALS — BP 124/80 | HR 66 | Temp 97.8°F

## 2022-02-01 DIAGNOSIS — K056 Periodontal disease, unspecified: Secondary | ICD-10-CM

## 2022-02-01 DIAGNOSIS — C048 Malignant neoplasm of overlapping sites of floor of mouth: Secondary | ICD-10-CM | POA: Diagnosis not present

## 2022-02-01 DIAGNOSIS — K029 Dental caries, unspecified: Secondary | ICD-10-CM

## 2022-02-01 DIAGNOSIS — Y842 Radiological procedure and radiotherapy as the cause of abnormal reaction of the patient, or of later complication, without mention of misadventure at the time of the procedure: Secondary | ICD-10-CM

## 2022-02-01 DIAGNOSIS — Z923 Personal history of irradiation: Secondary | ICD-10-CM

## 2022-02-01 DIAGNOSIS — K036 Deposits [accretions] on teeth: Secondary | ICD-10-CM

## 2022-02-01 NOTE — Progress Notes (Signed)
[facility]     TODAY'S VISIT   4 MONTH RECALL (CLEANING)   PROCEDURES: Adult prophylaxis INDICATIONS: Caries risk:  HIGH Accretions on teeth, periodontal disease, xerostomia Hx of head & neck radiation PLAN: Restorative Continue w/ 4 month cleanings, 6 month POE+updated radiographs Call if any questions or concerns arise before next visit.  Rx:  Sodium fluoride 1.1%  NEXT VISIT:  Restorative per tx plan    Service Date:   02/01/2022  Patient Name:   Tanner Thomas Date of Birth:   03-07-1956 Medical Record Number: [id number]   HISTORY OF PRESENT ILLNESS: Tanner Thomas is a very pleasant 66 y.o. 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  The patient presents today for a cleaning (56-monthrecall visit).   Medical and dental history were reviewed with the patient.  The patient's last recall visit was on 09/30/2021 for a periodic exam, updated radiographs and cleaning.   CHIEF COMPLAINT: Here for a cleaning (458-monthecall);  patient with no complaints.   Patient Active Problem List   Diagnosis Date Noted   Encounter for dental examination and cleaning without abnormal findings 10/10/2021   Teeth missing 10/10/2021   Accretions on teeth 10/10/2021   Xerostomia due to radiotherapy 10/10/2021   Periodontal disease 10/10/2021   Severe protein-calorie malnutrition ([place]04/13/2023   Chronic apical periodontitis 06/08/2021   Loose, teeth 06/08/2021   Radiation caries 05/27/2021   Vitamin D deficiency 05/04/2021   Subclinical hypothyroidism 05/04/2021   Chronic pain of right knee 05/03/2021   History of radiation to head and neck region 03/09/2021   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 ([facility]05/05/2020   Sinus bradycardia    Lung nodule < 6cm on CT 07/05/2019   Iron deficiency anemia 03/05/2019   Past  Medical History:  Diagnosis Date   Acute upper GI bleed 02/05/2020   Calculus of gallbladder without cholecystitis without obstruction    E coli Pyelonephritis 01/16/2019   Head and neck lymphadenopathy 05/26/2020   Tobacco use disorder 07/05/2019   Past Surgical History:  Procedure Laterality Date   BILIARY DILATION  10/30/2019   Procedure: BILIARY DILATION;  Surgeon: JaMilus BanisterMD;  [facility];  Service: Endoscopy;;   BILIARY DILATION  02/05/2020   Procedure: BILIARY DILATION;  Surgeon: JaMilus BanisterMD;  [facility];  Service: Endoscopy;;   BILIARY STENT PLACEMENT N/A 10/30/2019   Procedure: BILIARY STENT PLACEMENT;  Surgeon: JaMilus BanisterMD;  [facility];  Service: Endoscopy;  Laterality: N/A;   BILIARY STENT PLACEMENT N/A 02/05/2020   Procedure: BILIARY STENT PLACEMENT;  Surgeon: JaMilus BanisterMD;  [facility];  Service: Endoscopy;  Laterality: N/A;   BIOPSY  03/06/2019   Procedure: BIOPSY;  Surgeon: RoDaneil DolinMD;  [facility];  Service: Endoscopy;;  gastric   BIOPSY  02/05/2020   Procedure: BIOPSY;  Surgeon: JaMilus BanisterMD;  [facility];  Service: Endoscopy;;  bile buct brushing   BIOPSY  11/14/2021   Procedure: BIOPSY;  Surgeon: MaIrving Copas MD;  [facility];  Service: Gastroenterology;;   CHOLECYSTECTOMY N/A 09/05/2019   Procedure: LAPAROSCOPIC CHOLECYSTECTOMY;  Surgeon: JeAviva SignsMD;  [facility]: [facility];  Service: General;  Laterality: N/A;   COLONOSCOPY WITH PROPOFOL N/A 03/06/2019   Procedure: COLONOSCOPY WITH PROPOFOL;  Surgeon: Daneil Dolin, MD;  [facility];  Service: Endoscopy;  Laterality: N/A;  9:45am - ok at 10:00 per Melanie   ENDOSCOPIC RETROGRADE CHOLANGIOPANCREATOGRAPHY (ERCP) WITH PROPOFOL N/A 10/30/2019   Procedure: ENDOSCOPIC RETROGRADE CHOLANGIOPANCREATOGRAPHY (ERCP) WITH PROPOFOL;  Surgeon: Milus Banister, MD;  [facility];  Service: Endoscopy;  Laterality: N/A;   ENDOSCOPIC RETROGRADE CHOLANGIOPANCREATOGRAPHY (ERCP) WITH PROPOFOL N/A 02/05/2020   Procedure: ENDOSCOPIC RETROGRADE CHOLANGIOPANCREATOGRAPHY (ERCP) WITH PROPOFOL;  Surgeon: Milus Banister, MD;  [facility];  Service: Endoscopy;  Laterality: N/A;   ENDOSCOPIC RETROGRADE CHOLANGIOPANCREATOGRAPHY (ERCP) WITH PROPOFOL N/A 11/14/2021   Procedure: ENDOSCOPIC RETROGRADE CHOLANGIOPANCREATOGRAPHY (ERCP) WITH PROPOFOL;  Surgeon: Rush Landmark Telford Nab., MD;  [facility];  Service: Gastroenterology;  Laterality: N/A;   ERCP N/A 07/14/2021   Procedure: ENDOSCOPIC RETROGRADE CHOLANGIOPANCREATOGRAPHY (ERCP);  Surgeon: Milus Banister, MD;  [facility];  Service: Endoscopy;  Laterality: N/A;   ESOPHAGOGASTRODUODENOSCOPY (EGD) WITH PROPOFOL N/A 03/06/2019   Procedure: ESOPHAGOGASTRODUODENOSCOPY (EGD) WITH PROPOFOL;  Surgeon: Daneil Dolin, MD;  [facility];  Service: Endoscopy;  Laterality: N/A;   ESOPHAGOGASTRODUODENOSCOPY (EGD) WITH PROPOFOL N/A 08/21/2019   Procedure: ESOPHAGOGASTRODUODENOSCOPY (EGD) WITH PROPOFOL;  Surgeon: Milus Banister, MD;  [facility];  Service: Endoscopy;  Laterality: N/A;   EUS N/A 08/21/2019   Procedure: UPPER ENDOSCOPIC ULTRASOUND (EUS) RADIAL;  Surgeon: Milus Banister, MD;  [facility];  Service: Endoscopy;  Laterality: N/A;   PANCREAS SURGERY     REMOVAL OF STONES  10/30/2019   Procedure: REMOVAL OF STONES;  Surgeon: Milus Banister, MD;  [facility];  Service: Endoscopy;;   REMOVAL OF STONES  02/05/2020   Procedure: REMOVAL OF STONES;  Surgeon: Milus Banister, MD;  [facility];  Service: Endoscopy;;   REMOVAL OF STONES  07/14/2021   Procedure: REMOVAL OF SLUDGE;  Surgeon: Milus Banister, MD;  [facility];  Service: Endoscopy;;   REMOVAL OF STONES  11/14/2021   Procedure: REMOVAL OF SLUDGE;  Surgeon: Irving Copas., MD;  [facility];  Service: Gastroenterology;;   [name]  10/30/2019   Procedure: [name];  Surgeon: Milus Banister, MD;  [facility];  Service: Endoscopy;;   SPHINCTEROTOMY  02/05/2020   Procedure: [name];  Surgeon: Milus Banister, MD;  [facility];  Service: Endoscopy;;   SPYGLASS CHOLANGIOSCOPY N/A 02/05/2020   Procedure: [id number] CHOLANGIOSCOPY;  Surgeon: Milus Banister, MD;  [facility];  Service: Endoscopy;  Laterality: N/A;   STENT REMOVAL  02/05/2020   Procedure: STENT REMOVAL;  Surgeon: Milus Banister, MD;  [facility];  Service: Endoscopy;;   STENT REMOVAL  11/14/2021   Procedure: STENT REMOVAL;  Surgeon: Irving Copas., MD;  [facility];  Service: Gastroenterology;;   No Known Allergies Current Outpatient Medications  Medication Sig Dispense Refill   acetaminophen (TYLENOL) 500 MG tablet Take 500 mg by mouth every 6 (six) hours as needed for mild pain.     lidocaine (XYLOCAINE) 2 % solution Patient: Mix 1part 2% viscous lidocaine, 1part H20. Swish & swallow 20m of diluted mixture, 33m before meals and at bedtime, up to QID 300 mL 2   sodium fluoride (SODIUM FLUORIDE 5000 PPM) 1.1 % GEL dental gel Place 1 pea-size drop into each tooth space of fluoride trays once a day at bedtime.  Leave trays in for 5 minutes and then remove.  Spit out excess fluoride, but DO NOT rinse with water,  eat or drink for at least 30 minutes after use. 100 mL 11   VITAMIN E PO Take 1 tablet by mouth in the morning.     No current facility-administered medications for this visit.    LABS: Lab Results  Component Value Date   WBC 4.9 05/03/2021   HGB 13.0 05/03/2021   HCT 38.6 05/03/2021   MCV 95 05/03/2021   PLT 144 (L) 05/03/2021      Component Value Date/Time   NA 140 05/03/2021 1005   K 4.9 05/03/2021 1005   CL 101 05/03/2021 1005   CO2 19 (L) 05/03/2021 1005   GLUCOSE 88 05/03/2021  1005   GLUCOSE 164 (H) 01/17/2021 0804   BUN 6 (L) 05/03/2021 1005   CREATININE 0.75 (L) 05/03/2021 1005   CREATININE 0.81 01/17/2021 0804   CALCIUM 9.0 05/03/2021 1005   GFRNONAA >60 01/17/2021 0804   GFRAA >60 09/03/2019 1459   Lab Results  Component Value Date   INR 1.2 09/03/2019   No results found for: "PTT"  Social History   Socioeconomic History   Marital status: Single    Spouse name: Not on file   Number of children: Not on file   Years of education: Not on file   Highest education level: Not on file  Occupational History   Not on file  Tobacco Use   Smoking status: Former    Packs/day: 0.00    Years: 50.00    Total pack years: 0.00    Types: Cigarettes    Start date: 05/24/1967    Quit date: 06/2020    Years since quitting: 1.6   Smokeless tobacco: Never  Vaping Use   Vaping Use: Never used  Substance and Sexual Activity   Alcohol use: Not Currently   Drug use: Not Currently    Types: Marijuana   Sexual activity: Yes  Other Topics Concern   Not on file  Social History Narrative   Not on file   Social Determinants of Health   Financial Resource Strain: High Risk (07/29/2020)   Overall Financial Resource Strain (CARDIA)    Difficulty of Paying Living Expenses: Very hard  Food Insecurity: No Food Insecurity (04/05/2021)   Hunger Vital Sign    Worried About Running Out of Food in the Last Year: Never true    Ran Out of Food in the Last Year: Never true  Transportation Needs: No Transportation Needs (05/05/2021)   PRAPARE - Hydrologist (Medical): No    Lack of Transportation (Non-Medical): No  Physical Activity: Not on file  Stress: Not on file  Social Connections: Moderately Integrated (05/05/2021)   Social Connection and Isolation Panel [NHANES]    Frequency of Communication with Friends and Family: More than three times a week    Frequency of Social Gatherings with Friends and Family: More than three times a week     Attends Religious Services: More than 4 times per year    Active Member of [employer] or Organizations: Yes    Attends Music therapist: More than 4 times per year    Marital Status: Never married  Human resources officer Violence: Not on file   Family History  Problem Relation Age of Onset   Heart disease Father    Colon cancer Neg Hx    Gastric cancer Neg Hx    Esophageal cancer Neg Hx      REVIEW OF SYSTEMS:  Reviewed with the patient as per HPI. PSYCH:  Patient denies having dental phobia.   VITAL SIGNS: BP 124/80 (BP Location: Right Arm, Patient Position: Sitting, Cuff Size: Normal)   Pulse 66   Temp 97.8 F (36.6 C) (Oral)    ASSESSMENT:  60-monthdental cleaning Accretions on teeth Periodontal disease Caries Xerostomia due to radiotherapy Patient with history of radiation to the head and neck region   ANTIBIOTIC PROPHYLAXIS/OTHER PREMEDICATION: [ N/A ]   PROCEDURES: Adult prophylaxis. Removed all supra- and subgingival calculus and plaque using Cavitron and hand instruments. Polished all teeth.  Flossed. Oral hygiene instruction discussed with the patient. Toothbrush, toothpaste and floss given to the patient.   PLAN AND RECOMMENDATIONS: Restorative as treatment planned.  Continue with 4 month recall interval for cleanings/periodontal therapy and 6 month recall interval for periodic exams and updated radiographs. Call if questions or concerns arise before next visit.  Rx:  Sodium fluoride 1.1% gel   NEXT VISIT:  Restorative  All questions and concerns were invited and addressed.  The patient tolerated today's visit well and departed in stable condition.  -MSandi Mariscal DMD

## 2022-02-07 ENCOUNTER — Ambulatory Visit (INDEPENDENT_AMBULATORY_CARE_PROVIDER_SITE_OTHER): Payer: Medicaid Other | Admitting: Internal Medicine

## 2022-02-07 ENCOUNTER — Encounter: Payer: Self-pay | Admitting: Internal Medicine

## 2022-02-07 VITALS — BP 118/80 | HR 58 | Resp 18 | Ht 69.0 in | Wt 123.6 lb

## 2022-02-07 DIAGNOSIS — E43 Unspecified severe protein-calorie malnutrition: Secondary | ICD-10-CM | POA: Diagnosis not present

## 2022-02-07 DIAGNOSIS — Z125 Encounter for screening for malignant neoplasm of prostate: Secondary | ICD-10-CM

## 2022-02-07 DIAGNOSIS — Z0001 Encounter for general adult medical examination with abnormal findings: Secondary | ICD-10-CM | POA: Diagnosis not present

## 2022-02-07 DIAGNOSIS — Z23 Encounter for immunization: Secondary | ICD-10-CM

## 2022-02-07 DIAGNOSIS — K056 Periodontal disease, unspecified: Secondary | ICD-10-CM | POA: Insufficient documentation

## 2022-02-07 DIAGNOSIS — E038 Other specified hypothyroidism: Secondary | ICD-10-CM | POA: Diagnosis not present

## 2022-02-07 DIAGNOSIS — K029 Dental caries, unspecified: Secondary | ICD-10-CM | POA: Insufficient documentation

## 2022-02-07 MED ORDER — ENSURE ENLIVE PO LIQD: 1.0000 | ORAL | 3 refills | Status: DC

## 2022-02-07 NOTE — Assessment & Plan Note (Signed)
Lab Results  Component Value Date   TSH 3.820 11/04/2021   Free T4 WNL Currently patient is asymptomatic TSH and free T4, thyroid peroxidase WNL

## 2022-02-07 NOTE — Assessment & Plan Note (Signed)
Severe weight loss, likely due to poor dentition Now has dentures placed, advised to eat as tolerated Advised to take protein supplements

## 2022-02-07 NOTE — Progress Notes (Signed)
Established Patient Office Visit  Subjective:  Patient ID: Tanner Thomas, male    DOB: [birth year]  Age: 66 y.o. MRN: [id number]  CC:  Chief Complaint  Patient presents with   Annual Exam    Annual exam     HPI [name] is a 66 y.o. 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  His TSH was elevated at 8.15 in 04/23, but was WNL in 07/23. He had had elevated TSH in the past well.  He denies any recent change in appetite, fatigue or constipation.  He has been taking protein supplements to improve his weight.  He still does not have lower dentures, currently followed by dental surgeon.     Past Medical History:  Diagnosis Date   Acute upper GI bleed 02/05/2020   Calculus of gallbladder without cholecystitis without obstruction    E coli Pyelonephritis 01/16/2019   Head and neck lymphadenopathy 05/26/2020   Tobacco use disorder 07/05/2019    Past Surgical History:  Procedure Laterality Date   BILIARY DILATION  10/30/2019   Procedure: BILIARY DILATION;  Surgeon: Milus Banister, MD;  Location: [facility];  Service: Endoscopy;;   BILIARY DILATION  02/05/2020   Procedure: BILIARY DILATION;  Surgeon: Milus Banister, MD;  Location: [facility];  Service: Endoscopy;;   BILIARY STENT PLACEMENT N/A 10/30/2019   Procedure: BILIARY STENT PLACEMENT;  Surgeon: Milus Banister, MD;  Location: [facility];  Service: Endoscopy;  Laterality: N/A;   BILIARY STENT PLACEMENT N/A 02/05/2020   Procedure: BILIARY STENT PLACEMENT;  Surgeon: Milus Banister, MD;  Location: [facility];  Service: Endoscopy;  Laterality: N/A;   BIOPSY  03/06/2019   Procedure: BIOPSY;  Surgeon: Daneil Dolin, MD;  [facility];  Service: Endoscopy;;  gastric   BIOPSY  02/05/2020   Procedure: BIOPSY;  Surgeon: Milus Banister, MD;  Location: [facility];  Service: Endoscopy;;  bile  buct brushing   BIOPSY  11/14/2021   Procedure: BIOPSY;  Surgeon: Irving Copas., MD;  Location: [facility];  Service: Gastroenterology;;   CHOLECYSTECTOMY N/A 09/05/2019   Procedure: LAPAROSCOPIC CHOLECYSTECTOMY;  Surgeon: Aviva Signs, MD;  Location: [facility];  Service: General;  Laterality: N/A;   COLONOSCOPY WITH PROPOFOL N/A 03/06/2019   Procedure: COLONOSCOPY WITH PROPOFOL;  Surgeon: Daneil Dolin, MD;  [facility];  Service: Endoscopy;  Laterality: N/A;  9:45am - ok at 10:00 per Melanie   ENDOSCOPIC RETROGRADE CHOLANGIOPANCREATOGRAPHY (ERCP) WITH PROPOFOL N/A 10/30/2019   Procedure: ENDOSCOPIC RETROGRADE CHOLANGIOPANCREATOGRAPHY (ERCP) WITH PROPOFOL;  Surgeon: Milus Banister, MD;  [facility];  Service: Endoscopy;  Laterality: N/A;   ENDOSCOPIC RETROGRADE CHOLANGIOPANCREATOGRAPHY (ERCP) WITH PROPOFOL N/A 02/05/2020   Procedure: ENDOSCOPIC RETROGRADE CHOLANGIOPANCREATOGRAPHY (ERCP) WITH PROPOFOL;  Surgeon: Milus Banister, MD;  [facility]: [facility];  Service: Endoscopy;  Laterality: N/A;   ENDOSCOPIC RETROGRADE CHOLANGIOPANCREATOGRAPHY (ERCP) WITH PROPOFOL N/A 11/14/2021   Procedure: ENDOSCOPIC RETROGRADE CHOLANGIOPANCREATOGRAPHY (ERCP) WITH PROPOFOL;  Surgeon: Rush Landmark Telford Nab., MD;  Location: [facility];  Service: Gastroenterology;  Laterality: N/A;   ERCP N/A 07/14/2021   Procedure: ENDOSCOPIC RETROGRADE CHOLANGIOPANCREATOGRAPHY (ERCP);  Surgeon: Milus Banister, MD;  Location: [facility];  Service: Endoscopy;  Laterality: N/A;   ESOPHAGOGASTRODUODENOSCOPY (EGD) WITH PROPOFOL N/A 03/06/2019   Procedure: ESOPHAGOGASTRODUODENOSCOPY (EGD) WITH PROPOFOL;  Surgeon: Daneil Dolin, MD;  [facility];  Service: Endoscopy;  Laterality: N/A;   ESOPHAGOGASTRODUODENOSCOPY (EGD) WITH PROPOFOL N/A 08/21/2019   Procedure: ESOPHAGOGASTRODUODENOSCOPY (EGD) WITH PROPOFOL;  Surgeon: Milus Banister, MD;  Location: [facility];  Service: Endoscopy;   Laterality: N/A;   EUS N/A 08/21/2019   Procedure: UPPER ENDOSCOPIC ULTRASOUND (EUS) RADIAL;  Surgeon: Milus Banister, MD;  Location: [facility];  Service: Endoscopy;  Laterality: N/A;   PANCREAS SURGERY     REMOVAL OF STONES  10/30/2019   Procedure: REMOVAL OF STONES;  Surgeon: Milus Banister, MD;  Location: [facility];  Service: Endoscopy;;   REMOVAL OF STONES  02/05/2020   Procedure: REMOVAL OF STONES;  Surgeon: Milus Banister, MD;  Location: [facility];  Service: Endoscopy;;   REMOVAL OF STONES  07/14/2021   Procedure: REMOVAL OF SLUDGE;  Surgeon: Milus Banister, MD;  Location: [facility];  Service: Endoscopy;;   REMOVAL OF STONES  11/14/2021   Procedure: REMOVAL OF SLUDGE;  Surgeon: Irving Copas., MD;  Location: [facility];  Service: Gastroenterology;;   [name]  10/30/2019   Procedure: [name];  Surgeon: Milus Banister, MD;  Location: [facility];  Service: Endoscopy;;   SPHINCTEROTOMY  02/05/2020   Procedure: [name];  Surgeon: Milus Banister, MD;  Location: [facility];  Service: Endoscopy;;   SPYGLASS CHOLANGIOSCOPY N/A 02/05/2020   Procedure: LFYBOFBP CHOLANGIOSCOPY;  Surgeon: Milus Banister, MD;  Location: [facility];  Service: Endoscopy;  Laterality: N/A;   STENT REMOVAL  02/05/2020   Procedure: STENT REMOVAL;  Surgeon: Milus Banister, MD;  Location: [facility];  Service: Endoscopy;;   STENT REMOVAL  11/14/2021   Procedure: Lavell Islam REMOVAL;  Surgeon: Irving Copas., MD;  Location: [facility];  Service: Gastroenterology;;    Family History  Problem Relation Age of Onset   Heart disease Father    Colon cancer Neg Hx    Gastric cancer Neg Hx    Esophageal cancer Neg Hx     Social History   Socioeconomic History   Marital status: Single    Spouse name: Not on file   Number of children: Not on file   Years of education: Not on file   Highest education level: Not on file  Occupational History   Not on file   Tobacco Use   Smoking status: Former    Packs/day: 0.00    Years: 50.00    Total pack years: 0.00    Types: Cigarettes    Start date: 05/24/1967    Quit date: 06/2020    Years since quitting: 1.6   Smokeless tobacco: Never  Vaping Use   Vaping Use: Never used  Substance and Sexual Activity   Alcohol use: Not Currently   Drug use: Not Currently    Types: Marijuana   Sexual activity: Yes  Other Topics Concern   Not on file  Social History Narrative   Not on file   Social Determinants of Health   Financial Resource Strain: High Risk (07/29/2020)   Overall Financial Resource Strain (CARDIA)    Difficulty of Paying Living Expenses: Very hard  Food Insecurity: No Food Insecurity (04/05/2021)   Hunger Vital Sign    Worried About Running Out of Food in the Last Year: Never true    Ran Out of Food in the Last Year: Never true  Transportation Needs: No Transportation Needs (05/05/2021)   PRAPARE - Hydrologist (Medical): No    Lack of Transportation (Non-Medical): No  Physical Activity: Not on file  Stress:  Not on file  Social Connections: Moderately Integrated (05/05/2021)   Social Connection and Isolation Panel [NHANES]    Frequency of Communication with Friends and Family: More than three times a week    Frequency of Social Gatherings with Friends and Family: More than three times a week    Attends Religious Services: More than 4 times per year    Active Member of [employer] or Organizations: Yes    Attends Music therapist: More than 4 times per year    Marital Status: Never married  Human resources officer Violence: Not on file    Outpatient Medications Prior to Visit  Medication Sig Dispense Refill   acetaminophen (TYLENOL) 500 MG tablet Take 500 mg by mouth every 6 (six) hours as needed for mild pain.     lidocaine (XYLOCAINE) 2 % solution Patient: Mix 1part 2% viscous lidocaine, 1part H20. Swish & swallow 82m of diluted mixture, 389m before  meals and at bedtime, up to QID 300 mL 2   sodium fluoride (SODIUM FLUORIDE 5000 PPM) 1.1 % GEL dental gel Place 1 pea-size drop into each tooth space of fluoride trays once a day at bedtime.  Leave trays in for 5 minutes and then remove.  Spit out excess fluoride, but DO NOT rinse with water, eat or drink for at least 30 minutes after use. 100 mL 11   VITAMIN E PO Take 1 tablet by mouth in the morning.     No facility-administered medications prior to visit.    No Known Allergies  ROS Review of Systems  Constitutional:  Negative for chills and fever.  HENT:  Negative for congestion and sore throat.   Eyes:  Negative for pain and discharge.  Respiratory:  Negative for cough and shortness of breath.   Cardiovascular:  Negative for chest pain and palpitations.  Gastrointestinal:  Negative for constipation, diarrhea, nausea and vomiting.  Endocrine: Negative for polydipsia and polyuria.  Genitourinary:  Negative for dysuria and hematuria.  Musculoskeletal:  Positive for arthralgias. Negative for neck pain and neck stiffness.  Skin:  Negative for rash.  Neurological:  Negative for dizziness, weakness, numbness and headaches.  Psychiatric/Behavioral:  Negative for agitation and behavioral problems.       Objective:    Physical Exam Vitals reviewed.  Constitutional:      General: He is not in acute distress.    Appearance: He is cachectic. He is not diaphoretic.     Comments: Muscle wasting  HENT:     Head: Normocephalic and atraumatic.     Nose: Nose normal.     Mouth/Throat:     Mouth: Mucous membranes are moist.     Comments: S/p removal of lower teeth Eyes:     General: No scleral icterus.    Extraocular Movements: Extraocular movements intact.  Neck:     Comments: Neck incision site - C/D/I Cardiovascular:     Rate and Rhythm: Normal rate and regular rhythm.     Pulses: Normal pulses.     Heart sounds: Normal heart sounds. No murmur heard. Pulmonary:     Breath  sounds: Normal breath sounds. No wheezing or rales.  Abdominal:     Palpations: Abdomen is soft.     Tenderness: There is no abdominal tenderness.  Musculoskeletal:     Cervical back: Neck supple. No tenderness.     Right lower leg: No edema.     Left lower leg: No edema.  Skin:    General: Skin is warm.  Findings: No rash.  Neurological:     General: No focal deficit present.     Mental Status: He is alert and oriented to person, place, and time.     Sensory: No sensory deficit.     Motor: No weakness.  Psychiatric:        Mood and Affect: Mood normal.        Behavior: Behavior normal.     BP 118/80 (BP Location: Right Arm, Patient Position: Sitting, Cuff Size: Normal)   Pulse (!) 58   Resp 18   Ht '5\' 9"'  (1.753 m)   Wt 123 lb 9.6 oz (56.1 kg)   SpO2 100%   BMI 18.25 kg/m  Wt Readings from Last 3 Encounters:  02/07/22 123 lb 9.6 oz (56.1 kg)  11/14/21 120 lb (54.4 kg)  11/04/21 119 lb 3.2 oz (54.1 kg)    Lab Results  Component Value Date   TSH 3.820 11/04/2021   Lab Results  Component Value Date   WBC 4.9 05/03/2021   HGB 13.0 05/03/2021   HCT 38.6 05/03/2021   MCV 95 05/03/2021   PLT 144 (L) 05/03/2021   Lab Results  Component Value Date   NA 140 05/03/2021   K 4.9 05/03/2021   CO2 19 (L) 05/03/2021   GLUCOSE 88 05/03/2021   BUN 6 (L) 05/03/2021   CREATININE 0.75 (L) 05/03/2021   BILITOT 0.4 05/03/2021   ALKPHOS 83 05/03/2021   AST 65 (H) 05/03/2021   ALT 23 05/03/2021   PROT 7.6 05/03/2021   ALBUMIN 4.4 05/03/2021   CALCIUM 9.0 05/03/2021   ANIONGAP 12 01/17/2021   EGFR 101 05/03/2021   Lab Results  Component Value Date   CHOL 138 03/11/2019   Lab Results  Component Value Date   HDL 44 03/11/2019   Lab Results  Component Value Date   LDLCALC 76 03/11/2019   Lab Results  Component Value Date   TRIG 88 03/11/2019   Lab Results  Component Value Date   CHOLHDL 3.1 03/11/2019   Lab Results  Component Value Date   HGBA1C 5.8 (H)  03/11/2019      Assessment & Plan:   Problem List Items Addressed This Visit       Endocrine   Subclinical hypothyroidism    Lab Results  Component Value Date   TSH 3.820 11/04/2021  Free T4 WNL Currently patient is asymptomatic TSH and free T4, thyroid peroxidase WNL      Relevant Orders   TSH     Other   Severe protein-calorie malnutrition (HCC)    Severe weight loss, likely due to poor dentition Now has dentures placed, advised to eat as tolerated Advised to take protein supplements      Relevant Orders   VITAMIN D 25 Hydroxy (Vit-D Deficiency, Fractures)   TSH   CMP14+EGFR   Encounter for general adult medical examination with abnormal findings - Primary    Physical exam as documented. Fasting blood tests ordered. Flu vaccine today. Prefers to wait for Shingrix vaccine.      Relevant Orders   Hemoglobin A1c   CMP14+EGFR   CBC with Differential/Platelet   Lipid Profile   Prostate cancer screening    Ordered PSA after discussing its limitations for prostate cancer screening, including false positive results leading additional investigations.      Relevant Orders   PSA   Other Visit Diagnoses     Need for immunization against influenza       Relevant Orders  Flu Vaccine QUAD 41moIM (Fluarix, Fluzone & Alfiuria Quad PF) (Completed)       Meds ordered this encounter  Medications   feeding supplement (ENSURE ENLIVE / ENSURE PLUS) LIQD    Sig: Take 237 mLs by mouth daily.    Dispense:  7110 mL    Refill:  3    Follow-up: Return in about 4 months (around 06/10/2022).    RLindell Spar MD

## 2022-02-07 NOTE — Patient Instructions (Signed)
Please continue taking protein supplements.  Please continue to eat at regular intervals and take at least 64 ounces of fluid in a day.  Please get fasting blood tests done before the next visit.

## 2022-02-07 NOTE — Assessment & Plan Note (Signed)
Physical exam as documented. Fasting blood tests ordered. Flu vaccine today. Prefers to wait for Shingrix vaccine.

## 2022-02-07 NOTE — Assessment & Plan Note (Signed)
Ordered PSA after discussing its limitations for prostate cancer screening, including false positive results leading additional investigations. 

## 2022-02-14 ENCOUNTER — Other Ambulatory Visit: Payer: Self-pay | Admitting: *Deleted

## 2022-02-14 ENCOUNTER — Telehealth: Payer: Self-pay | Admitting: Internal Medicine

## 2022-02-14 MED ORDER — BOOST PO LIQD: 237.0000 mL | Freq: Two times a day (BID) | ORAL | 10 refills | Status: AC

## 2022-02-14 NOTE — Telephone Encounter (Signed)
[facility] called in on patient behalf.  Received orders for Ensure , but it is currently on back order.  Wants to see if Boost very high calorie can be provided instead.  Ordered for 1x daily but lowest they can send in is 2x daily   Also needs clinical notes faxed over with order [phone])   Call back info [phone] ext [phone]

## 2022-02-14 NOTE — Telephone Encounter (Signed)
Printed and faxed to [facility]

## 2022-02-15 NOTE — Therapy (Signed)
[facility] [address] [place], [place], [address] Phone: [phone]   Fax:  [phone]  Patient Details  Name: Tanner Thomas MRN: [id number] Date of Birth: 04-Nov-1955 Referring Provider:  Eppie Gibson, MD  Encounter Date: 02/15/2022  SPEECH THERAPY DISCHARGE SUMMARY  Visits from Start of Care: 6  Current functional level related to goals / functional outcomes: Pt no-showed his last visit in February and did not return to [place].   Remaining deficits: Assumed, identical deficits to last session on 03-30-22.   Education / Equipment: HEP procedure, late effects head/neck radiation on swallowing abilities, s/sx aspiration PNA, how to modify HEP PRN.   Patient agrees to discharge. Patient goals were partially met. Patient is being discharged due to not returning since the last visit.Marland Kitchen     [employer], [place] 02/15/2022, 9:31 AM  [facility] [address], [place], [place], [address] Phone: [phone]   Fax:  [phone]

## 2022-02-23 ENCOUNTER — Other Ambulatory Visit: Payer: Self-pay | Admitting: *Deleted

## 2022-02-23 ENCOUNTER — Telehealth: Payer: Self-pay | Admitting: Internal Medicine

## 2022-02-23 MED ORDER — ENSURE ENLIVE PO LIQD: 1.0000 | ORAL | 3 refills | Status: AC

## 2022-02-23 NOTE — Telephone Encounter (Signed)
Patient called in regard to lactose free nutrition (BOOST) LIQD   [facility] has not received script the Boost   Patient needs sent in to [facility] for delivery.

## 2022-02-23 NOTE — Telephone Encounter (Signed)
Refaxed to [facility]

## 2022-03-08 ENCOUNTER — Ambulatory Visit (INDEPENDENT_AMBULATORY_CARE_PROVIDER_SITE_OTHER): Payer: Medicaid Other | Admitting: Dentistry

## 2022-03-08 ENCOUNTER — Encounter (HOSPITAL_COMMUNITY): Payer: Self-pay | Admitting: Dentistry

## 2022-03-08 VITALS — BP 89/63 | HR 72 | Temp 98.6°F

## 2022-03-08 DIAGNOSIS — K029 Dental caries, unspecified: Secondary | ICD-10-CM

## 2022-03-08 MED ORDER — SODIUM FLUORIDE 1.1 % DT PSTE: PASTE | DENTAL | 6 refills | Status: AC

## 2022-03-08 NOTE — Progress Notes (Signed)
[facility]      TODAY'S VISIT:    RESTORATIVE   DIAGNOSIS: Teeth #'s 5 & 6 caries PROCEDURES: Restorative on #5 & #6 PLAN: Continue with restorative per treatment plan.  NEXT VISIT:  Restorative per treatment plan    Service Date:   03/08/2022  Patient Name:   Tanner Thomas Date of Birth:   [birth year] Medical Record Number: [id number]   HISTORY OF PRESENT ILLNESS: Tanner Thomas is a 66 y.o. male who presents today for restorative treatment on teeth numbers 5 and 6 per treatment plan. Medical and dental history reviewed with the patient.  No changes were reported.   CHIEF COMPLAINT:  Here for a routine dental appointment; patient with no complaints.   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   Loose, teeth 06/08/2021   Radiation caries 05/27/2021   Vitamin D deficiency 05/04/2021   Subclinical hypothyroidism 05/04/2021   Chronic pain of right knee 05/03/2021   History of radiation to head and neck region 03/09/2021   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   Lung nodule < 6cm on CT 07/05/2019   Iron deficiency anemia 03/05/2019   Past Medical History:  Diagnosis Date   Acute upper GI bleed 02/05/2020   Calculus of gallbladder without cholecystitis without obstruction    E coli Pyelonephritis 01/16/2019   Head and neck lymphadenopathy 05/26/2020   Tobacco use disorder 07/05/2019   Past Surgical History:  Procedure Laterality Date   BILIARY DILATION  10/30/2019   Procedure: BILIARY DILATION;  Surgeon:  Milus Banister, MD;  Location: [facility];  Service: Endoscopy;;   BILIARY DILATION  02/05/2020   Procedure: BILIARY DILATION;  Surgeon: Milus Banister, MD;  Location: [facility];  Service: Endoscopy;;   BILIARY STENT PLACEMENT N/A 10/30/2019   Procedure: BILIARY STENT PLACEMENT;  Surgeon: Milus Banister, MD;  Location: [facility];  Service: Endoscopy;  Laterality: N/A;   BILIARY STENT PLACEMENT N/A 02/05/2020   Procedure: BILIARY STENT PLACEMENT;  Surgeon: Milus Banister, MD;  Location: [facility];  Service: Endoscopy;  Laterality: N/A;   BIOPSY  03/06/2019   Procedure: BIOPSY;  Surgeon: Daneil Dolin, MD;  [facility];  Service: Endoscopy;;  gastric   BIOPSY  02/05/2020   Procedure: BIOPSY;  Surgeon: Milus Banister, MD;  Location: [facility];  Service: Endoscopy;;  bile buct brushing   BIOPSY  11/14/2021   Procedure: BIOPSY;  Surgeon: Irving Copas., MD;  Location: [facility];  Service: Gastroenterology;;   CHOLECYSTECTOMY N/A 09/05/2019   Procedure: LAPAROSCOPIC CHOLECYSTECTOMY;  Surgeon: Aviva Signs, MD;  Location: [facility];  Service: General;  Laterality: N/A;   COLONOSCOPY WITH PROPOFOL N/A 03/06/2019   Procedure: COLONOSCOPY WITH PROPOFOL;  Surgeon: Daneil Dolin, MD;  [facility];  Service: Endoscopy;  Laterality: N/A;  9:45am - ok at 10:00 per Melanie   ENDOSCOPIC RETROGRADE CHOLANGIOPANCREATOGRAPHY (ERCP) WITH PROPOFOL N/A 10/30/2019   Procedure: ENDOSCOPIC RETROGRADE CHOLANGIOPANCREATOGRAPHY (ERCP) WITH PROPOFOL;  Surgeon: Milus Banister, MD;  Location: [facility];  Service: Endoscopy;  Laterality: N/A;  ENDOSCOPIC RETROGRADE CHOLANGIOPANCREATOGRAPHY (ERCP) WITH PROPOFOL N/A 02/05/2020   Procedure: ENDOSCOPIC RETROGRADE CHOLANGIOPANCREATOGRAPHY (ERCP) WITH PROPOFOL;  Surgeon: Milus Banister, MD;  Location: [facility];  Service: Endoscopy;  Laterality: N/A;   ENDOSCOPIC RETROGRADE CHOLANGIOPANCREATOGRAPHY (ERCP) WITH  PROPOFOL N/A 11/14/2021   Procedure: ENDOSCOPIC RETROGRADE CHOLANGIOPANCREATOGRAPHY (ERCP) WITH PROPOFOL;  Surgeon: Rush Landmark Telford Nab., MD;  Location: [facility];  Service: Gastroenterology;  Laterality: N/A;   ERCP N/A 07/14/2021   Procedure: ENDOSCOPIC RETROGRADE CHOLANGIOPANCREATOGRAPHY (ERCP);  Surgeon: Milus Banister, MD;  Location: [facility];  Service: Endoscopy;  Laterality: N/A;   ESOPHAGOGASTRODUODENOSCOPY (EGD) WITH PROPOFOL N/A 03/06/2019   Procedure: ESOPHAGOGASTRODUODENOSCOPY (EGD) WITH PROPOFOL;  Surgeon: Daneil Dolin, MD;  [facility];  Service: Endoscopy;  Laterality: N/A;   ESOPHAGOGASTRODUODENOSCOPY (EGD) WITH PROPOFOL N/A 08/21/2019   Procedure: ESOPHAGOGASTRODUODENOSCOPY (EGD) WITH PROPOFOL;  Surgeon: Milus Banister, MD;  Location: [facility];  Service: Endoscopy;  Laterality: N/A;   EUS N/A 08/21/2019   Procedure: UPPER ENDOSCOPIC ULTRASOUND (EUS) RADIAL;  Surgeon: Milus Banister, MD;  Location: [facility];  Service: Endoscopy;  Laterality: N/A;   PANCREAS SURGERY     REMOVAL OF STONES  10/30/2019   Procedure: REMOVAL OF STONES;  Surgeon: Milus Banister, MD;  Location: [facility];  Service: Endoscopy;;   REMOVAL OF STONES  02/05/2020   Procedure: REMOVAL OF STONES;  Surgeon: Milus Banister, MD;  Location: [facility];  Service: Endoscopy;;   REMOVAL OF STONES  07/14/2021   Procedure: REMOVAL OF SLUDGE;  Surgeon: Milus Banister, MD;  Location: [facility];  Service: Endoscopy;;   REMOVAL OF STONES  11/14/2021   Procedure: REMOVAL OF SLUDGE;  Surgeon: Irving Copas., MD;  Location: [facility];  Service: Gastroenterology;;   [name]  10/30/2019   Procedure: [name];  Surgeon: Milus Banister, MD;  Location: [facility];  Service: Endoscopy;;   SPHINCTEROTOMY  02/05/2020   Procedure: [name];  Surgeon: Milus Banister, MD;  Location: [facility];  Service: Endoscopy;;   SPYGLASS CHOLANGIOSCOPY N/A  02/05/2020   Procedure: EZMOQHUT CHOLANGIOSCOPY;  Surgeon: Milus Banister, MD;  Location: [facility];  Service: Endoscopy;  Laterality: N/A;   STENT REMOVAL  02/05/2020   Procedure: STENT REMOVAL;  Surgeon: Milus Banister, MD;  Location: [facility];  Service: Endoscopy;;   STENT REMOVAL  11/14/2021   Procedure: Lavell Islam REMOVAL;  Surgeon: Irving Copas., MD;  Location: [facility];  Service: Gastroenterology;;   No Known Allergies Current Outpatient Medications  Medication Sig Dispense Refill   lactose free nutrition (BOOST) LIQD Take 237 mLs by mouth 2 (two) times daily between meals. 237 mL 10   acetaminophen (TYLENOL) 500 MG tablet Take 500 mg by mouth every 6 (six) hours as needed for mild pain.     feeding supplement (ENSURE ENLIVE / ENSURE PLUS) LIQD Take 237 mLs by mouth daily. 7110 mL 3   lidocaine (XYLOCAINE) 2 % solution Patient: Mix 1part 2% viscous lidocaine, 1part H20. Swish & swallow 58m of diluted mixture, 363m before meals and at bedtime, up to QID 300 mL 2   sodium fluoride (SODIUM FLUORIDE 5000 PPM) 1.1 % GEL dental gel Place 1 pea-size drop into each tooth space of fluoride trays once a day at bedtime.  Leave trays in for 5 minutes and then remove.  Spit out excess fluoride, but DO NOT rinse with water, eat or drink for at least 30 minutes after use. 100 mL 11   VITAMIN E PO Take 1 tablet  by mouth in the morning.     No current facility-administered medications for this visit.    LABS: Lab Results  Component Value Date   WBC 4.9 05/03/2021   HGB 13.0 05/03/2021   HCT 38.6 05/03/2021   MCV 95 05/03/2021   PLT 144 (L) 05/03/2021      Component Value Date/Time   NA 140 05/03/2021 1005   K 4.9 05/03/2021 1005   CL 101 05/03/2021 1005   CO2 19 (L) 05/03/2021 1005   GLUCOSE 88 05/03/2021 1005   GLUCOSE 164 (H) 01/17/2021 0804   BUN 6 (L) 05/03/2021 1005   CREATININE 0.75 (L) 05/03/2021 1005   CREATININE 0.81 01/17/2021 0804   CALCIUM 9.0 05/03/2021  1005   GFRNONAA >60 01/17/2021 0804   GFRAA >60 09/03/2019 1459   Lab Results  Component Value Date   INR 1.2 09/03/2019   No results found for: "PTT"  Social History   Socioeconomic History   Marital status: Single    Spouse name: Not on file   Number of children: Not on file   Years of education: Not on file   Highest education level: Not on file  Occupational History   Not on file  Tobacco Use   Smoking status: Former    Packs/day: 0.00    Years: 50.00    Total pack years: 0.00    Types: Cigarettes    Start date: 05/24/1967    Quit date: 06/2020    Years since quitting: 1.7   Smokeless tobacco: Never  Vaping Use   Vaping Use: Never used  Substance and Sexual Activity   Alcohol use: Not Currently   Drug use: Not Currently    Types: Marijuana   Sexual activity: Yes  Other Topics Concern   Not on file  Social History Narrative   Not on file   Social Determinants of Health   Financial Resource Strain: High Risk (07/29/2020)   Overall Financial Resource Strain (CARDIA)    Difficulty of Paying Living Expenses: Very hard  Food Insecurity: No Food Insecurity (04/05/2021)   Hunger Vital Sign    Worried About Running Out of Food in the Last Year: Never true    Ran Out of Food in the Last Year: Never true  Transportation Needs: No Transportation Needs (05/05/2021)   PRAPARE - Hydrologist (Medical): No    Lack of Transportation (Non-Medical): No  Physical Activity: Not on file  Stress: Not on file  Social Connections: Moderately Integrated (05/05/2021)   Social Connection and Isolation Panel [NHANES]    Frequency of Communication with Friends and Family: More than three times a week    Frequency of Social Gatherings with Friends and Family: More than three times a week    Attends Religious Services: More than 4 times per year    Active Member of [employer] or Organizations: Yes    Attends Music therapist: More than 4 times per  year    Marital Status: Never married  Human resources officer Violence: Not on file   Family History  Problem Relation Age of Onset   Heart disease Father    Colon cancer Neg Hx    Gastric cancer Neg Hx    Esophageal cancer Neg Hx     ANTIBIOTIC PROPHYLAXIS/OTHER PREMEDICATION: [N/A]   VITAL SIGNS: BP (!) 89/63 (BP Location: Right Arm, Patient Position: Sitting, Cuff Size: Normal)   Pulse 72   Temp 98.6 F (37 C) (Oral)  ASSESSMENT/INDICATION(S): Dental caries   PROCEDURES: Restorative treatment on teeth #5 B(V) and #6 DLI. ANESTHESIA: Topical:  Benzocaine 20% applied Type of anesthesia used:  34 mg lidocaine, 0.018 mg epinephrine Location given:  #5 and #6 infiltration Aspiration negative. COMPOSITE RESTORATIONS: Cotton roll isolation. Excavated decay from teeth numbers 5 and 6. Teeth number 5 B(V) and 6 DLI were prepared for composite. The extent of caries was into dentin. Etched enamel and dentin surfaces  with 32% phosphoric acid for 15 seconds and rinsed thoroughly. Removed excess water with a brief burst of air. Optibond bonding agent placed and air dried until no movement of bonding agent was seen and then light cured for 10 seconds. OMNICHROMA flowable composite material placed in increments and light-cured. Removed excess material with carbide finishing bur. Margins and contacts were verified and adjusted as needed.  Restorations finished and polished. The patient was advised of possible normal sensitivity to hot and cold for the next few days/weeks.  PLAN:   NEXT VISIT:  Restorative per treatment plan  All questions and concerns were invited and addressed.  The patient tolerated today's visit well and departed in stable condition.  -Sandi Mariscal, DMD

## 2022-04-06 DIAGNOSIS — E43 Unspecified severe protein-calorie malnutrition: Secondary | ICD-10-CM | POA: Diagnosis not present

## 2022-04-11 IMAGING — CT CT NECK W/ CM
2 of 3 series · 7 of 14 positions shown, 8 images · IV contrast (iopamidol)
Comparison: CT neck 07/09/2019

CLINICAL DATA: Left submental mass.  No surgery in the area.

EXAM:
CT NECK WITH CONTRAST
TECHNIQUE: Multidetector CT imaging of the neck was performed using the
standard protocol following the bolus administration of intravenous
contrast.
CONTRAST:  75mL [id number] IOPAMIDOL ([id number]-M00) INJECTION 61%

[Series 3: neck · axial · 0.47mm/px · z∈[+775,+907]mm · 3 of 133 slices shown, 4 images]
[im 34/133  soft-tissue]
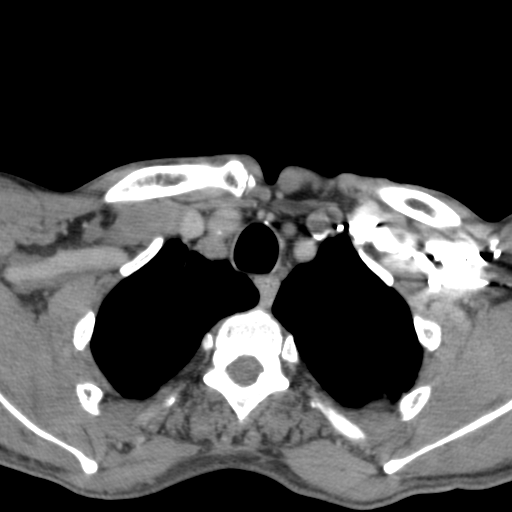
[im 34/133  bone]
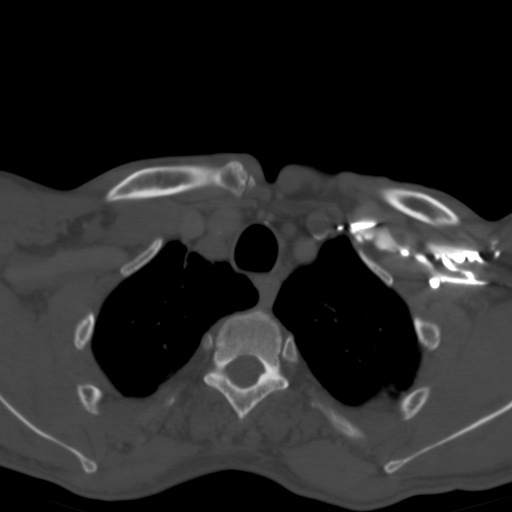
[im 67/133  bone]
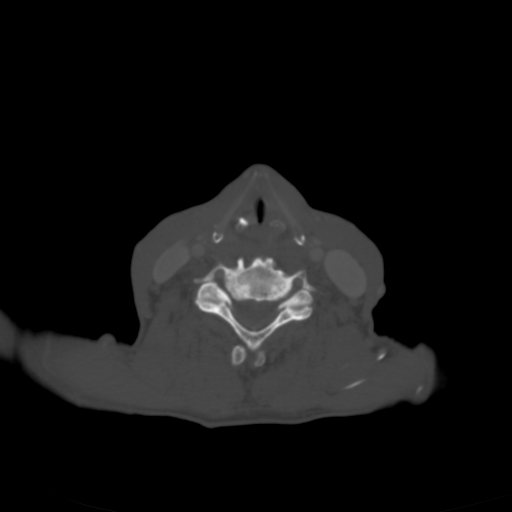
[im 100/133  bone]
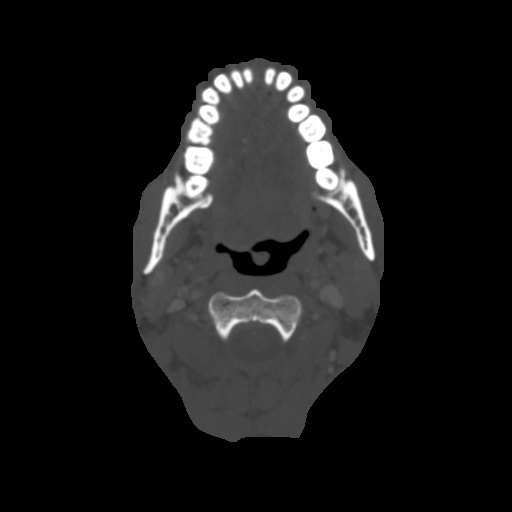

[Series 6: angled axial-oropharynx · axial · 0.46mm/px · z∈[+745,+901]mm · 4 of 133 slices shown]
[im 27/133  bone]
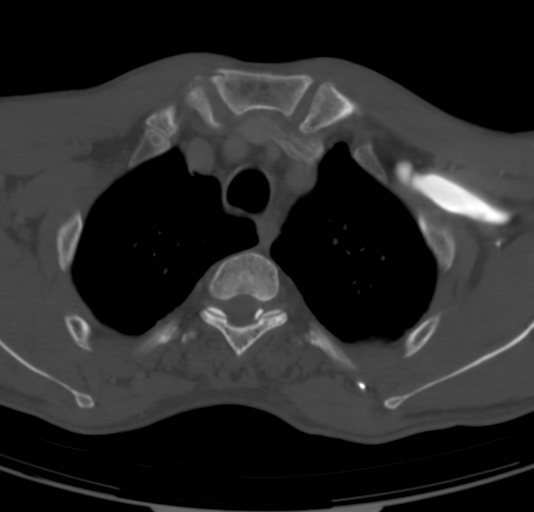
[im 53/133  bone]
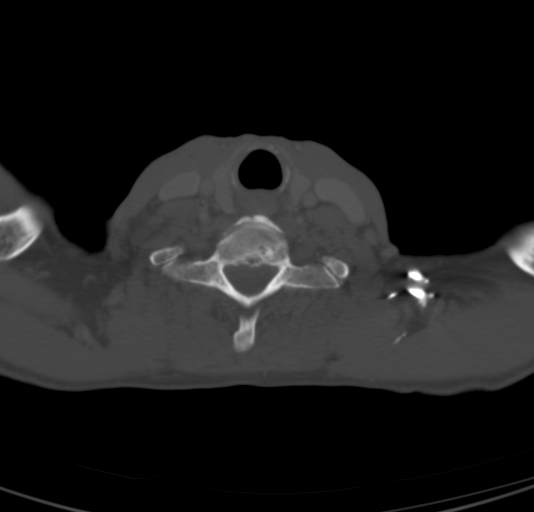
[im 80/133  bone]
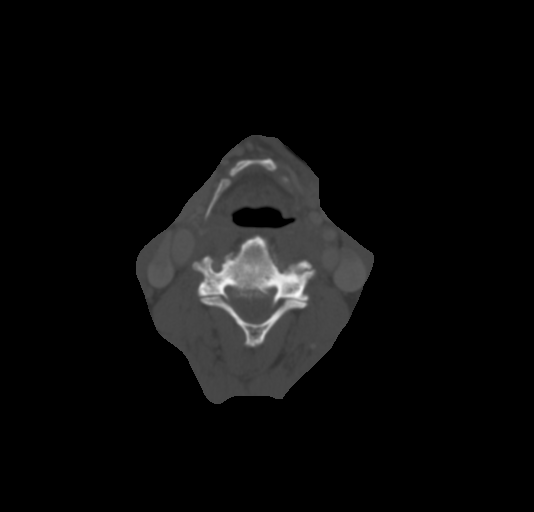
[im 106/133  bone]
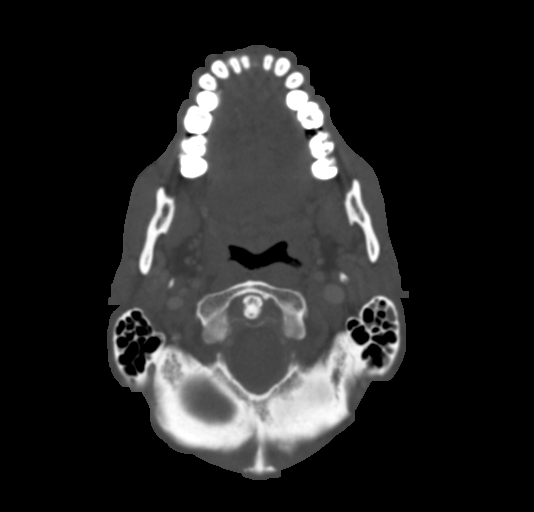

[7 of 14 positions shown; findings below may reference images not displayed]

FINDINGS: Pharynx and larynx: Normal. No mass or swelling.

Salivary glands: Dilated submandibular duct bilaterally. This was
present previously but appears progressive. No obstructing calculi
identified. There is an enhancing soft tissue mass in the floor of
the mouth eccentric to the left which has progressed and likely is
obstructing carcinoma. This may be arising from the sublingual
gland. Hyperenhancement of the submandibular gland bilaterally
likely due to inflammation. No glandular mass. Parotid normal
bilaterally.

Thyroid: Negative

Lymph nodes: 10 mm right level 1A lymph node has progressed. This
shows heterogeneous enhancement. 13 mm left level 1A lymph node with
heterogeneous enhancement also has progressed. These are likely
malignant nodes. 8mm left level 1 B lymph node medial to the
mandible on axial image 43 suspicious for tumor.

Subcentimeter level 2 lymph nodes bilaterally do not show abnormal
enhancement or enlargement.

Vascular: Normal vascular enhancement

Limited intracranial: Negative

Visualized orbits: Negative

Mastoids and visualized paranasal sinuses: Moderate mucoperiosteal
thickening in the maxillary sinus and sphenoid sinus. Mastoid sinus
clear bilaterally.

Skeleton: Moderate to advanced cervical spondylosis.

Poor dentition with multiple caries and periapical dental lucencies.

Upper chest: Bleb right apical lung with surgical resection clips in
the right apex. No acute abnormality.

Other: None
IMPRESSION: Progressive dilatation of the submandibular duct bilaterally without
stone. There is an enhancing mass in the floor of the mouth which
appears to be causing obstruction of both ducts. This is most likely
a carcinoma on the floor of the mouth likely from the submandibular
gland on the left.

There are enlarging heterogeneously enhancing level 1 A lymph nodes
bilaterally left greater than right compatible with metastatic
nodes. Additional possible level 1 B metastatic node.

## 2022-04-26 ENCOUNTER — Encounter (HOSPITAL_COMMUNITY): Payer: Medicaid Other | Admitting: Dentistry

## 2022-04-28 ENCOUNTER — Encounter: Payer: Self-pay | Admitting: Gastroenterology

## 2022-05-02 ENCOUNTER — Telehealth (HOSPITAL_COMMUNITY): Payer: Self-pay

## 2022-05-02 DIAGNOSIS — E43 Unspecified severe protein-calorie malnutrition: Secondary | ICD-10-CM | POA: Diagnosis not present

## 2022-05-05 ENCOUNTER — Encounter (HOSPITAL_COMMUNITY): Payer: Medicaid Other | Admitting: Dentistry

## 2022-06-02 DIAGNOSIS — E43 Unspecified severe protein-calorie malnutrition: Secondary | ICD-10-CM | POA: Diagnosis not present

## 2022-06-12 ENCOUNTER — Encounter: Payer: Self-pay | Admitting: Internal Medicine

## 2022-06-12 ENCOUNTER — Ambulatory Visit: Payer: Medicaid Other | Admitting: Internal Medicine

## 2022-06-14 ENCOUNTER — Encounter (HOSPITAL_COMMUNITY): Payer: Medicaid Other | Admitting: Dentistry

## 2022-06-22 ENCOUNTER — Encounter: Payer: Self-pay | Admitting: Radiology

## 2022-06-29 ENCOUNTER — Encounter: Payer: Self-pay | Admitting: Internal Medicine

## 2022-06-29 ENCOUNTER — Ambulatory Visit (INDEPENDENT_AMBULATORY_CARE_PROVIDER_SITE_OTHER): Payer: Medicaid Other | Admitting: Internal Medicine

## 2022-06-29 VITALS — BP 105/68 | HR 85 | Ht 69.0 in | Wt 133.0 lb

## 2022-06-29 DIAGNOSIS — C048 Malignant neoplasm of overlapping sites of floor of mouth: Secondary | ICD-10-CM

## 2022-06-29 DIAGNOSIS — G8929 Other chronic pain: Secondary | ICD-10-CM

## 2022-06-29 DIAGNOSIS — E43 Unspecified severe protein-calorie malnutrition: Secondary | ICD-10-CM

## 2022-06-29 DIAGNOSIS — M25561 Pain in right knee: Secondary | ICD-10-CM | POA: Diagnosis not present

## 2022-06-29 MED ORDER — CELECOXIB 100 MG PO CAPS: 100.0000 mg | ORAL_CAPSULE | Freq: Every day | ORAL | 3 refills | Status: DC

## 2022-06-29 NOTE — Patient Instructions (Signed)
Please take Celebrex for knee pain.  Please continue to take protein supplements regularly.

## 2022-06-29 NOTE — Progress Notes (Signed)
Established Patient Office Visit  Subjective:  Patient ID: Tanner Thomas, male    DOB: [birth year]  Age: 67 y.o. MRN: [id number]  CC:  Chief Complaint  Patient presents with   Knee Pain    Patient is stating his right knee is painful and swelling    HPI Tanner Thomas is a 67 y.o. 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  He reports chronic right knee pain and swelling.  He has seen Dr. Luna Glasgow, and had knee joint aspiration-55 cc.  He had temporary relief at that time.  His x-ray of knee showed tricompartmental syndrome/OA.  He has tried Tylenol without much relief.  He has gained 10 lbs since the last visit.  He is taking protein supplements regularly.  He has dentures in the upper jaw, but is undergoing evaluation for lower denture. He still eats semisolid or liquid diet.  Past Medical History:  Diagnosis Date   Acute upper GI bleed 02/05/2020   Calculus of gallbladder without cholecystitis without obstruction    E coli Pyelonephritis 01/16/2019   Head and neck lymphadenopathy 05/26/2020   Tobacco use disorder 07/05/2019    Past Surgical History:  Procedure Laterality Date   BILIARY DILATION  10/30/2019   Procedure: BILIARY DILATION;  Surgeon: Milus Banister, MD;  Location: [facility];  Service: Endoscopy;;   BILIARY DILATION  02/05/2020   Procedure: BILIARY DILATION;  Surgeon: Milus Banister, MD;  Location: [facility];  Service: Endoscopy;;   BILIARY STENT PLACEMENT N/A 10/30/2019   Procedure: BILIARY STENT PLACEMENT;  Surgeon: Milus Banister, MD;  Location: [facility];  Service: Endoscopy;  Laterality: N/A;   BILIARY STENT PLACEMENT N/A 02/05/2020   Procedure: BILIARY STENT PLACEMENT;  Surgeon: Milus Banister, MD;  Location: [facility];  Service: Endoscopy;  Laterality: N/A;   BIOPSY  03/06/2019   Procedure: BIOPSY;   Surgeon: Daneil Dolin, MD;  Location: [facility];  Service: Endoscopy;;  gastric   BIOPSY  02/05/2020   Procedure: BIOPSY;  Surgeon: Milus Banister, MD;  Location: [facility];  Service: Endoscopy;;  bile buct brushing   BIOPSY  11/14/2021   Procedure: BIOPSY;  Surgeon: Irving Copas., MD;  Location: [facility];  Service: Gastroenterology;;   CHOLECYSTECTOMY N/A 09/05/2019   Procedure: LAPAROSCOPIC CHOLECYSTECTOMY;  Surgeon: Aviva Signs, MD;  Location: [facility];  Service: General;  Laterality: N/A;   COLONOSCOPY WITH PROPOFOL N/A 03/06/2019   Procedure: COLONOSCOPY WITH PROPOFOL;  Surgeon: Daneil Dolin, MD;  [facility];  Service: Endoscopy;  Laterality: N/A;  9:45am - ok at 10:00 per Melanie   ENDOSCOPIC RETROGRADE CHOLANGIOPANCREATOGRAPHY (ERCP) WITH PROPOFOL N/A 10/30/2019   Procedure: ENDOSCOPIC RETROGRADE CHOLANGIOPANCREATOGRAPHY (ERCP) WITH PROPOFOL;  Surgeon: Milus Banister, MD;  Location: [facility];  Service: Endoscopy;  Laterality: N/A;   ENDOSCOPIC RETROGRADE CHOLANGIOPANCREATOGRAPHY (ERCP) WITH PROPOFOL N/A 02/05/2020   Procedure: ENDOSCOPIC RETROGRADE CHOLANGIOPANCREATOGRAPHY (ERCP) WITH PROPOFOL;  Surgeon: Milus Banister, MD;  Location: [facility];  Service: Endoscopy;  Laterality: N/A;   ENDOSCOPIC RETROGRADE CHOLANGIOPANCREATOGRAPHY (ERCP) WITH PROPOFOL N/A 11/14/2021   Procedure: ENDOSCOPIC RETROGRADE CHOLANGIOPANCREATOGRAPHY (ERCP) WITH PROPOFOL;  Surgeon: Rush Landmark Telford Nab., MD;  Location: [facility];  Service: Gastroenterology;  Laterality: N/A;   ERCP N/A 07/14/2021   Procedure: ENDOSCOPIC RETROGRADE CHOLANGIOPANCREATOGRAPHY (ERCP);  Surgeon: Milus Banister, MD;  Location: [facility];  Service: Endoscopy;  Laterality: N/A;   ESOPHAGOGASTRODUODENOSCOPY (EGD) WITH PROPOFOL N/A 03/06/2019   Procedure: ESOPHAGOGASTRODUODENOSCOPY (EGD) WITH PROPOFOL;  Surgeon: Daneil Dolin, MD;  Location: [facility];  Service: Endoscopy;   Laterality: N/A;   ESOPHAGOGASTRODUODENOSCOPY (EGD) WITH PROPOFOL N/A 08/21/2019   Procedure: ESOPHAGOGASTRODUODENOSCOPY (EGD) WITH PROPOFOL;  Surgeon: Milus Banister, MD;  Location: [facility];  Service: Endoscopy;  Laterality: N/A;   EUS N/A 08/21/2019   Procedure: UPPER ENDOSCOPIC ULTRASOUND (EUS) RADIAL;  Surgeon: Milus Banister, MD;  Location: [facility];  Service: Endoscopy;  Laterality: N/A;   PANCREAS SURGERY     REMOVAL OF STONES  10/30/2019   Procedure: REMOVAL OF STONES;  Surgeon: Milus Banister, MD;  Location: [facility];  Service: Endoscopy;;   REMOVAL OF STONES  02/05/2020   Procedure: REMOVAL OF STONES;  Surgeon: Milus Banister, MD;  Location: [facility];  Service: Endoscopy;;   REMOVAL OF STONES  07/14/2021   Procedure: REMOVAL OF SLUDGE;  Surgeon: Milus Banister, MD;  Location: [facility];  Service: Endoscopy;;   REMOVAL OF STONES  11/14/2021   Procedure: REMOVAL OF SLUDGE;  Surgeon: Irving Copas., MD;  Location: [facility];  Service: Gastroenterology;;   [name]  10/30/2019   Procedure: [name];  Surgeon: Milus Banister, MD;  Location: [facility];  Service: Endoscopy;;   SPHINCTEROTOMY  02/05/2020   Procedure: [name];  Surgeon: Milus Banister, MD;  Location: [facility];  Service: Endoscopy;;   SPYGLASS CHOLANGIOSCOPY N/A 02/05/2020   Procedure: [id number] CHOLANGIOSCOPY;  Surgeon: Milus Banister, MD;  Location: [facility];  Service: Endoscopy;  Laterality: N/A;   STENT REMOVAL  02/05/2020   Procedure: STENT REMOVAL;  Surgeon: Milus Banister, MD;  Location: [facility];  Service: Endoscopy;;   STENT REMOVAL  11/14/2021   Procedure: Lavell Islam REMOVAL;  Surgeon: Irving Copas., MD;  Location: [facility];  Service: Gastroenterology;;    Family History  Problem Relation Age of Onset   Heart disease Father    Colon cancer Neg Hx    Gastric cancer Neg Hx    Esophageal cancer Neg Hx     Social History    Socioeconomic History   Marital status: Single    Spouse name: Not on file   Number of children: Not on file   Years of education: Not on file   Highest education level: Not on file  Occupational History   Not on file  Tobacco Use   Smoking status: Former    Packs/day: 0.00    Years: 50.00    Total pack years: 0.00    Types: Cigarettes    Start date: 05/24/1967    Quit date: 06/2020    Years since quitting: 2.0   Smokeless tobacco: Never  Vaping Use   Vaping Use: Never used  Substance and Sexual Activity   Alcohol use: Not Currently   Drug use: Not Currently    Types: Marijuana   Sexual activity: Yes  Other Topics Concern   Not on file  Social History Narrative   Not on file   Social Determinants of Health   Financial Resource Strain: High Risk (07/29/2020)   Overall Financial Resource Strain (CARDIA)    Difficulty of Paying Living Expenses: Very hard  Food Insecurity: No Food Insecurity (04/05/2021)   Hunger Vital Sign    Worried About Running Out of Food in the Last Year: Never true    Ran Out of Food in the Last Year: Never true  Transportation Needs: No Transportation Needs (05/05/2021)   PRAPARE - Hydrologist (Medical): No    Lack of Transportation (Non-Medical): No  Physical Activity: Not on file  Stress: Not on file  Social Connections: Moderately Integrated (05/05/2021)   Social Connection and Isolation Panel [NHANES]    Frequency of Communication with Friends and Family: More than three times a week    Frequency of Social Gatherings with Friends and Family: More than three times a week    Attends Religious Services: More than 4 times per year    Active Member of [employer] or Organizations: Yes    Attends Music therapist: More than 4 times per year    Marital Status: Never married  Intimate Partner Violence: Not on file    Outpatient Medications Prior to Visit  Medication Sig Dispense Refill   lactose free  nutrition (BOOST) LIQD Take 237 mLs by mouth 2 (two) times daily between meals. 237 mL 10   acetaminophen (TYLENOL) 500 MG tablet Take 500 mg by mouth every 6 (six) hours as needed for mild pain.     feeding supplement (ENSURE ENLIVE / ENSURE PLUS) LIQD Take 237 mLs by mouth daily. 7110 mL 3   lidocaine (XYLOCAINE) 2 % solution Patient: Mix 1part 2% viscous lidocaine, 1part H20. Swish & swallow 12m of diluted mixture, 340m before meals and at bedtime, up to QID 300 mL 2   sodium fluoride (SODIUM FLUORIDE 5000 PPM) 1.1 % GEL dental gel Place 1 pea-size drop into each tooth space of fluoride trays once a day at bedtime.  Leave trays in for 5 minutes and then remove.  Spit out excess fluoride, but DO NOT rinse with water, eat or drink for at least 30 minutes after use. 100 mL 11   Sodium Fluoride 1.1 % PSTE Place 1 pea-size drop into each tooth space of fluoride trays once a day at bedtime.  Leave trays in for 5 minutes and then remove.  Spit out excess fluoride, but DO NOT rinse with water, eat or drink for at least 30 minutes after use. 100 mL 6   VITAMIN E PO Take 1 tablet by mouth in the morning.     No facility-administered medications prior to visit.    No Known Allergies  ROS Review of Systems  Constitutional:  Negative for chills and fever.  HENT:  Negative for congestion and sore throat.   Eyes:  Negative for pain and discharge.  Respiratory:  Negative for cough and shortness of breath.   Cardiovascular:  Negative for chest pain and palpitations.  Gastrointestinal:  Negative for constipation, diarrhea, nausea and vomiting.  Endocrine: Negative for polydipsia and polyuria.  Genitourinary:  Negative for dysuria and hematuria.  Musculoskeletal:  Positive for arthralgias. Negative for neck pain and neck stiffness.  Skin:  Negative for rash.  Neurological:  Negative for dizziness, weakness, numbness and headaches.  Psychiatric/Behavioral:  Negative for agitation and behavioral problems.        Objective:    Physical Exam Vitals reviewed.  Constitutional:      General: He is not in acute distress.    Appearance: He is cachectic. He is not diaphoretic.     Comments: Muscle wasting  HENT:     Head: Normocephalic and atraumatic.     Nose: Nose normal.     Mouth/Throat:     Mouth: Mucous membranes are moist.     Comments: S/p removal of lower teeth Eyes:  General: No scleral icterus.    Extraocular Movements: Extraocular movements intact.  Neck:     Comments: Neck incision site - C/D/I Cardiovascular:     Rate and Rhythm: Normal rate and regular rhythm.     Pulses: Normal pulses.     Heart sounds: Normal heart sounds. No murmur heard. Pulmonary:     Breath sounds: Normal breath sounds. No wheezing or rales.  Musculoskeletal:     Cervical back: Neck supple. No tenderness.     Right knee: Effusion present. Decreased range of motion. Tenderness present.     Right lower leg: No edema.     Left lower leg: No edema.  Skin:    General: Skin is warm.     Findings: No rash.  Neurological:     General: No focal deficit present.     Mental Status: He is alert and oriented to person, place, and time.     Sensory: No sensory deficit.     Motor: No weakness.  Psychiatric:        Mood and Affect: Mood normal.        Behavior: Behavior normal.     BP 105/68 (BP Location: Left Arm, Patient Position: Sitting, Cuff Size: Normal)   Pulse 85   Ht '5\' 9"'$  (1.753 m)   Wt 133 lb (60.3 kg)   SpO2 97%   BMI 19.64 kg/m  Wt Readings from Last 3 Encounters:  06/29/22 133 lb (60.3 kg)  02/07/22 123 lb 9.6 oz (56.1 kg)  11/14/21 120 lb (54.4 kg)    Lab Results  Component Value Date   TSH 3.820 11/04/2021   Lab Results  Component Value Date   WBC 4.9 05/03/2021   HGB 13.0 05/03/2021   HCT 38.6 05/03/2021   MCV 95 05/03/2021   PLT 144 (L) 05/03/2021   Lab Results  Component Value Date   NA 140 05/03/2021   K 4.9 05/03/2021   CO2 19 (L) 05/03/2021   GLUCOSE 88  05/03/2021   BUN 6 (L) 05/03/2021   CREATININE 0.75 (L) 05/03/2021   BILITOT 0.4 05/03/2021   ALKPHOS 83 05/03/2021   AST 65 (H) 05/03/2021   ALT 23 05/03/2021   PROT 7.6 05/03/2021   ALBUMIN 4.4 05/03/2021   CALCIUM 9.0 05/03/2021   ANIONGAP 12 01/17/2021   EGFR 101 05/03/2021   Lab Results  Component Value Date   CHOL 138 03/11/2019   Lab Results  Component Value Date   HDL 44 03/11/2019   Lab Results  Component Value Date   LDLCALC 76 03/11/2019   Lab Results  Component Value Date   TRIG 88 03/11/2019   Lab Results  Component Value Date   CHOLHDL 3.1 03/11/2019   Lab Results  Component Value Date   HGBA1C 5.8 (H) 03/11/2019      Assessment & Plan:   Problem List Items Addressed This Visit       Digestive   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 [facility])    S/p surgery and radiotherapy Followed by ENT and radiation oncology currently Surgical site well-healed Follows up with dentist for poor dentition as well        Other   Chronic pain of right knee - Primary    Has had a fall related injury to the right knee Has chronic right knee pain with swelling X-ray of knee showed OA/tricompartmental syndrome Takes Tylenol as needed Added Celebrex 100 mg QD Referred to orthopedic surgeon - had knee  joint aspiration If persistent knee pain and swelling, will discuss with orthopedic surgeon for TKA      Relevant Medications   celecoxib (CELEBREX) 100 MG capsule   Severe protein-calorie malnutrition ([place])    Had severe weight loss, likely due to poor dentition - now improving Now has dentures placed, advised to eat as tolerated Advised to take protein supplements       Meds ordered this encounter  Medications   celecoxib (CELEBREX) 100 MG capsule    Sig: Take 1 capsule (100 mg total) by mouth daily.    Dispense:  30 capsule    Refill:  3    Follow-up: Return in about 4 months (around 10/29/2022) for Knee OA.    Lindell Spar, MD

## 2022-06-29 NOTE — Assessment & Plan Note (Addendum)
Had severe weight loss, likely due to poor dentition - now improving Now has dentures placed, advised to eat as tolerated Advised to take protein supplements

## 2022-06-29 NOTE — Assessment & Plan Note (Signed)
S/p surgery and radiotherapy Followed by ENT and radiation oncology currently Surgical site well-healed Follows up with dentist for poor dentition as well

## 2022-06-29 NOTE — Assessment & Plan Note (Signed)
Has had a fall related injury to the right knee Has chronic right knee pain with swelling X-ray of knee showed OA/tricompartmental syndrome Takes Tylenol as needed Added Celebrex 100 mg QD Referred to orthopedic surgeon - had knee joint aspiration If persistent knee pain and swelling, will discuss with orthopedic surgeon for TKA

## 2022-06-30 DIAGNOSIS — E43 Unspecified severe protein-calorie malnutrition: Secondary | ICD-10-CM | POA: Diagnosis not present

## 2022-07-03 DIAGNOSIS — S1190XS Unspecified open wound of unspecified part of neck, sequela: Secondary | ICD-10-CM | POA: Diagnosis not present

## 2022-07-03 DIAGNOSIS — C049 Malignant neoplasm of floor of mouth, unspecified: Secondary | ICD-10-CM | POA: Diagnosis not present

## 2022-07-27 DIAGNOSIS — E43 Unspecified severe protein-calorie malnutrition: Secondary | ICD-10-CM | POA: Diagnosis not present

## 2022-08-25 DIAGNOSIS — E43 Unspecified severe protein-calorie malnutrition: Secondary | ICD-10-CM | POA: Diagnosis not present

## 2022-09-25 DIAGNOSIS — E43 Unspecified severe protein-calorie malnutrition: Secondary | ICD-10-CM | POA: Diagnosis not present

## 2022-10-31 ENCOUNTER — Ambulatory Visit (INDEPENDENT_AMBULATORY_CARE_PROVIDER_SITE_OTHER): Payer: Medicaid Other | Admitting: Internal Medicine

## 2022-10-31 ENCOUNTER — Encounter: Payer: Self-pay | Admitting: Internal Medicine

## 2022-10-31 VITALS — BP 96/69 | HR 83 | Ht 69.0 in | Wt 122.8 lb

## 2022-10-31 DIAGNOSIS — F331 Major depressive disorder, recurrent, moderate: Secondary | ICD-10-CM | POA: Diagnosis not present

## 2022-10-31 DIAGNOSIS — M1711 Unilateral primary osteoarthritis, right knee: Secondary | ICD-10-CM

## 2022-10-31 DIAGNOSIS — E43 Unspecified severe protein-calorie malnutrition: Secondary | ICD-10-CM | POA: Diagnosis not present

## 2022-10-31 MED ORDER — CELECOXIB 100 MG PO CAPS: 100.0000 mg | ORAL_CAPSULE | Freq: Every day | ORAL | 3 refills | Status: DC

## 2022-10-31 MED ORDER — MIRTAZAPINE 7.5 MG PO TABS: 7.5000 mg | ORAL_TABLET | Freq: Every day | ORAL | 3 refills | Status: DC

## 2022-10-31 NOTE — Progress Notes (Signed)
Established Patient Office Visit  Subjective:  Patient ID: Tanner Thomas, male    DOB: 02-May-1955  Age: 67 y.o. MRN: [id number]  CC:  Chief Complaint  Patient presents with   Joint Swelling    Patient is having swelling and pain in knee going down into his foot.    HPI Tanner Thomas is a 67 y.o. 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  He reports chronic right knee pain and swelling.  He has seen Dr. Hilda Lias, and had knee joint aspiration-55 cc.  He had temporary relief at that time.  His x-ray of knee showed tricompartmental syndrome/OA.  He has tried Tylenol without much relief.  He was given Celebrex, which had helped with knee pain, but he did not get refill.  He has lost 11 lbs since the last visit.  He is taking protein supplements regularly.  He has dentures in the upper jaw, but is undergoing evaluation for lower denture. He still eats semisolid or liquid diet.  His he fiance also reports that he has lack of appetite.  Denies any fever, chills, chronic cough, hemoptysis, LAD or night sweats.  Past Medical History:  Diagnosis Date   Acute upper GI bleed 02/05/2020   Calculus of gallbladder without cholecystitis without obstruction    E coli Pyelonephritis 01/16/2019   Head and neck lymphadenopathy 05/26/2020   Tobacco use disorder 07/05/2019    Past Surgical History:  Procedure Laterality Date   BILIARY DILATION  10/30/2019   Procedure: BILIARY DILATION;  Surgeon: Rachael Fee, MD;  Location: [facility];  Service: Endoscopy;;   BILIARY DILATION  02/05/2020   Procedure: BILIARY DILATION;  Surgeon: Rachael Fee, MD;  Location: [facility];  Service: Endoscopy;;   BILIARY STENT PLACEMENT N/A 10/30/2019   Procedure: BILIARY STENT PLACEMENT;  Surgeon: Rachael Fee, MD;  Location: [facility];  Service: Endoscopy;  Laterality:  N/A;   BILIARY STENT PLACEMENT N/A 02/05/2020   Procedure: BILIARY STENT PLACEMENT;  Surgeon: Rachael Fee, MD;  Location: [facility];  Service: Endoscopy;  Laterality: N/A;   BIOPSY  03/06/2019   Procedure: BIOPSY;  Surgeon: Corbin Ade, MD;  Location: [facility];  Service: Endoscopy;;  gastric   BIOPSY  02/05/2020   Procedure: BIOPSY;  Surgeon: Rachael Fee, MD;  Location: [facility];  Service: Endoscopy;;  bile buct brushing   BIOPSY  11/14/2021   Procedure: BIOPSY;  Surgeon: Lemar Lofty., MD;  Location: [facility];  Service: Gastroenterology;;   CHOLECYSTECTOMY N/A 09/05/2019   Procedure: LAPAROSCOPIC CHOLECYSTECTOMY;  Surgeon: Franky Macho, MD;  Location: [facility];  Service: General;  Laterality: N/A;   COLONOSCOPY WITH PROPOFOL N/A 03/06/2019   Procedure: COLONOSCOPY WITH PROPOFOL;  Surgeon: Corbin Ade, MD;  [facility];  Service: Endoscopy;  Laterality: N/A;  9:45am - ok at 10:00 per Melanie   ENDOSCOPIC RETROGRADE CHOLANGIOPANCREATOGRAPHY (ERCP) WITH PROPOFOL N/A 10/30/2019   Procedure: ENDOSCOPIC RETROGRADE CHOLANGIOPANCREATOGRAPHY (ERCP) WITH PROPOFOL;  Surgeon: Rachael Fee, MD;  Location: [facility];  Service: Endoscopy;  Laterality: N/A;   ENDOSCOPIC RETROGRADE CHOLANGIOPANCREATOGRAPHY (ERCP) WITH PROPOFOL N/A 02/05/2020   Procedure: ENDOSCOPIC RETROGRADE CHOLANGIOPANCREATOGRAPHY (ERCP) WITH PROPOFOL;  Surgeon: Rachael Fee, MD;  Location: [facility];  Service: Endoscopy;  Laterality: N/A;   ENDOSCOPIC RETROGRADE CHOLANGIOPANCREATOGRAPHY (ERCP) WITH PROPOFOL N/A 11/14/2021   Procedure: ENDOSCOPIC  RETROGRADE CHOLANGIOPANCREATOGRAPHY (ERCP) WITH PROPOFOL;  Surgeon: Meridee Score Netty Starring., MD;  Location: [facility];  Service: Gastroenterology;  Laterality: N/A;   ERCP N/A 07/14/2021   Procedure: ENDOSCOPIC RETROGRADE CHOLANGIOPANCREATOGRAPHY (ERCP);  Surgeon: Rachael Fee, MD;  Location: [facility];  Service: Endoscopy;   Laterality: N/A;   ESOPHAGOGASTRODUODENOSCOPY (EGD) WITH PROPOFOL N/A 03/06/2019   Procedure: ESOPHAGOGASTRODUODENOSCOPY (EGD) WITH PROPOFOL;  Surgeon: Corbin Ade, MD;  [facility];  Service: Endoscopy;  Laterality: N/A;   ESOPHAGOGASTRODUODENOSCOPY (EGD) WITH PROPOFOL N/A 08/21/2019   Procedure: ESOPHAGOGASTRODUODENOSCOPY (EGD) WITH PROPOFOL;  Surgeon: Rachael Fee, MD;  Location: [facility];  Service: Endoscopy;  Laterality: N/A;   EUS N/A 08/21/2019   Procedure: UPPER ENDOSCOPIC ULTRASOUND (EUS) RADIAL;  Surgeon: Rachael Fee, MD;  Location: [facility];  Service: Endoscopy;  Laterality: N/A;   PANCREAS SURGERY     REMOVAL OF STONES  10/30/2019   Procedure: REMOVAL OF STONES;  Surgeon: Rachael Fee, MD;  Location: [facility];  Service: Endoscopy;;   REMOVAL OF STONES  02/05/2020   Procedure: REMOVAL OF STONES;  Surgeon: Rachael Fee, MD;  Location: [facility];  Service: Endoscopy;;   REMOVAL OF STONES  07/14/2021   Procedure: REMOVAL OF SLUDGE;  Surgeon: Rachael Fee, MD;  Location: [facility];  Service: Endoscopy;;   REMOVAL OF STONES  11/14/2021   Procedure: REMOVAL OF SLUDGE;  Surgeon: Lemar Lofty., MD;  Location: [facility];  Service: Gastroenterology;;   Dennison Mascot  10/30/2019   Procedure: Dennison Mascot;  Surgeon: Rachael Fee, MD;  Location: [facility];  Service: Endoscopy;;   SPHINCTEROTOMY  02/05/2020   Procedure: Dennison Mascot;  Surgeon: Rachael Fee, MD;  Location: [facility];  Service: Endoscopy;;   SPYGLASS CHOLANGIOSCOPY N/A 02/05/2020   Procedure: EAVWUJWJ CHOLANGIOSCOPY;  Surgeon: Rachael Fee, MD;  Location: [facility];  Service: Endoscopy;  Laterality: N/A;   STENT REMOVAL  02/05/2020   Procedure: STENT REMOVAL;  Surgeon: Rachael Fee, MD;  Location: [facility];  Service: Endoscopy;;   STENT REMOVAL  11/14/2021   Procedure: Francine Graven REMOVAL;  Surgeon: Lemar Lofty., MD;  Location: [facility];  Service: Gastroenterology;;    Family History  Problem Relation Age of Onset   Heart disease Father    Colon cancer Neg Hx    Gastric cancer Neg Hx    Esophageal cancer Neg Hx     Social History   Socioeconomic History   Marital status: Single    Spouse name: Not on file   Number of children: Not on file   Years of education: Not on file   Highest education level: Not on file  Occupational History   Not on file  Tobacco Use   Smoking status: Former    Packs/day: 0.00    Years: 50.00    Additional pack years: 0.00    Total pack years: 0.00    Types: Cigarettes    Start date: 05/24/1967    Quit date: 06/2020    Years since quitting: 2.3   Smokeless tobacco: Never  Vaping Use   Vaping Use: Never used  Substance and Sexual Activity   Alcohol use: Not Currently   Drug use: Not Currently    Types: Marijuana   Sexual activity: Yes  Other Topics Concern   Not on file  Social History Narrative   Not on file   Social Determinants of Health   Financial Resource Strain: High Risk (07/29/2020)   Overall Financial Resource Strain (CARDIA)  Difficulty of Paying Living Expenses: Very hard  Food Insecurity: No Food Insecurity (04/05/2021)   Hunger Vital Sign    Worried About Running Out of Food in the Last Year: Never true    Ran Out of Food in the Last Year: Never true  Transportation Needs: No Transportation Needs (05/05/2021)   PRAPARE - Administrator, Civil Service (Medical): No    Lack of Transportation (Non-Medical): No  Physical Activity: Not on file  Stress: Not on file  Social Connections: Moderately Integrated (05/05/2021)   Social Connection and Isolation Panel [NHANES]    Frequency of Communication with Friends and Family: More than three times a week    Frequency of Social Gatherings with Friends and Family: More than three times a week    Attends Religious Services: More than 4 times per year    Active Member of [employer] or Organizations:  Yes    Attends Engineer, structural: More than 4 times per year    Marital Status: Never married  Intimate Partner Violence: Not on file    Outpatient Medications Prior to Visit  Medication Sig Dispense Refill   lactose free nutrition (BOOST) LIQD Take 237 mLs by mouth 2 (two) times daily between meals. 237 mL 10   acetaminophen (TYLENOL) 500 MG tablet Take 500 mg by mouth every 6 (six) hours as needed for mild pain.     feeding supplement (ENSURE ENLIVE / ENSURE PLUS) LIQD Take 237 mLs by mouth daily. 7110 mL 3   lidocaine (XYLOCAINE) 2 % solution Patient: Mix 1part 2% viscous lidocaine, 1part H20. Swish & swallow 10mL of diluted mixture, before meals and at bedtime, up to QID 300 mL 2   sodium fluoride (SODIUM FLUORIDE 5000 PPM) 1.1 % GEL dental gel Place 1 pea-size drop into each tooth space of fluoride trays once a day at bedtime.  Leave trays in for 5 minutes and then remove.  Spit out excess fluoride, but DO NOT rinse with water, eat or drink for at least 30 minutes after use. 100 mL 11   Sodium Fluoride 1.1 % PSTE Place 1 pea-size drop into each tooth space of fluoride trays once a day at bedtime.  Leave trays in for 5 minutes and then remove.  Spit out excess fluoride, but DO NOT rinse with water, eat or drink for at least 30 minutes after use. 100 mL 6   VITAMIN E PO Take 1 tablet by mouth in the morning.     celecoxib (CELEBREX) 100 MG capsule Take 1 capsule (100 mg total) by mouth daily. 30 capsule 3   No facility-administered medications prior to visit.    No Known Allergies  ROS Review of Systems  Constitutional:  Positive for appetite change and unexpected weight change. Negative for chills and fever.  HENT:  Negative for congestion and sore throat.   Eyes:  Negative for pain and discharge.  Respiratory:  Negative for cough and shortness of breath.   Cardiovascular:  Negative for chest pain and palpitations.  Gastrointestinal:  Negative for constipation,  diarrhea, nausea and vomiting.  Endocrine: Negative for polydipsia and polyuria.  Genitourinary:  Negative for dysuria and hematuria.  Musculoskeletal:  Positive for arthralgias. Negative for neck pain and neck stiffness.  Skin:  Negative for rash.  Neurological:  Negative for dizziness, weakness, numbness and headaches.  Psychiatric/Behavioral:  Positive for dysphoric mood. Negative for agitation and behavioral problems.       Objective:    Physical  Exam Vitals reviewed.  Constitutional:      General: He is not in acute distress.    Appearance: He is cachectic. He is not diaphoretic.     Comments: Muscle wasting  HENT:     Head: Normocephalic and atraumatic.     Nose: Nose normal.     Mouth/Throat:     Mouth: Mucous membranes are moist.     Comments: S/p removal of lower teeth Eyes:     General: No scleral icterus.    Extraocular Movements: Extraocular movements intact.  Neck:     Comments: Neck incision site - C/D/I Cardiovascular:     Rate and Rhythm: Normal rate and regular rhythm.     Pulses: Normal pulses.     Heart sounds: Normal heart sounds. No murmur heard. Pulmonary:     Breath sounds: Normal breath sounds. No wheezing or rales.  Musculoskeletal:     Cervical back: Neck supple. No tenderness.     Right knee: Effusion present. Decreased range of motion. Tenderness present.     Right lower leg: No edema.     Left lower leg: No edema.  Skin:    General: Skin is warm.     Findings: No rash.  Neurological:     General: No focal deficit present.     Mental Status: He is alert and oriented to person, place, and time.     Sensory: No sensory deficit.     Motor: No weakness.  Psychiatric:        Mood and Affect: Mood normal.        Behavior: Behavior normal.     BP 96/69 (BP Location: Left Arm, Patient Position: Sitting, Cuff Size: Normal)   Pulse 83   Ht 5\' 9"  (1.753 m)   Wt 122 lb 12.8 oz (55.7 kg)   SpO2 92%   BMI 18.13 kg/m  Wt Readings from Last 3  Encounters:  10/31/22 122 lb 12.8 oz (55.7 kg)  06/29/22 133 lb (60.3 kg)  02/07/22 123 lb 9.6 oz (56.1 kg)    Lab Results  Component Value Date   TSH 3.820 11/04/2021   Lab Results  Component Value Date   WBC 4.9 05/03/2021   HGB 13.0 05/03/2021   HCT 38.6 05/03/2021   MCV 95 05/03/2021   PLT 144 (L) 05/03/2021   Lab Results  Component Value Date   NA 140 05/03/2021   K 4.9 05/03/2021   CO2 19 (L) 05/03/2021   GLUCOSE 88 05/03/2021   BUN 6 (L) 05/03/2021   CREATININE 0.75 (L) 05/03/2021   BILITOT 0.4 05/03/2021   ALKPHOS 83 05/03/2021   AST 65 (H) 05/03/2021   ALT 23 05/03/2021   PROT 7.6 05/03/2021   ALBUMIN 4.4 05/03/2021   CALCIUM 9.0 05/03/2021   ANIONGAP 12 01/17/2021   EGFR 101 05/03/2021   Lab Results  Component Value Date   CHOL 138 03/11/2019   Lab Results  Component Value Date   HDL 44 03/11/2019   Lab Results  Component Value Date   LDLCALC 76 03/11/2019   Lab Results  Component Value Date   TRIG 88 03/11/2019   Lab Results  Component Value Date   CHOLHDL 3.1 03/11/2019   Lab Results  Component Value Date   HGBA1C 5.8 (H) 03/11/2019      Assessment & Plan:   Problem List Items Addressed This Visit       Musculoskeletal and Integument   Primary osteoarthritis of right knee  Had has had orthopedic surgery evaluation, had knee aspiration-55 cc Was advised for right TKA, but he preferred to wait at that time Celebrex as needed for pain, also alternate with Tylenol arthritis Referred to orthopedic surgeon      Relevant Medications   celecoxib (CELEBREX) 100 MG capsule   Other Relevant Orders   Ambulatory referral to Orthopedic Surgery     Other   Severe protein-calorie malnutrition (HCC) - Primary    BMI Readings from Last 3 Encounters:  10/31/22 18.13 kg/m  06/29/22 19.64 kg/m  02/07/22 18.25 kg/m  Had severe weight loss, likely due to poor dentition - was improving, but has lost 11 lbs since the last visit Now has  upper dentures placed, still need lower dentures, advised to eat as tolerated Advised to take protein supplements Started Remeron to improve appetite      Relevant Medications   mirtazapine (REMERON) 7.5 MG tablet   Moderate episode of recurrent major depressive disorder (HCC)    Flowsheet Row Office Visit from 10/31/2022 in [facility]  PHQ-9 Total Score 12     Uncontrolled His underlying medical conditions also responsible for his symptoms Started Remeron to improve appetite and sleep      Relevant Medications   mirtazapine (REMERON) 7.5 MG tablet    Meds ordered this encounter  Medications   mirtazapine (REMERON) 7.5 MG tablet    Sig: Take 1 tablet (7.5 mg total) by mouth at bedtime.    Dispense:  30 tablet    Refill:  3   celecoxib (CELEBREX) 100 MG capsule    Sig: Take 1 capsule (100 mg total) by mouth daily.    Dispense:  30 capsule    Refill:  3    Follow-up: Return in about 4 months (around 03/03/2023) for Annual physical.    Anabel Halon, MD

## 2022-10-31 NOTE — Patient Instructions (Signed)
Please take Celecoxib as needed for knee pain.  Please continue to take protein supplement daily.  Please start taking Mirtazepine at bedtime - to improve appetite and sleep.  Please eat at regular intervals and get dental evaluation for denture.

## 2022-10-31 NOTE — Assessment & Plan Note (Signed)
Had has had orthopedic surgery evaluation, had knee aspiration-55 cc Was advised for right TKA, but he preferred to wait at that time Celebrex as needed for pain, also alternate with Tylenol arthritis Referred to orthopedic surgeon

## 2022-10-31 NOTE — Assessment & Plan Note (Signed)
Flowsheet Row Office Visit from 10/31/2022 in [facility]  PHQ-9 Total Score 12      Uncontrolled His underlying medical conditions also responsible for his symptoms Started Remeron to improve appetite and sleep

## 2022-10-31 NOTE — Assessment & Plan Note (Deleted)
Followed by radiation oncology 

## 2022-10-31 NOTE — Assessment & Plan Note (Signed)
BMI Readings from Last 3 Encounters:  10/31/22 18.13 kg/m  06/29/22 19.64 kg/m  02/07/22 18.25 kg/m   Had severe weight loss, likely due to poor dentition - was improving, but has lost 11 lbs since the last visit Now has upper dentures placed, still need lower dentures, advised to eat as tolerated Advised to take protein supplements Started Remeron to improve appetite

## 2022-11-07 ENCOUNTER — Other Ambulatory Visit (INDEPENDENT_AMBULATORY_CARE_PROVIDER_SITE_OTHER): Payer: Medicaid Other

## 2022-11-07 ENCOUNTER — Ambulatory Visit: Payer: Medicaid Other | Admitting: Orthopedic Surgery

## 2022-11-07 ENCOUNTER — Encounter: Payer: Self-pay | Admitting: Orthopedic Surgery

## 2022-11-07 VITALS — BP 108/76 | HR 98 | Ht 70.0 in | Wt 124.0 lb

## 2022-11-07 DIAGNOSIS — M1711 Unilateral primary osteoarthritis, right knee: Secondary | ICD-10-CM

## 2022-11-07 DIAGNOSIS — G8929 Other chronic pain: Secondary | ICD-10-CM

## 2022-11-07 NOTE — Patient Instructions (Signed)
 Instructions Following Joint Injections  In clinic today, you received an injection in one of your joints (sometimes more than one).  Occasionally, you can have some pain at the injection site, this is normal.  You can place ice at the injection site, or take over-the-counter medications such as Tylenol (acetaminophen) or Advil (ibuprofen).  Please follow all directions listed on the bottle.  If your joint (knee or shoulder) becomes swollen, red or very painful, please contact the clinic for additional assistance.   Two medications were injected, including lidocaine and a steroid (often referred to as cortisone).  Lidocaine is effective almost immediately but wears off quickly.  However, the steroid can take a few days to improve your symptoms.  In some cases, it can make your pain worse for a couple of days.  Do not be concerned if this happens as it is common.  You can apply ice or take some over-the-counter medications as needed.   

## 2022-11-07 NOTE — Progress Notes (Signed)
New Patient Visit  Assessment: Tanner Thomas is a 67 y.o. male with the following: 1. Arthritis of right knee  Plan: Cherre Blanc has severe right knee arthritis.  This has been causing him issues for years.  He reports an injury to his right knee greater than 30 years ago.  He uses a brace.  He takes medicines as needed.  Based on the radiographs, and his current presentation, he may benefit from total knee arthroplasty.  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  I question his nutrition.  He also continues to smoke.  Based on these factors, I do not think that he is a good candidate for knee replacement at this time.  I explained this to him in great detail.  If he is interested in pursuing surgery, I would have him see Dr. Romeo Apple.  In clinic today, I have offered him an aspiration, as well as a steroid injection, and he elected to proceed.   Procedure note - aspiration right knee joint   Verbal consent was obtained to aspirate the right knee joint  Timeout was completed to confirm the site of aspiration. The skin was prepped with alcohol and ethyl chloride was sprayed at the aspiration site.  An 18-gauge needle was inserted and 80 cc of clear joint fluid was aspirated using a superolateral approach.  Using the same needle, 40 mg of Depo-Medrol, as well as 4 cc of 1% lidocaine were injected into the knee. There were no complications. A sterile bandage was applied.   Follow-up: Return if symptoms worsen or fail to improve.  Subjective:  Chief Complaint  Patient presents with   Knee Pain    Right     History of Present Illness: Tanner Thomas is a 67 y.o. male who has been referred by  Trena Platt, MD for evaluation of right knee pain.  He has had pain in the right knee for several years.  He has previously seen Dr. Hilda Lias.  He reports that he injured his right knee in 1980.  His knee progressively got better.  He was able to  return to his previous level of activities.  However, he has had progressively worsening pain in the right knee.  He has had injections.  More recently, he has been treated for oral cancer.  He is no longer undergoing treatment, but is seeing doctors in follow-up.  Pain is diffuse throughout the right knee.  He uses a knee brace.  He has not had an injection for a while.   Review of Systems: No fevers or chills No numbness or tingling No chest pain No shortness of breath No bowel or bladder dysfunction No GI distress No headaches   Medical History:  Past Medical History:  Diagnosis Date   Acute upper GI bleed 02/05/2020   Calculus of gallbladder without cholecystitis without obstruction    E coli Pyelonephritis 01/16/2019   Head and neck lymphadenopathy 05/26/2020   Tobacco use disorder 07/05/2019    Past Surgical History:  Procedure Laterality Date   BILIARY DILATION  10/30/2019   Procedure: BILIARY DILATION;  Surgeon: Rachael Fee, MD;  Location: [facility];  Service: Endoscopy;;   BILIARY DILATION  02/05/2020   Procedure: BILIARY DILATION;  Surgeon: Rachael Fee, MD;  Location: [facility];  Service: Endoscopy;;   BILIARY STENT PLACEMENT N/A 10/30/2019   Procedure: BILIARY STENT PLACEMENT;  Surgeon: Rachael Fee, MD;  Location: [facility];  Service: Endoscopy;  Laterality: N/A;   BILIARY STENT PLACEMENT N/A 02/05/2020   Procedure: BILIARY STENT PLACEMENT;  Surgeon: Rachael Fee, MD;  Location: [facility];  Service: Endoscopy;  Laterality: N/A;   BIOPSY  03/06/2019   Procedure: BIOPSY;  Surgeon: Corbin Ade, MD;  Location: [facility];  Service: Endoscopy;;  gastric   BIOPSY  02/05/2020   Procedure: BIOPSY;  Surgeon: Rachael Fee, MD;  Location: [facility];  Service: Endoscopy;;  bile buct brushing   BIOPSY  11/14/2021   Procedure: BIOPSY;  Surgeon: Lemar Lofty., MD;  Location: [facility];  Service: Gastroenterology;;   CHOLECYSTECTOMY  N/A 09/05/2019   Procedure: LAPAROSCOPIC CHOLECYSTECTOMY;  Surgeon: Franky Macho, MD;  Location: [facility];  Service: General;  Laterality: N/A;   COLONOSCOPY WITH PROPOFOL N/A 03/06/2019   Procedure: COLONOSCOPY WITH PROPOFOL;  Surgeon: Corbin Ade, MD;  Location: [facility];  Service: Endoscopy;  Laterality: N/A;  9:45am - ok at 10:00 per Melanie   ENDOSCOPIC RETROGRADE CHOLANGIOPANCREATOGRAPHY (ERCP) WITH PROPOFOL N/A 10/30/2019   Procedure: ENDOSCOPIC RETROGRADE CHOLANGIOPANCREATOGRAPHY (ERCP) WITH PROPOFOL;  Surgeon: Rachael Fee, MD;  Location: [facility];  Service: Endoscopy;  Laterality: N/A;   ENDOSCOPIC RETROGRADE CHOLANGIOPANCREATOGRAPHY (ERCP) WITH PROPOFOL N/A 02/05/2020   Procedure: ENDOSCOPIC RETROGRADE CHOLANGIOPANCREATOGRAPHY (ERCP) WITH PROPOFOL;  Surgeon: Rachael Fee, MD;  Location: [facility];  Service: Endoscopy;  Laterality: N/A;   ENDOSCOPIC RETROGRADE CHOLANGIOPANCREATOGRAPHY (ERCP) WITH PROPOFOL N/A 11/14/2021   Procedure: ENDOSCOPIC RETROGRADE CHOLANGIOPANCREATOGRAPHY (ERCP) WITH PROPOFOL;  Surgeon: Meridee Score Netty Starring., MD;  Location: [facility];  Service: Gastroenterology;  Laterality: N/A;   ERCP N/A 07/14/2021   Procedure: ENDOSCOPIC RETROGRADE CHOLANGIOPANCREATOGRAPHY (ERCP);  Surgeon: Rachael Fee, MD;  Location: [facility];  Service: Endoscopy;  Laterality: N/A;   ESOPHAGOGASTRODUODENOSCOPY (EGD) WITH PROPOFOL N/A 03/06/2019   Procedure: ESOPHAGOGASTRODUODENOSCOPY (EGD) WITH PROPOFOL;  Surgeon: Corbin Ade, MD;  Location: [facility];  Service: Endoscopy;  Laterality: N/A;   ESOPHAGOGASTRODUODENOSCOPY (EGD) WITH PROPOFOL N/A 08/21/2019   Procedure: ESOPHAGOGASTRODUODENOSCOPY (EGD) WITH PROPOFOL;  Surgeon: Rachael Fee, MD;  Location: [facility];  Service: Endoscopy;  Laterality: N/A;   EUS N/A 08/21/2019   Procedure: UPPER ENDOSCOPIC ULTRASOUND (EUS) RADIAL;  Surgeon: Rachael Fee, MD;  Location: [facility];  Service:  Endoscopy;  Laterality: N/A;   PANCREAS SURGERY     REMOVAL OF STONES  10/30/2019   Procedure: REMOVAL OF STONES;  Surgeon: Rachael Fee, MD;  Location: [facility];  Service: Endoscopy;;   REMOVAL OF STONES  02/05/2020   Procedure: REMOVAL OF STONES;  Surgeon: Rachael Fee, MD;  Location: [facility];  Service: Endoscopy;;   REMOVAL OF STONES  07/14/2021   Procedure: REMOVAL OF SLUDGE;  Surgeon: Rachael Fee, MD;  Location: [facility];  Service: Endoscopy;;   REMOVAL OF STONES  11/14/2021   Procedure: REMOVAL OF SLUDGE;  Surgeon: Lemar Lofty., MD;  Location: [facility];  Service: Gastroenterology;;   Dennison Mascot  10/30/2019   Procedure: Dennison Mascot;  Surgeon: Rachael Fee, MD;  Location: [facility];  Service: Endoscopy;;   SPHINCTEROTOMY  02/05/2020   Procedure: Dennison Mascot;  Surgeon: Rachael Fee, MD;  Location: [facility];  Service: Endoscopy;;   SPYGLASS CHOLANGIOSCOPY N/A 02/05/2020   Procedure: ZOXWRUEA CHOLANGIOSCOPY;  Surgeon: Rachael Fee, MD;  Location: [facility];  Service: Endoscopy;  Laterality: N/A;   STENT REMOVAL  02/05/2020   Procedure: STENT REMOVAL;  Surgeon: Rachael Fee, MD;  Location: [facility];  Service: Endoscopy;;   STENT REMOVAL  11/14/2021   Procedure: STENT REMOVAL;  Surgeon: Lemar Lofty., MD;  Location: [facility];  Service: Gastroenterology;;    Family History  Problem Relation Age of Onset   Heart disease Father    Colon cancer Neg Hx    Gastric cancer Neg Hx    Esophageal cancer Neg Hx    Social History   Tobacco Use   Smoking status: Former    Current packs/day: 0.00    Types: Cigarettes    Start date: 05/24/1967    Quit date: 06/2020    Years since quitting: 2.3   Smokeless tobacco: Never  Vaping Use   Vaping status: Never Used  Substance Use Topics   Alcohol use: Not Currently   Drug use: Not Currently    Types: Marijuana    No Known Allergies  Current Meds   Medication Sig   acetaminophen (TYLENOL) 500 MG tablet Take 500 mg by mouth every 6 (six) hours as needed for mild pain.   celecoxib (CELEBREX) 100 MG capsule Take 1 capsule (100 mg total) by mouth daily.   feeding supplement (ENSURE ENLIVE / ENSURE PLUS) LIQD Take 237 mLs by mouth daily.   lactose free nutrition (BOOST) LIQD Take 237 mLs by mouth 2 (two) times daily between meals.   lidocaine (XYLOCAINE) 2 % solution Patient: Mix 1part 2% viscous lidocaine, 1part H20. Swish & swallow 10mL of diluted mixture, before meals and at bedtime, up to QID   mirtazapine (REMERON) 7.5 MG tablet Take 1 tablet (7.5 mg total) by mouth at bedtime.   sodium fluoride (SODIUM FLUORIDE 5000 PPM) 1.1 % GEL dental gel Place 1 pea-size drop into each tooth space of fluoride trays once a day at bedtime.  Leave trays in for 5 minutes and then remove.  Spit out excess fluoride, but DO NOT rinse with water, eat or drink for at least 30 minutes after use.   Sodium Fluoride 1.1 % PSTE Place 1 pea-size drop into each tooth space of fluoride trays once a day at bedtime.  Leave trays in for 5 minutes and then remove.  Spit out excess fluoride, but DO NOT rinse with water, eat or drink for at least 30 minutes after use.   VITAMIN E PO Take 1 tablet by mouth in the morning.    Objective: BP 108/76   Pulse 98   Ht 5\' 10"  (1.778 m)   Wt 124 lb (56.2 kg)   BMI 17.79 kg/m   Physical Exam:  General: Alert and oriented. and No acute distress.  Thin male Gait: Right-sided antalgic gait  Evaluation of the right knee demonstrates an effusion.  No redness.  He has good range of motion.  2B Lachman.  No increased laxity varus valgus stress.  Obvious deformity about the knee, with mild valgus angulation overall.    IMAGING: I personally ordered and reviewed the following images  X-rays of the right knee were obtained in clinic today.  No acute injuries are noted.  Mild valgus alignment.  Advanced degenerative changes,  with complete loss of joint space within the medial and lateral compartment.  Also loss of joint space within the patellofemoral compartment with large osteophytes.  Impression: Severe right knee arthritis   New Medications:  No orders of the defined types were placed in this encounter.     Oliver Barre, MD  11/07/2022 1:12 PM

## 2023-03-06 ENCOUNTER — Ambulatory Visit (INDEPENDENT_AMBULATORY_CARE_PROVIDER_SITE_OTHER): Payer: Medicare PPO | Admitting: Internal Medicine

## 2023-03-06 ENCOUNTER — Encounter: Payer: Self-pay | Admitting: Internal Medicine

## 2023-03-06 VITALS — BP 123/76 | HR 72 | Ht 70.0 in | Wt 125.0 lb

## 2023-03-06 DIAGNOSIS — Z23 Encounter for immunization: Secondary | ICD-10-CM | POA: Diagnosis not present

## 2023-03-06 DIAGNOSIS — C048 Malignant neoplasm of overlapping sites of floor of mouth: Secondary | ICD-10-CM

## 2023-03-06 DIAGNOSIS — F331 Major depressive disorder, recurrent, moderate: Secondary | ICD-10-CM | POA: Diagnosis not present

## 2023-03-06 DIAGNOSIS — E43 Unspecified severe protein-calorie malnutrition: Secondary | ICD-10-CM

## 2023-03-06 DIAGNOSIS — K045 Chronic apical periodontitis: Secondary | ICD-10-CM | POA: Diagnosis not present

## 2023-03-06 DIAGNOSIS — M1711 Unilateral primary osteoarthritis, right knee: Secondary | ICD-10-CM | POA: Diagnosis not present

## 2023-03-06 DIAGNOSIS — Z0001 Encounter for general adult medical examination with abnormal findings: Secondary | ICD-10-CM

## 2023-03-06 MED ORDER — MIRTAZAPINE 7.5 MG PO TABS: 7.5000 mg | ORAL_TABLET | Freq: Every day | ORAL | 5 refills | Status: DC

## 2023-03-06 NOTE — Assessment & Plan Note (Signed)
Has had orthopedic surgery evaluation, had knee aspiration- 80 cc Was advised for right TKA, but he preferred to wait at that time Celebrex as needed for pain, also alternate with Tylenol arthritis

## 2023-03-06 NOTE — Assessment & Plan Note (Signed)
Physical exam as documented. Denies fasting blood tests. Flu vaccine today. Advised to get Shingrix and Tdap vaccines at pharmacy.

## 2023-03-06 NOTE — Assessment & Plan Note (Signed)
Needs to establish with a Dentist

## 2023-03-06 NOTE — Assessment & Plan Note (Signed)
Flowsheet Row Office Visit from 03/06/2023 in [facility]  PHQ-9 Total Score 0       His underlying medical conditions were also responsible for his symptoms Started Remeron to improve appetite and sleep - needs to remain compliant

## 2023-03-06 NOTE — Patient Instructions (Addendum)
Please start taking Mirtazepine as prescribed for improving appetite. Continue taking protein supplement regularly.  You are being referred to Dentist.  Please consider getting Shingrix and Tdap vaccine at local pharmacy.

## 2023-03-06 NOTE — Assessment & Plan Note (Addendum)
S/p surgery and radiotherapy Followed by ENT currently Surgical site well-healed Follows up with dentist for poor dentition as well, but needs to find a new Dentist as his previous Dentist retired - referred to Education officer, community at [employer]

## 2023-03-06 NOTE — Assessment & Plan Note (Signed)
BMI Readings from Last 3 Encounters:  03/06/23 17.94 kg/m  11/07/22 17.79 kg/m  10/31/22 18.13 kg/m   Had severe weight loss, likely due to poor dentition - was improving initially Now has upper dentures placed, still need lower dentures, advised to eat as tolerated - needs to see Dentist Advised to take protein supplements Needs to take Remeron regularly Started Remeron to improve appetite

## 2023-03-06 NOTE — Progress Notes (Signed)
Established Patient Office Visit  Subjective:  Patient ID: Tanner Thomas, male    DOB: 04-Mar-1956  Age: 67 y.o. MRN: [id number]  CC:  Chief Complaint  Patient presents with   Annual Exam    HPI Tanner Thomas is a 67 y.o. 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  He reports chronic right knee pain and swelling.  He has seen Dr. Dallas Schimke, and had knee joint aspiration- 80 cc.  He had temporary relief at that time.  His x-ray of knee showed tricompartmental syndrome/OA.  He has tried Tylenol without much relief.  He was given Celebrex, which had helped with knee pain.   He has gained 3 lbs since the last visit.  He is taking protein supplements regularly.  He has dentures in the upper jaw, but is undergoing evaluation for lower denture. He still eats semisolid or liquid diet. He has lack of appetite, but still does not take Mirtazepine.  Denies any fever, chills, chronic cough, hemoptysis, LAD or night sweats.   Past Medical History:  Diagnosis Date   Acute upper GI bleed 02/05/2020   Calculus of gallbladder without cholecystitis without obstruction    E coli Pyelonephritis 01/16/2019   Head and neck lymphadenopathy 05/26/2020   Tobacco use disorder 07/05/2019    Past Surgical History:  Procedure Laterality Date   BILIARY DILATION  10/30/2019   Procedure: BILIARY DILATION;  Surgeon: Rachael Fee, MD;  Location: [facility];  Service: Endoscopy;;   BILIARY DILATION  02/05/2020   Procedure: BILIARY DILATION;  Surgeon: Rachael Fee, MD;  Location: [facility];  Service: Endoscopy;;   BILIARY STENT PLACEMENT N/A 10/30/2019   Procedure: BILIARY STENT PLACEMENT;  Surgeon: Rachael Fee, MD;  Location: [facility];  Service: Endoscopy;  Laterality: N/A;   BILIARY STENT PLACEMENT N/A 02/05/2020   Procedure: BILIARY STENT PLACEMENT;  Surgeon: Rachael Fee, MD;   Location: [facility];  Service: Endoscopy;  Laterality: N/A;   BIOPSY  03/06/2019   Procedure: BIOPSY;  Surgeon: Corbin Ade, MD;  [facility];  Service: Endoscopy;;  gastric   BIOPSY  02/05/2020   Procedure: BIOPSY;  Surgeon: Rachael Fee, MD;  Location: [facility];  Service: Endoscopy;;  bile buct brushing   BIOPSY  11/14/2021   Procedure: BIOPSY;  Surgeon: Lemar Lofty., MD;  Location: [facility];  Service: Gastroenterology;;   CHOLECYSTECTOMY N/A 09/05/2019   Procedure: LAPAROSCOPIC CHOLECYSTECTOMY;  Surgeon: Franky Macho, MD;  Location: [facility];  Service: General;  Laterality: N/A;   COLONOSCOPY WITH PROPOFOL N/A 03/06/2019   Procedure: COLONOSCOPY WITH PROPOFOL;  Surgeon: Corbin Ade, MD;  [facility];  Service: Endoscopy;  Laterality: N/A;  9:45am - ok at 10:00 per Melanie   ENDOSCOPIC RETROGRADE CHOLANGIOPANCREATOGRAPHY (ERCP) WITH PROPOFOL N/A 10/30/2019   Procedure: ENDOSCOPIC RETROGRADE CHOLANGIOPANCREATOGRAPHY (ERCP) WITH PROPOFOL;  Surgeon: Rachael Fee, MD;  [facility]: [facility];  Service: Endoscopy;  Laterality: N/A;   ENDOSCOPIC RETROGRADE CHOLANGIOPANCREATOGRAPHY (ERCP) WITH PROPOFOL N/A 02/05/2020   Procedure: ENDOSCOPIC RETROGRADE CHOLANGIOPANCREATOGRAPHY (ERCP) WITH PROPOFOL;  Surgeon: Rachael Fee, MD;  [facility]: [facility];  Service: Endoscopy;  Laterality: N/A;   ENDOSCOPIC RETROGRADE CHOLANGIOPANCREATOGRAPHY (ERCP) WITH PROPOFOL N/A 11/14/2021   Procedure: ENDOSCOPIC RETROGRADE CHOLANGIOPANCREATOGRAPHY (ERCP) WITH PROPOFOL;  Surgeon: Meridee Score Netty Starring., MD;  Location: [facility];  Service: Gastroenterology;  Laterality: N/A;   ERCP  N/A 07/14/2021   Procedure: ENDOSCOPIC RETROGRADE CHOLANGIOPANCREATOGRAPHY (ERCP);  Surgeon: Rachael Fee, MD;  Location: [facility];  Service: Endoscopy;  Laterality: N/A;   ESOPHAGOGASTRODUODENOSCOPY (EGD) WITH PROPOFOL N/A 03/06/2019   Procedure: ESOPHAGOGASTRODUODENOSCOPY  (EGD) WITH PROPOFOL;  Surgeon: Corbin Ade, MD;  [facility];  Service: Endoscopy;  Laterality: N/A;   ESOPHAGOGASTRODUODENOSCOPY (EGD) WITH PROPOFOL N/A 08/21/2019   Procedure: ESOPHAGOGASTRODUODENOSCOPY (EGD) WITH PROPOFOL;  Surgeon: Rachael Fee, MD;  [facility]: [facility];  Service: Endoscopy;  Laterality: N/A;   EUS N/A 08/21/2019   Procedure: UPPER ENDOSCOPIC ULTRASOUND (EUS) RADIAL;  Surgeon: Rachael Fee, MD;  Location: [facility];  Service: Endoscopy;  Laterality: N/A;   PANCREAS SURGERY     REMOVAL OF STONES  10/30/2019   Procedure: REMOVAL OF STONES;  Surgeon: Rachael Fee, MD;  Location: [facility];  Service: Endoscopy;;   REMOVAL OF STONES  02/05/2020   Procedure: REMOVAL OF STONES;  Surgeon: Rachael Fee, MD;  Location: [facility];  Service: Endoscopy;;   REMOVAL OF STONES  07/14/2021   Procedure: REMOVAL OF SLUDGE;  Surgeon: Rachael Fee, MD;  Location: [facility];  Service: Endoscopy;;   REMOVAL OF STONES  11/14/2021   Procedure: REMOVAL OF SLUDGE;  Surgeon: Lemar Lofty., MD;  Location: [facility];  Service: Gastroenterology;;   Dennison Mascot  10/30/2019   Procedure: Dennison Mascot;  Surgeon: Rachael Fee, MD;  Location: [facility];  Service: Endoscopy;;   SPHINCTEROTOMY  02/05/2020   Procedure: Dennison Mascot;  Surgeon: Rachael Fee, MD;  Location: [facility];  Service: Endoscopy;;   SPYGLASS CHOLANGIOSCOPY N/A 02/05/2020   Procedure: ZOXWRUEA CHOLANGIOSCOPY;  Surgeon: Rachael Fee, MD;  Location: [facility];  Service: Endoscopy;  Laterality: N/A;   STENT REMOVAL  02/05/2020   Procedure: STENT REMOVAL;  Surgeon: Rachael Fee, MD;  Location: [facility];  Service: Endoscopy;;   STENT REMOVAL  11/14/2021   Procedure: Francine Graven REMOVAL;  Surgeon: Lemar Lofty., MD;  Location: [facility];  Service: Gastroenterology;;    Family History  Problem Relation Age of Onset   Heart disease Father     Colon cancer Neg Hx    Gastric cancer Neg Hx    Esophageal cancer Neg Hx     Social History   Socioeconomic History   Marital status: Single    Spouse name: Not on file   Number of children: Not on file   Years of education: Not on file   Highest education level: Not on file  Occupational History   Not on file  Tobacco Use   Smoking status: Former    Current packs/day: 0.00    Types: Cigarettes    Start date: 05/24/1967    Quit date: 06/2020    Years since quitting: 2.7   Smokeless tobacco: Never  Vaping Use   Vaping status: Never Used  Substance and Sexual Activity   Alcohol use: Not Currently   Drug use: Not Currently    Types: Marijuana   Sexual activity: Yes  Other Topics Concern   Not on file  Social History Narrative   Not on file   Social Determinants of Health   Financial Resource Strain: High Risk (07/29/2020)   Overall Financial Resource Strain (CARDIA)    Difficulty of Paying Living Expenses: Very hard  Food Insecurity: Low Risk  (01/24/2023)   Received from [facility]   Hunger Vital Sign    Worried About Running Out of Food in the Last Year: Never  true    Ran Out of Food in the Last Year: Never true  Transportation Needs: No Transportation Needs (01/24/2023)   Received from [facility]    In the past 12 months, has lack of reliable transportation kept you from medical appointments, meetings, work or from getting things needed for daily living? : No  Physical Activity: Not on file  Stress: Not on file  Social Connections: Moderately Integrated (05/05/2021)   Social Connection and Isolation Panel [NHANES]    Frequency of Communication with Friends and Family: More than three times a week    Frequency of Social Gatherings with Friends and Family: More than three times a week    Attends Religious Services: More than 4 times per year    Active Member of [employer] or Organizations: Yes    Attends Engineer, structural: More than 4  times per year    Marital Status: Never married  Intimate Partner Violence: Not on file    Outpatient Medications Prior to Visit  Medication Sig Dispense Refill   acetaminophen (TYLENOL) 500 MG tablet Take 500 mg by mouth every 6 (six) hours as needed for mild pain.     celecoxib (CELEBREX) 100 MG capsule Take 1 capsule (100 mg total) by mouth daily. 30 capsule 3   feeding supplement (ENSURE ENLIVE / ENSURE PLUS) LIQD Take 237 mLs by mouth daily. 7110 mL 3   lactose free nutrition (BOOST) LIQD Take 237 mLs by mouth 2 (two) times daily between meals. 237 mL 10   lidocaine (XYLOCAINE) 2 % solution Patient: Mix 1part 2% viscous lidocaine, 1part H20. Swish & swallow 10mL of diluted mixture, before meals and at bedtime, up to QID 300 mL 2   sodium fluoride (SODIUM FLUORIDE 5000 PPM) 1.1 % GEL dental gel Place 1 pea-size drop into each tooth space of fluoride trays once a day at bedtime.  Leave trays in for 5 minutes and then remove.  Spit out excess fluoride, but DO NOT rinse with water, eat or drink for at least 30 minutes after use. 100 mL 11   Sodium Fluoride 1.1 % PSTE Place 1 pea-size drop into each tooth space of fluoride trays once a day at bedtime.  Leave trays in for 5 minutes and then remove.  Spit out excess fluoride, but DO NOT rinse with water, eat or drink for at least 30 minutes after use. 100 mL 6   VITAMIN E PO Take 1 tablet by mouth in the morning.     mirtazapine (REMERON) 7.5 MG tablet Take 1 tablet (7.5 mg total) by mouth at bedtime. 30 tablet 3   No facility-administered medications prior to visit.    No Known Allergies  ROS Review of Systems  Constitutional:  Positive for appetite change and unexpected weight change. Negative for chills and fever.  HENT:  Negative for congestion and sore throat.   Eyes:  Negative for pain and discharge.  Respiratory:  Negative for cough and shortness of breath.   Cardiovascular:  Negative for chest pain and palpitations.   Gastrointestinal:  Negative for constipation, diarrhea, nausea and vomiting.  Endocrine: Negative for polydipsia and polyuria.  Genitourinary:  Negative for dysuria and hematuria.  Musculoskeletal:  Positive for arthralgias. Negative for neck pain and neck stiffness.  Skin:  Negative for rash.  Neurological:  Negative for dizziness, weakness, numbness and headaches.  Psychiatric/Behavioral:  Negative for agitation and behavioral problems.       Objective:  Physical Exam Vitals reviewed.  Constitutional:      General: He is not in acute distress.    Appearance: He is cachectic. He is not diaphoretic.     Comments: Muscle wasting  HENT:     Head: Normocephalic and atraumatic.     Nose: Nose normal.     Mouth/Throat:     Mouth: Mucous membranes are moist.     Comments: S/p removal of lower teeth Eyes:     General: No scleral icterus.    Extraocular Movements: Extraocular movements intact.  Neck:     Comments: Neck incision site - C/D/I Cardiovascular:     Rate and Rhythm: Normal rate and regular rhythm.     Pulses: Normal pulses.     Heart sounds: Normal heart sounds. No murmur heard. Pulmonary:     Breath sounds: Normal breath sounds. No wheezing or rales.  Abdominal:     Palpations: Abdomen is soft.     Tenderness: There is no abdominal tenderness.  Musculoskeletal:     Cervical back: Neck supple. No tenderness.     Right knee: Effusion present. Decreased range of motion. Tenderness present.     Right lower leg: No edema.     Left lower leg: No edema.  Skin:    General: Skin is warm.     Findings: No rash.  Neurological:     General: No focal deficit present.     Mental Status: He is alert and oriented to person, place, and time.     Sensory: No sensory deficit.     Motor: No weakness.  Psychiatric:        Mood and Affect: Mood normal.        Behavior: Behavior normal.     BP 123/76 (BP Location: Right Arm, Patient Position: Sitting, Cuff Size: Normal)    Pulse 72   Ht 5\' 10"  (1.778 m)   Wt 125 lb (56.7 kg)   SpO2 97%   BMI 17.94 kg/m  Wt Readings from Last 3 Encounters:  03/06/23 125 lb (56.7 kg)  11/07/22 124 lb (56.2 kg)  10/31/22 122 lb 12.8 oz (55.7 kg)    Lab Results  Component Value Date   TSH 3.820 11/04/2021   Lab Results  Component Value Date   WBC 4.9 05/03/2021   HGB 13.0 05/03/2021   HCT 38.6 05/03/2021   MCV 95 05/03/2021   PLT 144 (L) 05/03/2021   Lab Results  Component Value Date   NA 140 05/03/2021   K 4.9 05/03/2021   CO2 19 (L) 05/03/2021   GLUCOSE 88 05/03/2021   BUN 6 (L) 05/03/2021   CREATININE 0.75 (L) 05/03/2021   BILITOT 0.4 05/03/2021   ALKPHOS 83 05/03/2021   AST 65 (H) 05/03/2021   ALT 23 05/03/2021   PROT 7.6 05/03/2021   ALBUMIN 4.4 05/03/2021   CALCIUM 9.0 05/03/2021   ANIONGAP 12 01/17/2021   EGFR 101 05/03/2021   Lab Results  Component Value Date   CHOL 138 03/11/2019   Lab Results  Component Value Date   HDL 44 03/11/2019   Lab Results  Component Value Date   LDLCALC 76 03/11/2019   Lab Results  Component Value Date   TRIG 88 03/11/2019   Lab Results  Component Value Date   CHOLHDL 3.1 03/11/2019   Lab Results  Component Value Date   HGBA1C 5.8 (H) 03/11/2019      Assessment & Plan:   Problem List Items Addressed This Visit  Digestive   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 [facility]    S/p surgery and radiotherapy Followed by ENT currently Surgical site well-healed Follows up with dentist for poor dentition as well, but needs to find a new Dentist as his previous Dentist retired - referred to Education officer, community at [employer] [facility]      Chronic apical periodontitis    Needs to establish with a Dentist      Relevant Orders   Ambulatory referral to Dentistry     Musculoskeletal and Integument   Primary osteoarthritis of right knee    Has had orthopedic surgery evaluation, had knee aspiration- 80 cc Was advised for right TKA, but he preferred to  wait at that time Celebrex as needed for pain, also alternate with Tylenol arthritis        Other   Severe protein-calorie malnutrition (HCC)    BMI Readings from Last 3 Encounters:  03/06/23 17.94 kg/m  11/07/22 17.79 kg/m  10/31/22 18.13 kg/m   Had severe weight loss, likely due to poor dentition - was improving initially Now has upper dentures placed, still need lower dentures, advised to eat as tolerated - needs to see Dentist Advised to take protein supplements Needs to take Remeron regularly Started Remeron to improve appetite      Relevant Medications   mirtazapine (REMERON) 7.5 MG tablet   Encounter for general adult medical examination with abnormal findings - Primary    Physical exam as documented. Denies fasting blood tests. Flu vaccine today. Advised to get Shingrix and Tdap vaccines at pharmacy.      Moderate episode of recurrent major depressive disorder (HCC)    Flowsheet Row Office Visit from 03/06/2023 in [facility]  PHQ-9 Total Score 0       His underlying medical conditions were also responsible for his symptoms Started Remeron to improve appetite and sleep - needs to remain compliant      Relevant Medications   mirtazapine (REMERON) 7.5 MG tablet   Other Visit Diagnoses     Encounter for immunization       Relevant Orders   Flu Vaccine Trivalent High Dose (Fluad) (Completed)       Meds ordered this encounter  Medications   mirtazapine (REMERON) 7.5 MG tablet    Sig: Take 1 tablet (7.5 mg total) by mouth at bedtime.    Dispense:  30 tablet    Refill:  5    Follow-up: Return in about 3 months (around 06/06/2023) for AWV.    Anabel Halon, MD

## 2023-03-28 DIAGNOSIS — C049 Malignant neoplasm of floor of mouth, unspecified: Secondary | ICD-10-CM | POA: Diagnosis not present

## 2023-03-28 DIAGNOSIS — R29898 Other symptoms and signs involving the musculoskeletal system: Secondary | ICD-10-CM | POA: Diagnosis not present

## 2023-06-06 ENCOUNTER — Encounter: Payer: Self-pay | Admitting: Internal Medicine

## 2023-06-06 ENCOUNTER — Ambulatory Visit: Payer: Medicare PPO | Admitting: Internal Medicine

## 2023-06-06 VITALS — BP 122/84 | HR 67 | Ht 69.0 in | Wt 127.8 lb

## 2023-06-06 DIAGNOSIS — E43 Unspecified severe protein-calorie malnutrition: Secondary | ICD-10-CM | POA: Diagnosis not present

## 2023-06-06 DIAGNOSIS — Z0000 Encounter for general adult medical examination without abnormal findings: Secondary | ICD-10-CM | POA: Insufficient documentation

## 2023-06-06 DIAGNOSIS — K045 Chronic apical periodontitis: Secondary | ICD-10-CM

## 2023-06-06 DIAGNOSIS — Z23 Encounter for immunization: Secondary | ICD-10-CM

## 2023-06-06 DIAGNOSIS — Z0001 Encounter for general adult medical examination with abnormal findings: Secondary | ICD-10-CM | POA: Diagnosis not present

## 2023-06-06 MED ORDER — AMOXICILLIN-POT CLAVULANATE 875-125 MG PO TABS: 1.0000 | ORAL_TABLET | Freq: Two times a day (BID) | ORAL | 0 refills | Status: DC

## 2023-06-06 MED ORDER — MIRTAZAPINE 7.5 MG PO TABS: 7.5000 mg | ORAL_TABLET | Freq: Every day | ORAL | 5 refills | Status: DC

## 2023-06-06 NOTE — Assessment & Plan Note (Signed)
Screening questionnaire reviewed Physical exam as documented. PCV20 vaccine today. Advised to get Shingrix and Tdap vaccines at local pharmacy. Denies routine blood tests.

## 2023-06-06 NOTE — Patient Instructions (Addendum)
Please start taking Augmentin as prescribed.  Please start taking Mirtazepine to improve appetite.  Please continue protein supplement at least twice daily.  Please consider getting Shingrix and Tdap vaccine at local pharmacy.   You are being referred to Dentist. [facility] [phone] [address] [place], [place], [place] [address]

## 2023-06-06 NOTE — Progress Notes (Signed)
Subjective:    Tanner Thomas is a 68 y.o. male who presents for a Welcome to Medicare exam.         Objective:    Today's Vitals   06/06/23 1450  BP: 122/84  Pulse: 67  SpO2: 94%  Weight: 127 lb 12.8 oz (58 kg)  Height: 5\' 9"  (1.753 m)  PainSc: 0-No pain   Body mass index is 18.87 kg/m.  Medications Outpatient Encounter Medications as of 06/06/2023  Medication Sig   acetaminophen (TYLENOL) 500 MG tablet Take 500 mg by mouth every 6 (six) hours as needed for mild pain.   celecoxib (CELEBREX) 100 MG capsule Take 1 capsule (100 mg total) by mouth daily.   feeding supplement (ENSURE ENLIVE / ENSURE PLUS) LIQD Take 237 mLs by mouth daily.   lactose free nutrition (BOOST) LIQD Take 237 mLs by mouth 2 (two) times daily between meals.   lidocaine (XYLOCAINE) 2 % solution Patient: Mix 1part 2% viscous lidocaine, 1part H20. Swish & swallow 10mL of diluted mixture, before meals and at bedtime, up to QID   mirtazapine (REMERON) 7.5 MG tablet Take 1 tablet (7.5 mg total) by mouth at bedtime.   sodium fluoride (SODIUM FLUORIDE 5000 PPM) 1.1 % GEL dental gel Place 1 pea-size drop into each tooth space of fluoride trays once a day at bedtime.  Leave trays in for 5 minutes and then remove.  Spit out excess fluoride, but DO NOT rinse with water, eat or drink for at least 30 minutes after use.   Sodium Fluoride 1.1 % PSTE Place 1 pea-size drop into each tooth space of fluoride trays once a day at bedtime.  Leave trays in for 5 minutes and then remove.  Spit out excess fluoride, but DO NOT rinse with water, eat or drink for at least 30 minutes after use.   VITAMIN E PO Take 1 tablet by mouth in the morning.   No facility-administered encounter medications on file as of 06/06/2023.     History: Past Medical History:  Diagnosis Date   Acute upper GI bleed 02/05/2020   Calculus of gallbladder without cholecystitis without obstruction    E coli Pyelonephritis 01/16/2019   Head and neck  lymphadenopathy 05/26/2020   Tobacco use disorder 07/05/2019   Past Surgical History:  Procedure Laterality Date   BILIARY DILATION  10/30/2019   Procedure: BILIARY DILATION;  Surgeon: Rachael Fee, MD;  Location: [facility];  Service: Endoscopy;;   BILIARY DILATION  02/05/2020   Procedure: BILIARY DILATION;  Surgeon: Rachael Fee, MD;  Location: [facility];  Service: Endoscopy;;   BILIARY STENT PLACEMENT N/A 10/30/2019   Procedure: BILIARY STENT PLACEMENT;  Surgeon: Rachael Fee, MD;  Location: [facility];  Service: Endoscopy;  Laterality: N/A;   BILIARY STENT PLACEMENT N/A 02/05/2020   Procedure: BILIARY STENT PLACEMENT;  Surgeon: Rachael Fee, MD;  Location: [facility];  Service: Endoscopy;  Laterality: N/A;   BIOPSY  03/06/2019   Procedure: BIOPSY;  Surgeon: Corbin Ade, MD;  [facility];  Service: Endoscopy;;  gastric   BIOPSY  02/05/2020   Procedure: BIOPSY;  Surgeon: Rachael Fee, MD;  Location: [facility];  Service: Endoscopy;;  bile buct brushing   BIOPSY  11/14/2021   Procedure: BIOPSY;  Surgeon: Lemar Lofty., MD;  Location: [facility];  Service: Gastroenterology;;   CHOLECYSTECTOMY N/A 09/05/2019   Procedure: LAPAROSCOPIC CHOLECYSTECTOMY;  Surgeon: Franky Macho, MD;  Location: [facility];  Service: General;  Laterality: N/A;   COLONOSCOPY WITH PROPOFOL N/A 03/06/2019   Procedure: COLONOSCOPY WITH PROPOFOL;  Surgeon: Corbin Ade, MD;  [facility];  Service: Endoscopy;  Laterality: N/A;  9:45am - ok at 10:00 per Melanie   ENDOSCOPIC RETROGRADE CHOLANGIOPANCREATOGRAPHY (ERCP) WITH PROPOFOL N/A 10/30/2019   Procedure: ENDOSCOPIC RETROGRADE CHOLANGIOPANCREATOGRAPHY (ERCP) WITH PROPOFOL;  Surgeon: Rachael Fee, MD;  Location: [facility];  Service: Endoscopy;  Laterality: N/A;   ENDOSCOPIC RETROGRADE CHOLANGIOPANCREATOGRAPHY (ERCP) WITH PROPOFOL N/A 02/05/2020   Procedure: ENDOSCOPIC RETROGRADE CHOLANGIOPANCREATOGRAPHY  (ERCP) WITH PROPOFOL;  Surgeon: Rachael Fee, MD;  [facility]: [facility];  Service: Endoscopy;  Laterality: N/A;   ENDOSCOPIC RETROGRADE CHOLANGIOPANCREATOGRAPHY (ERCP) WITH PROPOFOL N/A 11/14/2021   Procedure: ENDOSCOPIC RETROGRADE CHOLANGIOPANCREATOGRAPHY (ERCP) WITH PROPOFOL;  Surgeon: Meridee Score Netty Starring., MD;  Location: [facility];  Service: Gastroenterology;  Laterality: N/A;   ERCP N/A 07/14/2021   Procedure: ENDOSCOPIC RETROGRADE CHOLANGIOPANCREATOGRAPHY (ERCP);  Surgeon: Rachael Fee, MD;  Location: [facility];  Service: Endoscopy;  Laterality: N/A;   ESOPHAGOGASTRODUODENOSCOPY (EGD) WITH PROPOFOL N/A 03/06/2019   Procedure: ESOPHAGOGASTRODUODENOSCOPY (EGD) WITH PROPOFOL;  Surgeon: Corbin Ade, MD;  [facility];  Service: Endoscopy;  Laterality: N/A;   ESOPHAGOGASTRODUODENOSCOPY (EGD) WITH PROPOFOL N/A 08/21/2019   Procedure: ESOPHAGOGASTRODUODENOSCOPY (EGD) WITH PROPOFOL;  Surgeon: Rachael Fee, MD;  Location: [facility];  Service: Endoscopy;  Laterality: N/A;   EUS N/A 08/21/2019   Procedure: UPPER ENDOSCOPIC ULTRASOUND (EUS) RADIAL;  Surgeon: Rachael Fee, MD;  Location: [facility];  Service: Endoscopy;  Laterality: N/A;   PANCREAS SURGERY     REMOVAL OF STONES  10/30/2019   Procedure: REMOVAL OF STONES;  Surgeon: Rachael Fee, MD;  Location: [facility];  Service: Endoscopy;;   REMOVAL OF STONES  02/05/2020   Procedure: REMOVAL OF STONES;  Surgeon: Rachael Fee, MD;  Location: [facility];  Service: Endoscopy;;   REMOVAL OF STONES  07/14/2021   Procedure: REMOVAL OF SLUDGE;  Surgeon: Rachael Fee, MD;  Location: [facility];  Service: Endoscopy;;   REMOVAL OF STONES  11/14/2021   Procedure: REMOVAL OF SLUDGE;  Surgeon: Lemar Lofty., MD;  Location: [facility];  Service: Gastroenterology;;   Dennison Mascot  10/30/2019   Procedure: Dennison Mascot;  Surgeon: Rachael Fee, MD;  Location: [facility];  Service: Endoscopy;;    SPHINCTEROTOMY  02/05/2020   Procedure: Dennison Mascot;  Surgeon: Rachael Fee, MD;  Location: [facility];  Service: Endoscopy;;   SPYGLASS CHOLANGIOSCOPY N/A 02/05/2020   Procedure: JYNWGNFA CHOLANGIOSCOPY;  Surgeon: Rachael Fee, MD;  Location: [facility];  Service: Endoscopy;  Laterality: N/A;   STENT REMOVAL  02/05/2020   Procedure: STENT REMOVAL;  Surgeon: Rachael Fee, MD;  Location: [facility];  Service: Endoscopy;;   STENT REMOVAL  11/14/2021   Procedure: STENT REMOVAL;  Surgeon: Lemar Lofty., MD;  Location: [facility];  Service: Gastroenterology;;    Family History  Problem Relation Age of Onset   Heart disease Father    Colon cancer Neg Hx    Gastric cancer Neg Hx    Esophageal cancer Neg Hx    Social History   Occupational History   Not on file  Tobacco Use   Smoking status: Former    Current packs/day: 0.00    Types: Cigarettes    Start date: 05/24/1967    Quit date: 06/2020    Years since quitting: 2.9   Smokeless tobacco: Never  Vaping Use   Vaping status: Never Used  Substance and Sexual Activity   Alcohol use: Not Currently   Drug use: Not Currently    Types: Marijuana   Sexual activity: Yes    Tobacco Counseling Counseling given: Yes   Immunizations and Health Maintenance Immunization History  Administered Date(s) Administered   Fluad Trivalent(High Dose 65+) 03/06/2023   Influenza,inj,Quad PF,6+ Mos 02/06/2020, 05/03/2021, 02/07/2022   Moderna SARS-COV2 Booster Vaccination 03/02/2020, 12/08/2020   Moderna Sars-Covid-2 Vaccination 08/07/2019, 09/04/2019   Pneumococcal Polysaccharide-23 02/06/2020   Health Maintenance Due  Topic Date Due   DTaP/Tdap/Td (1 - Tdap) Never done   Zoster Vaccines- Shingrix (1 of 2) Never done   COVID-19 Vaccine (3 - Moderna risk series) 01/05/2021   Pneumonia Vaccine 35+ Years old (2 of 2 - PCV) 02/05/2021    Activities of Daily Living    06/06/2023    2:50 PM  In your present  state of health, do you have any difficulty performing the following activities:  Hearing? 0  Vision? 0  Difficulty concentrating or making decisions? 0  Walking or climbing stairs? 0  Dressing or bathing? 0  Doing errands, shopping? 0  Preparing Food and eating ? N  Using the Toilet? N  In the past six months, have you accidently leaked urine? N  Do you have problems with loss of bowel control? N  Managing your Medications? N  Managing your Finances? N  Housekeeping or managing your Housekeeping? N    Physical Exam   Physical Exam Vitals reviewed.  Constitutional:      General: He is not in acute distress.    Appearance: He is cachectic. He is not diaphoretic.     Comments: Muscle wasting  HENT:     Head: Normocephalic and atraumatic.     Nose: Nose normal.     Mouth/Throat:     Mouth: Mucous membranes are moist.     Comments: S/p removal of lower teeth Eyes:     General: No scleral icterus.    Extraocular Movements: Extraocular movements intact.  Neck:     Comments: Neck incision site - C/D/I Cardiovascular:     Rate and Rhythm: Normal rate and regular rhythm.     Pulses: Normal pulses.     Heart sounds: Normal heart sounds. No murmur heard. Pulmonary:     Breath sounds: Normal breath sounds. No wheezing or rales.  Abdominal:     Palpations: Abdomen is soft.     Tenderness: There is no abdominal tenderness.  Musculoskeletal:     Cervical back: Neck supple. No tenderness.     Right knee: Effusion present. Decreased range of motion. Tenderness present.     Right lower leg: No edema.     Left lower leg: No edema.  Skin:    General: Skin is warm.     Findings: No rash.  Neurological:     General: No focal deficit present.     Mental Status: He is alert and oriented to person, place, and time.     Sensory: No sensory deficit.     Motor: No weakness.  Psychiatric:        Mood and Affect: Mood normal.        Behavior: Behavior normal.    Advanced  Directives: Does Patient Have a Medical Advance Directive?: Yes Type of Advance Directive: Healthcare Power of Attorney Does patient want to make changes to medical advance directive?: No - Patient declined   EKG:  normal sinus rhythm, nonspecific ST and T waves changes  Assessment:    This is a routine wellness  examination for this patient.  Vision/Hearing screen No results found.   Goals      DIET - INCREASE LEAN PROTEINS     To eat better, more lean proteins , fruits and vegetables.         Depression Screen    06/06/2023    2:56 PM 06/06/2023    2:53 PM 03/06/2023    2:49 PM 10/31/2022    2:56 PM  PHQ 2/9 Scores  PHQ - 2 Score 0 0 0 3  PHQ- 9 Score   0 12     Fall Risk    06/06/2023    2:55 PM  Fall Risk   Falls in the past year? 0  Number falls in past yr: 0  Injury with Fall? 0  Risk for fall due to : No Fall Risks  Follow up Falls evaluation completed    Cognitive Function        06/06/2023    2:56 PM  6CIT Screen  What Year? 0 points  What month? 0 points  What time? 0 points  Count back from 20 0 points  Months in reverse 2 points  Repeat phrase 2 points  Total Score 4 points    Patient Care Team: Anabel Halon, MD as PCP - General (Internal Medicine) Jena Gauss, Gerrit Friends, MD as Consulting Physician (Gastroenterology) Malmfelt, Lise Auer, RN as Oncology Nurse Navigator Lonie Peak, MD as Consulting Physician (Radiation Oncology) Elfredia Nevins, MD as Consulting Physician (Otolaryngology)     Plan:    Welcome to Medicare preventive visit Screening questionnaire reviewed Physical exam as documented. PCV20 vaccine today. Advised to get Shingrix and Tdap vaccines at local pharmacy. Denies routine blood tests.  Severe protein-calorie malnutrition (HCC) BMI Readings from Last 3 Encounters:  06/06/23 18.87 kg/m  03/06/23 17.94 kg/m  11/07/22 17.79 kg/m   Had severe weight loss, likely due to poor dentition - improving  slowly Now has upper dentures placed, still need lower dentures, advised to eat as tolerated - needs to see Dentist Advised to take protein supplements Needs to take Remeron regularly to improve appetite Sent Remeron again  Chronic apical periodontitis Needs to establish with a Dentist Started Augmentin due to dental pain currently - for dental abscess   I have personally reviewed and noted the following in the patient's chart:   Medical and social history Use of alcohol, tobacco or illicit drugs  Current medications and supplements Functional ability and status Nutritional status Physical activity Advanced directives List of other physicians Hospitalizations, surgeries, and ER visits in previous 12 months Vitals Screenings to include cognitive, depression, and falls Referrals and appointments  In addition, I have reviewed and discussed with patient certain preventive protocols, quality metrics, and best practice recommendations. A written personalized care plan for preventive services as well as general preventive health recommendations were provided to patient.     Anabel Halon, MD 06/06/2023

## 2023-06-06 NOTE — Assessment & Plan Note (Signed)
BMI Readings from Last 3 Encounters:  06/06/23 18.87 kg/m  03/06/23 17.94 kg/m  11/07/22 17.79 kg/m   Had severe weight loss, likely due to poor dentition - improving slowly Now has upper dentures placed, still need lower dentures, advised to eat as tolerated - needs to see Dentist Advised to take protein supplements Needs to take Remeron regularly to improve appetite Sent Remeron again

## 2023-06-06 NOTE — Assessment & Plan Note (Addendum)
Needs to establish with a Dentist Started Augmentin due to dental pain currently - for dental abscess

## 2023-06-28 DIAGNOSIS — S1190XS Unspecified open wound of unspecified part of neck, sequela: Secondary | ICD-10-CM | POA: Diagnosis not present

## 2023-06-28 DIAGNOSIS — C049 Malignant neoplasm of floor of mouth, unspecified: Secondary | ICD-10-CM | POA: Diagnosis not present

## 2023-07-11 ENCOUNTER — Ambulatory Visit: Payer: Self-pay | Admitting: Internal Medicine

## 2023-07-11 ENCOUNTER — Other Ambulatory Visit: Payer: Self-pay

## 2023-07-11 ENCOUNTER — Encounter (HOSPITAL_COMMUNITY): Payer: Self-pay

## 2023-07-11 ENCOUNTER — Emergency Department (HOSPITAL_COMMUNITY)
Admission: EM | Admit: 2023-07-11 | Discharge: 2023-07-11 | Disposition: A | Discharge status: Home / Self Care | Attending: Emergency Medicine | Admitting: Emergency Medicine

## 2023-07-11 DIAGNOSIS — H9192 Unspecified hearing loss, left ear: Secondary | ICD-10-CM | POA: Diagnosis not present

## 2023-07-11 DIAGNOSIS — H6122 Impacted cerumen, left ear: Secondary | ICD-10-CM | POA: Diagnosis not present

## 2023-07-11 MED ORDER — CARBAMIDE PEROXIDE 6.5 % OT SOLN: 5.0000 [drp] | Freq: Two times a day (BID) | OTIC | 0 refills | Status: AC

## 2023-07-11 NOTE — Discharge Instructions (Addendum)
 Use the Debrox to help loosen the wax in your ear and you have been referred to an ear doctor to follow-up within the next 2 to 3 weeks

## 2023-07-11 NOTE — ED Provider Notes (Signed)
 [place] EMERGENCY DEPARTMENT AT [facility] Provider Note   CSN: [id number] Arrival date & time: 07/11/23  1445     History {Add pertinent medical, surgical, social history, OB history to HPI:1} Chief Complaint  Patient presents with   Hearing Loss    Tanner Thomas is a 68 y.o. male.  Patient states he cannot hear well out of his left ear for quite some time.   Ear Fullness       Home Medications Prior to Admission medications   Medication Sig Start Date End Date Taking? Authorizing Provider  carbamide peroxide (DEBROX) 6.5 % OTIC solution Place 5 drops into the left ear 2 (two) times daily. 07/11/23  Yes Bethann Berkshire, MD  acetaminophen (TYLENOL) 500 MG tablet Take 500 mg by mouth every 6 (six) hours as needed for mild pain.    [provider]  amoxicillin-clavulanate (AUGMENTIN) 875-125 MG tablet Take 1 tablet by mouth 2 (two) times daily. 06/06/23   Anabel Halon, MD  celecoxib (CELEBREX) 100 MG capsule Take 1 capsule (100 mg total) by mouth daily. 10/31/22   Anabel Halon, MD  feeding supplement (ENSURE ENLIVE / ENSURE PLUS) LIQD Take 237 mLs by mouth daily. 02/23/22   Anabel Halon, MD  lactose free nutrition (BOOST) LIQD Take 237 mLs by mouth 2 (two) times daily between meals. 02/14/22   Anabel Halon, MD  lidocaine (XYLOCAINE) 2 % solution Patient: Mix 1part 2% viscous lidocaine, 1part H20. Swish & swallow 10mL of diluted mixture, before meals and at bedtime, up to QID 10/11/20   Lonie Peak, MD  mirtazapine (REMERON) 7.5 MG tablet Take 1 tablet (7.5 mg total) by mouth at bedtime. 06/06/23   Anabel Halon, MD  sodium fluoride (SODIUM FLUORIDE 5000 PPM) 1.1 % GEL dental gel Place 1 pea-size drop into each tooth space of fluoride trays once a day at bedtime.  Leave trays in for 5 minutes and then remove.  Spit out excess fluoride, but DO NOT rinse with water, eat or drink for at least 30 minutes after use. 04/27/21   Owsley, Wyn Forster B, DMD   Sodium Fluoride 1.1 % PSTE Place 1 pea-size drop into each tooth space of fluoride trays once a day at bedtime.  Leave trays in for 5 minutes and then remove.  Spit out excess fluoride, but DO NOT rinse with water, eat or drink for at least 30 minutes after use. 03/08/22   Owsley, Wyn Forster B, DMD  VITAMIN E PO Take 1 tablet by mouth in the morning.    [provider]      Allergies    Patient has no known allergies.    Review of Systems   Review of Systems  Physical Exam Updated Vital Signs BP 101/76   Pulse 96   Temp 98.8 F (37.1 C) (Oral)   Resp 18   Ht 5\' 9"  (1.753 m)   Wt 58 kg   SpO2 100%   BMI 18.88 kg/m  Physical Exam  ED Results / Procedures / Treatments   Labs (all labs ordered are listed, but only abnormal results are displayed) Labs Reviewed - No data to display  EKG None  Radiology No results found.  Procedures Procedures  {Document cardiac monitor, telemetry assessment procedure when appropriate:1}  Medications Ordered in ED Medications - No data to display  ED Course/ Medical Decision Making/ A&P   {Patient's left ear was irrigated.  He has significant wax in there. Click here  for ABCD2, HEART and other calculatorsREFRESH Note before signing :1}                              Medical Decision Making Risk OTC drugs.   Decreased hearing to left ear and significant wax in his left ear canal.  He will use Debrox and follow-up with ENT {Document critical care time when appropriate:1} {Document review of labs and clinical decision tools ie heart score, Chads2Vasc2 etc:1}  {Document your independent review of radiology images, and any outside records:1} {Document your discussion with family members, caretakers, and with consultants:1} {Document social determinants of health affecting pt's care:1} {Document your decision making why or why not admission, treatments were needed:1} Final Clinical Impression(s) / ED Diagnoses Final diagnoses:   None    Rx / DC Orders ED Discharge Orders          Ordered    carbamide peroxide (DEBROX) 6.5 % OTIC solution  2 times daily        07/11/23 1926

## 2023-07-11 NOTE — ED Triage Notes (Signed)
 Pt arrived via POV c/o left side hearing loss. Pt reports it has been gradually getting worse X 1 month. Pt called PCP office this morning and advised to seek further evaluation in the ER.

## 2023-07-11 NOTE — Telephone Encounter (Signed)
 Chief Complaint: hearing loss in the L ear Symptoms: hearing loss, speech change Frequency: gradually over 1 month Pertinent Negatives: Patient denies ear pain, drainage, swelling, CP, SOB, blurry vision, headache, facial droop, one-sided weakness Disposition: [x] ED /[] Urgent Care (no appt availability in office) / [] Appointment(In office/virtual)/ []  [place] [facility]/ [] Home Care/ [] Refused Recommended Disposition /[] [place] [facility]/ []  Follow-up with PCP Additional Notes: Pt calls in to report progressive hearing loss in his L ear  over the past month. Pt states he has minimal ability to hear in his L ear. Pt states it is affecting his speech as well. Pt states his speech has been changing over the past month as well. RN able to understand pt on the phone, but it is difficult. Pt denies one-sided weakness, facial droop, blurry vision, headache, CP, SOB, fever, ear pain, drainage, swelling. 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 However, pt states his speech definitely feels different in the last month. Pt seen by ENT 3/6, they looked in his ears but he states they did not say anything.   RN concerned about pt's speech changes as well as loss of hearing in the L ear. RN advised pt he needs to go to the ED to rule out a stroke. Pt agreeable to do that. RN told pt RN would like to call 911, pt refused. Pt states he has a cousin that lives on the same street as him and he will have him take him to [facility]. RN advised pt if he gets any worse he needs to call 911 and pt verbalized understanding.   Copied from CRM [id number]. Topic: Appointments - Appointment Scheduling >> Jul 11, 2023  8:51 AM Louie Casa B wrote: Patient/patient representative is calling to schedule an appointment. Refer to attachments for appointment information.  Patient can not hear out of his left ear Reason for Disposition  Patient sounds  very sick or weak to the triager  Answer Assessment - Initial Assessment Questions 1. DESCRIPTION: "What type of hearing problem are you having? Describe it for me." (e.g., complete hearing loss, partial loss)     Can't hear out of his L ear 2. LOCATION: "One or both ears?" If one, ask: "Which ear?"     L ear 3. SEVERITY: "Can you hear anything?" If Yes, ask: "What can you hear?" (e.g., ticking watch, whisper, talking)   - MILD:  Difficulty hearing soft speech, quiet library sounds, or speech from a distance or over background noise.   - MODERATE: Difficulty hearing normal speech even at closed distances.   - SEVERE: Unable to hear most normal conversation and talking; only able to hear loud sounds such as an alarm clock.     "I can only hear from my R ear" 4. ONSET: "When did this begin?" "Did it start suddenly or come on gradually?"     1 month ago 5. PATTERN: "Does this come and go, or has it been constant since it started?"     Constant - "goes down and down", getting worse 6. PAIN: "Is there any pain in your ear(s)?"  (Scale 1-10; or mild, moderate, severe)   - NONE (0): no pain   - MILD (1-3): doesn't interfere with normal activities    - MODERATE (4-7): interferes with normal activities or awakens from sleep    - SEVERE (8-10): excruciating pain, unable to do any normal activities  Denies pain 7. CAUSE: "What do you think is causing this hearing problem?"     Not sure 8. OTHER SYMPTOMS: "Do you have any other symptoms?" (e.g., dizziness, ringing in ears)     Pt states he isn't "talking right" because he can't hear out of his L ear (patient difficult to understand), "I cough or sneeze and it'll pop and then go back out", denies draining/discharge, no fever, denies dizziness/lightheadedness, does endorse a runny nose but states it is due to pollen. Denies blurry vision or headache. Denies that he ever had ear pain. 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 States he beat cancer. States they had to cut some of his tongue out. Denies one-sided weakness, paralysis, or facial droop. Seen by ENT 3/6. ENT looked in his ears and "didn't say anything." Speech problem started gradually over the past month.  Protocols used: Hearing Loss or Change-A-AH

## 2023-07-11 NOTE — ED Provider Notes (Addendum)
 Patient complains of decreased hearing in his left ear.  Patient not having any pain.  Physical exam patient has significant wax in his left ear.  He will have it irrigated out   Bethann Berkshire, MD 07/11/23 1526    Bethann Berkshire, MD 07/11/23 Kristopher Oppenheim

## 2023-07-13 ENCOUNTER — Telehealth (INDEPENDENT_AMBULATORY_CARE_PROVIDER_SITE_OTHER): Payer: Self-pay | Admitting: Audiology

## 2023-07-13 NOTE — Telephone Encounter (Signed)
 Confirmed 07-13-23 [id number] [phone] let her know mychart may be wrong we have 2 offices

## 2023-08-09 DIAGNOSIS — H40023 Open angle with borderline findings, high risk, bilateral: Secondary | ICD-10-CM | POA: Diagnosis not present

## 2023-08-09 DIAGNOSIS — H25813 Combined forms of age-related cataract, bilateral: Secondary | ICD-10-CM | POA: Diagnosis not present

## 2023-09-13 ENCOUNTER — Encounter (INDEPENDENT_AMBULATORY_CARE_PROVIDER_SITE_OTHER): Payer: Self-pay | Admitting: Otolaryngology

## 2023-09-13 ENCOUNTER — Ambulatory Visit (INDEPENDENT_AMBULATORY_CARE_PROVIDER_SITE_OTHER): Admitting: Audiology

## 2023-09-13 ENCOUNTER — Ambulatory Visit (INDEPENDENT_AMBULATORY_CARE_PROVIDER_SITE_OTHER): Admitting: Otolaryngology

## 2023-09-13 VITALS — BP 118/79 | HR 80 | Ht 70.0 in

## 2023-09-13 DIAGNOSIS — H6122 Impacted cerumen, left ear: Secondary | ICD-10-CM | POA: Diagnosis not present

## 2023-09-13 DIAGNOSIS — H9190 Unspecified hearing loss, unspecified ear: Secondary | ICD-10-CM

## 2023-09-13 DIAGNOSIS — C049 Malignant neoplasm of floor of mouth, unspecified: Secondary | ICD-10-CM | POA: Diagnosis not present

## 2023-09-13 MED ORDER — OFLOXACIN 0.3 % OT SOLN: 4.0000 [drp] | Freq: Two times a day (BID) | OTIC | 1 refills | Status: AC

## 2023-09-13 NOTE — Progress Notes (Signed)
 Dear Dr. Lydia Sams, Here is my assessment for our mutual patient, Tanner Thomas. Thank you for allowing me the opportunity to care for your patient. Please do not hesitate to contact me should you have any other questions. Sincerely, Dr. Milon Aloe  Otolaryngology Clinic Note Referring provider: Dr. Lydia Sams HPI:  Tanner Thomas is a 68 y.o. male kindly referred by Dr. Kelvin Pattee for evaluation of left ear fullness and discomfort with subjective hearing loss.  Initial visit (08/2023): Noted left ear discomfort and fullness and subjective hearing loss noted over past few weeks. He reports intermittent episodes of this and went to ED who noted left ear cerumen impaction and underwent irrigation. Advised to follow up with ENT. He has used debrox as well with some relief. No issues with right ear including pain. No left ear pain. Denies significant noise exposure. 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 Belvin Boys. [facility]) - currently follows with them Personal or FHx of bleeding dz or anesthesia difficulty: no   Independent Review of Additional Tests or Records:  07/11/2023 ED notes (Dr. Bryna Car): Left ear fullness and HL, noted cerumen impaction; Dx: Cerumen impaction; Rx: ref to ENT, debrox Dr. [place] [facility] (06/28/2023) [facility]: FOM SCC s/p resection in 06/29/2020 with bony exposure. Noted NED but continued bony exposure; f/u in 6 months 11/04/2021: TSH/FT4: wnl  PMH/Meds/All/SocHx/FamHx/ROS:   Past Medical History:  Diagnosis Date   Acute upper GI bleed 02/05/2020   Calculus of gallbladder without cholecystitis without obstruction    E coli Pyelonephritis 01/16/2019   Head and neck  lymphadenopathy 05/26/2020   Tobacco use disorder 07/05/2019     Past Surgical History:  Procedure Laterality Date   BILIARY DILATION  10/30/2019   Procedure: BILIARY DILATION;  Surgeon: Janel Medford, MD;  Location: [facility];  Service: Endoscopy;;   BILIARY DILATION  02/05/2020   Procedure: BILIARY DILATION;  Surgeon: Janel Medford, MD;  Location: [facility];  Service: Endoscopy;;   BILIARY STENT PLACEMENT N/A 10/30/2019   Procedure: BILIARY STENT PLACEMENT;  Surgeon: Janel Medford, MD;  Location: [facility];  Service: Endoscopy;  Laterality: N/A;   BILIARY STENT PLACEMENT N/A 02/05/2020   Procedure: BILIARY STENT PLACEMENT;  Surgeon: Janel Medford, MD;  Location: [facility];  Service: Endoscopy;  Laterality: N/A;   BIOPSY  03/06/2019   Procedure: BIOPSY;  Surgeon: Suzette Espy, MD;  [facility];  Service: Endoscopy;;  gastric   BIOPSY  02/05/2020   Procedure: BIOPSY;  Surgeon: Janel Medford, MD;  Location: [facility];  Service: Endoscopy;;  bile buct brushing   BIOPSY  11/14/2021   Procedure: BIOPSY;  Surgeon: Normie Becton., MD;  Location: [facility];  Service: Gastroenterology;;   CHOLECYSTECTOMY N/A 09/05/2019   Procedure: LAPAROSCOPIC CHOLECYSTECTOMY;  Surgeon: Alanda Allegra, MD;  Location: [facility];  Service: General;  Laterality: N/A;   COLONOSCOPY WITH PROPOFOL  N/A 03/06/2019   Procedure: COLONOSCOPY WITH PROPOFOL ;  Surgeon: Suzette Espy, MD;  [facility];  Service: Endoscopy;  Laterality: N/A;  9:45am - ok at 10:00 per Melanie   ENDOSCOPIC RETROGRADE CHOLANGIOPANCREATOGRAPHY (ERCP) WITH PROPOFOL  N/A 10/30/2019   Procedure: ENDOSCOPIC RETROGRADE CHOLANGIOPANCREATOGRAPHY (ERCP) WITH PROPOFOL ;  Surgeon: Howard Macho,  Arleen Lacer, MD;  Location: [facility];  Service: Endoscopy;  Laterality: N/A;   ENDOSCOPIC RETROGRADE CHOLANGIOPANCREATOGRAPHY (ERCP) WITH PROPOFOL  N/A 02/05/2020   Procedure: ENDOSCOPIC RETROGRADE  CHOLANGIOPANCREATOGRAPHY (ERCP) WITH PROPOFOL ;  Surgeon: Janel Medford, MD;  [facility];  Service: Endoscopy;  Laterality: N/A;   ENDOSCOPIC RETROGRADE CHOLANGIOPANCREATOGRAPHY (ERCP) WITH PROPOFOL  N/A 11/14/2021   Procedure: ENDOSCOPIC RETROGRADE CHOLANGIOPANCREATOGRAPHY (ERCP) WITH PROPOFOL ;  Surgeon: Brice Campi Albino Alu., MD;  [facility];  Service: Gastroenterology;  Laterality: N/A;   ERCP N/A 07/14/2021   Procedure: ENDOSCOPIC RETROGRADE CHOLANGIOPANCREATOGRAPHY (ERCP);  Surgeon: Janel Medford, MD;  Location: [facility];  Service: Endoscopy;  Laterality: N/A;   ESOPHAGOGASTRODUODENOSCOPY (EGD) WITH PROPOFOL  N/A 03/06/2019   Procedure: ESOPHAGOGASTRODUODENOSCOPY (EGD) WITH PROPOFOL ;  Surgeon: Suzette Espy, MD;  [facility];  Service: Endoscopy;  Laterality: N/A;   ESOPHAGOGASTRODUODENOSCOPY (EGD) WITH PROPOFOL  N/A 08/21/2019   Procedure: ESOPHAGOGASTRODUODENOSCOPY (EGD) WITH PROPOFOL ;  Surgeon: Janel Medford, MD;  [facility];  Service: Endoscopy;  Laterality: N/A;   EUS N/A 08/21/2019   Procedure: UPPER ENDOSCOPIC ULTRASOUND (EUS) RADIAL;  Surgeon: Janel Medford, MD;  [facility];  Service: Endoscopy;  Laterality: N/A;   PANCREAS SURGERY     REMOVAL OF STONES  10/30/2019   Procedure: REMOVAL OF STONES;  Surgeon: Janel Medford, MD;  [facility];  Service: Endoscopy;;   REMOVAL OF STONES  02/05/2020   Procedure: REMOVAL OF STONES;  Surgeon: Janel Medford, MD;  [facility];  Service: Endoscopy;;   REMOVAL OF STONES  07/14/2021   Procedure: REMOVAL OF SLUDGE;  Surgeon: Janel Medford, MD;  [facility];  Service: Endoscopy;;   REMOVAL OF STONES  11/14/2021   Procedure: REMOVAL OF SLUDGE;  Surgeon: Normie Becton., MD;  Location: [facility];  Service: Gastroenterology;;   [address]  10/30/2019   Procedure: [address];  Surgeon: Janel Medford, MD;  Location: [facility];  Service: Endoscopy;;   SPHINCTEROTOMY  02/05/2020   Procedure: [address];  Surgeon: Janel Medford, MD;  [facility];  Service: Endoscopy;;   SPYGLASS CHOLANGIOSCOPY N/A 02/05/2020   Procedure: ZOXWRUEA CHOLANGIOSCOPY;  Surgeon: Janel Medford, MD;  [facility];  Service: Endoscopy;  Laterality: N/A;   STENT REMOVAL  02/05/2020   Procedure: STENT REMOVAL;  Surgeon: Janel Medford, MD;  [facility];  Service: Endoscopy;;   STENT REMOVAL  11/14/2021   Procedure: STENT REMOVAL;  Surgeon: Normie Becton., MD;  Location: [facility];  Service: Gastroenterology;;    Family History  Problem Relation Age of Onset   Heart disease Father    Colon cancer Neg Hx    Gastric cancer Neg Hx    Esophageal cancer Neg Hx      Social Connections: Moderately Integrated (06/06/2023)   Social Connection and Isolation Panel [NHANES]    Frequency of Communication with Friends and Family: More than three times a week    Frequency of Social Gatherings with Friends and Family: More than three times a week    Attends Religious Services: More than 4 times per year    Active Member of [employer] or Organizations: Yes    Attends Engineer, structural: More than 4 times per year    Marital Status: Never married      Current Outpatient Medications:    acetaminophen  (TYLENOL ) 500 MG tablet, Take 500 mg by mouth every 6 (six) hours as needed for mild pain., Disp: , Rfl:  amoxicillin -clavulanate (AUGMENTIN ) 875-125 MG tablet, Take 1 tablet by mouth 2 (two) times daily., Disp: 14 tablet, Rfl: 0   carbamide peroxide (DEBROX) 6.5 % OTIC solution, Place 5 drops into the left ear 2 (two) times daily., Disp: 15 mL, Rfl: 0   celecoxib  (CELEBREX ) 100 MG capsule, Take 1 capsule (100 mg total) by mouth daily., Disp: 30 capsule, Rfl: 3   feeding supplement (ENSURE ENLIVE / ENSURE PLUS) LIQD, Take 237 mLs by mouth daily., Disp: 7110 mL, Rfl: 3   lactose free  nutrition (BOOST) LIQD, Take 237 mLs by mouth 2 (two) times daily between meals., Disp: 237 mL, Rfl: 10   lidocaine  (XYLOCAINE ) 2 % solution, Patient: Mix 1part 2% viscous lidocaine , 1part H20. Swish & swallow 10mL of diluted mixture, before meals and at bedtime, up to QID, Disp: 300 mL, Rfl: 2   mirtazapine  (REMERON ) 7.5 MG tablet, Take 1 tablet (7.5 mg total) by mouth at bedtime., Disp: 30 tablet, Rfl: 5   sodium fluoride  (SODIUM FLUORIDE  5000 PPM) 1.1 % GEL dental gel, Place 1 pea-size drop into each tooth space of fluoride  trays once a day at bedtime.  Leave trays in for 5 minutes and then remove.  Spit out excess fluoride , but DO NOT rinse with water, eat or drink for at least 30 minutes after use., Disp: 100 mL, Rfl: 11   Sodium Fluoride  1.1 % PSTE, Place 1 pea-size drop into each tooth space of fluoride  trays once a day at bedtime.  Leave trays in for 5 minutes and then remove.  Spit out excess fluoride , but DO NOT rinse with water, eat or drink for at least 30 minutes after use., Disp: 100 mL, Rfl: 6   VITAMIN E PO, Take 1 tablet by mouth in the morning., Disp: , Rfl:    Physical Exam:   BP 118/79 (BP Location: Right Arm, Patient Position: Sitting, Cuff Size: Normal)   Pulse 80   Ht 5\' 10"  (1.778 m)   SpO2 95%   BMI 18.35 kg/m   Salient findings:  Given history and complaints, ear microscopy was indicated and performed for evaluation with findings as below in physical exam section and in procedures; Left ear cerumen impaction; after clearance, Bilateral EAC clear and TM intact with well pneumatized middle ear spaces Weber 512: mid Rinne 512: AC > BC b/l  Anterior rhinoscopy: Septum intact; no purulence Noted postsurgical changes within oral cavity s/p prior FOM cnacer resection; noted 3-4 mm area of exposed mandible just left of midline and right mandible body, mildly tender; no obvious large FOM mass No obviously palpable neck masses; expected woodiness of neck given  post-radiation changes.  No respiratory distress or stridor; tracheostomy site well healed  Seprately Identifiable Procedures:  Prior to initiating any procedures, risks/benefits/alternatives were explained to the patient and verbal consent obtained. Procedure: Bilateral ear microscopy and cerumen removal using microscope (CPT [phone]) - Mod 25 Pre-procedure diagnosis: Cerumen impaction left external ears Post-procedure diagnosis: same Indication: Left cerumen impaction; given patient's otologic complaints and history as well as for improved and comprehensive examination of external ear and tympanic membrane, bilateral otologic examination using microscope was performed and impacted cerumen removed  Procedure: Patient was placed semi-recumbent. Both ear canals were examined using the microscope with findings above. Impacted cerumen removed on left using suction and currette with improvement in EAC examination and patency. Patient tolerated the procedure well.      Impression & Plans:  Tanner Thomas is a 68 y.o. male with:  1. Subjective hearing loss   2. Impacted cerumen of left ear   3. 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 Quentin Brunner recommendations  - f/u as needed with me depending on HT  See below regarding exact medications prescribed this encounter including dosages and route: Meds ordered this encounter  Medications   ofloxacin  (FLOXIN ) 0.3 % OTIC solution    Sig: Place 4 drops into both ears 2 (two) times daily for 7 days.    Dispense:  10 mL    Refill:  1      Thank you for allowing me the opportunity to care for your patient. Please do not hesitate to contact me should you have any  other questions.  Sincerely, Milon Aloe, MD Otolaryngologist (ENT), [facility] Phone: [phone] Fax: [phone]  09/26/2023, 7:30 AM   MDM:  Level 4 Complexity/Problems addressed: mod - chronic problem, acute problem Data complexity: mod - independent review of notes, labs, ordering test - Morbidity: mod  - Prescription Drug prescribed or managed: y

## 2023-09-13 NOTE — Patient Instructions (Signed)
 [phone] -- [facility] Number

## 2023-10-03 ENCOUNTER — Ambulatory Visit (INDEPENDENT_AMBULATORY_CARE_PROVIDER_SITE_OTHER): Admitting: Audiology

## 2023-10-03 DIAGNOSIS — H903 Sensorineural hearing loss, bilateral: Secondary | ICD-10-CM | POA: Diagnosis not present

## 2023-10-05 NOTE — Progress Notes (Addendum)
  [address], [address] [place], [place] [address] [phone]  Audiological Evaluation    Name: Tanner Thomas     DOB:   12/10/1955      MRN:   [id number]                                                                                     Service Date: 10/03/2023     Accompanied by: fiance    Patient comes today after Dr. Lydia Sams, ENT sent a referral for a hearing evaluation due to concerns with hearing loss.   Symptoms Yes Details  Hearing loss  [x]  maybe  Tinnitus  []    Ear pain/ infections/pressure  []    Balance problems  []    Noise exposure history  []    Previous ear surgeries  []    Family history of hearing loss  []    Amplification  []    Other  [x]  Had cancer (mouth) surgery 3 - 4 years ago    Otoscopy: Right ear: Clear external ear canal and notable landmarks visualized on the tympanic membrane. Left ear:  Clear external ear canal and notable landmarks visualized on the tympanic membrane.  Tympanometry: Right ear: Type A- Normal external ear canal volume with normal middle ear pressure and tympanic membrane compliance. Left ear: Type A- Normal external ear canal volume with normal middle ear pressure and tympanic membrane compliance.   Pure tone Audiometry: Both ears- Normal to moderately severe sensorineural hearing loss from 250 Hz - 8000 Hz.  Speech Audiometry: Right ear- Speech Reception Threshold (SRT) was obtained at 30 dBHL. Left ear-Speech Reception Threshold (SRT) was obtained at 30 dBHL.   Word Recognition Score Tested using NU-6 (recorded) Right ear: 84% was obtained at a presentation level of 75 dBHL with contralateral masking which is deemed as  good . Left ear: 84% was obtained at a presentation level of 75 dBHL with contralateral masking which is deemed as  good .   The hearing test results were completed under headphones and results are deemed to be of fair to poor reliability. Test technique:  conventional     Recommendations: Follow up  with ENT as scheduled for today. Return for a hearing evaluation if concerns with hearing changes arise or per MD recommendation. Consider a communication needs assessment after medical clearance for hearing aids is obtained.Recommend to complete a hearing spot check.   Tanner Thomas Tanner Thomas, AUD

## 2023-10-10 ENCOUNTER — Telehealth (INDEPENDENT_AMBULATORY_CARE_PROVIDER_SITE_OTHER): Payer: Self-pay

## 2023-10-10 DIAGNOSIS — H401232 Low-tension glaucoma, bilateral, moderate stage: Secondary | ICD-10-CM | POA: Diagnosis not present

## 2023-10-10 DIAGNOSIS — H25813 Combined forms of age-related cataract, bilateral: Secondary | ICD-10-CM | POA: Diagnosis not present

## 2023-10-10 NOTE — Telephone Encounter (Signed)
-----   Message from Evelina Hippo sent at 10/07/2023 10:07 PM EDT ----- Thanks! Nef would you mind calling him to let him know that his hearing test showed some nerve hearing loss. He can think about hearing aids if he'd like. Otherwise would just check his hearing either with us  or at [facility] in 2 years. Thanks ----- Message ----- From: Glenn Lange, AUD Sent: 10/05/2023  10:03 AM EDT To: Evelina Hippo, MD  A copy of the audiogram is on your desk. Thanks!

## 2023-10-10 NOTE — Telephone Encounter (Signed)
 LVM informing patient to contact office regarding providers notes.

## 2023-10-15 ENCOUNTER — Telehealth (INDEPENDENT_AMBULATORY_CARE_PROVIDER_SITE_OTHER): Payer: Self-pay

## 2023-10-15 NOTE — Telephone Encounter (Signed)
 LVM informing patient to contact office.

## 2023-10-15 NOTE — Telephone Encounter (Signed)
-----   Message from Evelina Hippo sent at 10/07/2023 10:07 PM EDT ----- Thanks! Nef would you mind calling him to let him know that his hearing test showed some nerve hearing loss. He can think about hearing aids if he'd like. Otherwise would just check his hearing either with us  or at [facility] in 2 years. Thanks ----- Message ----- From: Glenn Lange, AUD Sent: 10/05/2023  10:03 AM EDT To: Evelina Hippo, MD  A copy of the audiogram is on your desk. Thanks!

## 2023-10-24 ENCOUNTER — Telehealth (INDEPENDENT_AMBULATORY_CARE_PROVIDER_SITE_OTHER): Payer: Self-pay

## 2023-10-24 NOTE — Telephone Encounter (Signed)
-----   Message from Evelina Hippo sent at 10/07/2023 10:07 PM EDT ----- Thanks! Nef would you mind calling him to let him know that his hearing test showed some nerve hearing loss. He can think about hearing aids if he'd like. Otherwise would just check his hearing either with us  or at [facility] in 2 years. Thanks ----- Message ----- From: Glenn Lange, AUD Sent: 10/05/2023  10:03 AM EDT To: Evelina Hippo, MD  A copy of the audiogram is on your desk. Thanks!

## 2023-10-24 NOTE — Telephone Encounter (Signed)
 Left message with family member for patient to call office regarding providers recommendations.

## 2023-10-25 ENCOUNTER — Encounter (INDEPENDENT_AMBULATORY_CARE_PROVIDER_SITE_OTHER): Payer: Self-pay

## 2023-10-25 ENCOUNTER — Telehealth (INDEPENDENT_AMBULATORY_CARE_PROVIDER_SITE_OTHER): Payer: Self-pay

## 2023-10-25 NOTE — Telephone Encounter (Signed)
 Returned patients call. Spoke with sister and reiterated what Dr. Tobie advises. I provided two options: 1) come back in 2 years for hearing test. 2) Can call [facility], [facility], or [facility] for Hearing aids. Sent message with address and phone# via MyChart

## 2023-11-05 ENCOUNTER — Ambulatory Visit (INDEPENDENT_AMBULATORY_CARE_PROVIDER_SITE_OTHER): Payer: Medicare PPO | Admitting: Internal Medicine

## 2023-11-05 ENCOUNTER — Encounter: Payer: Self-pay | Admitting: Internal Medicine

## 2023-11-05 VITALS — BP 102/64 | HR 92 | Ht 70.0 in | Wt 125.4 lb

## 2023-11-05 DIAGNOSIS — E43 Unspecified severe protein-calorie malnutrition: Secondary | ICD-10-CM

## 2023-11-05 DIAGNOSIS — M1711 Unilateral primary osteoarthritis, right knee: Secondary | ICD-10-CM | POA: Diagnosis not present

## 2023-11-05 DIAGNOSIS — E559 Vitamin D deficiency, unspecified: Secondary | ICD-10-CM | POA: Diagnosis not present

## 2023-11-05 DIAGNOSIS — Z122 Encounter for screening for malignant neoplasm of respiratory organs: Secondary | ICD-10-CM | POA: Insufficient documentation

## 2023-11-05 DIAGNOSIS — C048 Malignant neoplasm of overlapping sites of floor of mouth: Secondary | ICD-10-CM | POA: Diagnosis not present

## 2023-11-05 MED ORDER — MIRTAZAPINE 7.5 MG PO TABS
7.5000 mg | ORAL_TABLET | Freq: Every day | ORAL | 5 refills | Status: AC
Start: 2023-11-05 — End: ?

## 2023-11-05 MED ORDER — CELECOXIB 100 MG PO CAPS: 100.0000 mg | ORAL_CAPSULE | Freq: Every day | ORAL | 3 refills | Status: AC

## 2023-11-05 NOTE — Assessment & Plan Note (Signed)
 S/p surgery and radiotherapy Followed by ENT currently Surgical site well-healed Follows up with dentist for poor dentition as well

## 2023-11-05 NOTE — Progress Notes (Signed)
 Established Patient Office Visit  Subjective:  Patient ID: Tanner Thomas, male    DOB: [birth year]  Age: 68 y.o. MRN: [id number]  CC:  Chief Complaint  Patient presents with   Medical Management of Chronic Issues    6 month f/u    HPI Tanner Thomas is a 68 y.o. 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  He reports chronic right knee pain and swelling.  He has seen Dr. Onesimo in 07/24, and had knee joint aspiration- 80 cc.  He had temporary relief at that time.  His x-ray of knee showed tricompartmental syndrome/OA.  He has tried Tylenol  without much relief.  He was given Celebrex , which had helped with knee pain, but did not get relief.   His weight is overall stable since the last visit.  He is taking protein supplements regularly.  He has dentures in the upper jaw, but can not get lower dentures, according to the dentist. He still eats semisolid or liquid diet. He has lack of appetite, but still does not take Mirtazepine.  Denies any fever, chills, chronic cough, hemoptysis, LAD or night sweats.   Past Medical History:  Diagnosis Date   Acute upper GI bleed 02/05/2020   Calculus of gallbladder without cholecystitis without obstruction    E coli Pyelonephritis 01/16/2019   Head and neck lymphadenopathy 05/26/2020   Tobacco use disorder 07/05/2019    Past Surgical History:  Procedure Laterality Date   BILIARY DILATION  10/30/2019   Procedure: BILIARY DILATION;  Surgeon: Teressa Toribio SQUIBB, MD;  Location: [facility];  Service: Endoscopy;;   BILIARY DILATION  02/05/2020   Procedure: BILIARY DILATION;  Surgeon: Teressa Toribio SQUIBB, MD;  Location: [facility];  Service: Endoscopy;;   BILIARY STENT PLACEMENT N/A 10/30/2019   Procedure: BILIARY STENT PLACEMENT;  Surgeon: Teressa Toribio SQUIBB, MD;  Location: [facility];  Service: Endoscopy;  Laterality:  N/A;   BILIARY STENT PLACEMENT N/A 02/05/2020   Procedure: BILIARY STENT PLACEMENT;  Surgeon: Teressa Toribio SQUIBB, MD;  Location: [facility];  Service: Endoscopy;  Laterality: N/A;   BIOPSY  03/06/2019   Procedure: BIOPSY;  Surgeon: Shaaron Lamar HERO, MD;  [facility];  Service: Endoscopy;;  gastric   BIOPSY  02/05/2020   Procedure: BIOPSY;  Surgeon: Teressa Toribio SQUIBB, MD;  [facility]: [facility];  Service: Endoscopy;;  bile buct brushing   BIOPSY  11/14/2021   Procedure: BIOPSY;  Surgeon: Wilhelmenia Aloha Raddle., MD;  Location: [facility];  Service: Gastroenterology;;   CHOLECYSTECTOMY N/A 09/05/2019   Procedure: LAPAROSCOPIC CHOLECYSTECTOMY;  Surgeon: Mavis Anes, MD;  [facility]: [facility];  Service: General;  Laterality: N/A;   COLONOSCOPY WITH PROPOFOL  N/A 03/06/2019   Procedure: COLONOSCOPY WITH PROPOFOL ;  Surgeon: Shaaron Lamar HERO, MD;  [facility];  Service: Endoscopy;  Laterality: N/A;  9:45am - ok at 10:00 per Melanie   ENDOSCOPIC RETROGRADE CHOLANGIOPANCREATOGRAPHY (ERCP) WITH PROPOFOL  N/A 10/30/2019   Procedure: ENDOSCOPIC RETROGRADE CHOLANGIOPANCREATOGRAPHY (ERCP) WITH PROPOFOL ;  Surgeon: Teressa Toribio SQUIBB, MD;  [facility];  Service: Endoscopy;  Laterality: N/A;   ENDOSCOPIC RETROGRADE CHOLANGIOPANCREATOGRAPHY (ERCP) WITH PROPOFOL  N/A 02/05/2020   Procedure: ENDOSCOPIC RETROGRADE CHOLANGIOPANCREATOGRAPHY (ERCP) WITH PROPOFOL ;  Surgeon: Teressa Toribio SQUIBB, MD;  [facility];  Service: Endoscopy;  Laterality: N/A;   ENDOSCOPIC RETROGRADE CHOLANGIOPANCREATOGRAPHY (ERCP) WITH PROPOFOL  N/A 11/14/2021   Procedure: ENDOSCOPIC RETROGRADE  CHOLANGIOPANCREATOGRAPHY (ERCP) WITH PROPOFOL ;  Surgeon: Mansouraty, Aloha Raddle., MD;  Location: [facility];  Service: Gastroenterology;  Laterality: N/A;   ERCP N/A 07/14/2021   Procedure: ENDOSCOPIC RETROGRADE CHOLANGIOPANCREATOGRAPHY (ERCP);  Surgeon: Teressa Toribio SQUIBB, MD;  Location: [facility];  Service: Endoscopy;   Laterality: N/A;   ESOPHAGOGASTRODUODENOSCOPY (EGD) WITH PROPOFOL  N/A 03/06/2019   Procedure: ESOPHAGOGASTRODUODENOSCOPY (EGD) WITH PROPOFOL ;  Surgeon: Shaaron Lamar HERO, MD;  [facility];  Service: Endoscopy;  Laterality: N/A;   ESOPHAGOGASTRODUODENOSCOPY (EGD) WITH PROPOFOL  N/A 08/21/2019   Procedure: ESOPHAGOGASTRODUODENOSCOPY (EGD) WITH PROPOFOL ;  Surgeon: Teressa Toribio SQUIBB, MD;  [facility];  Service: Endoscopy;  Laterality: N/A;   EUS N/A 08/21/2019   Procedure: UPPER ENDOSCOPIC ULTRASOUND (EUS) RADIAL;  Surgeon: Teressa Toribio SQUIBB, MD;  [facility];  Service: Endoscopy;  Laterality: N/A;   PANCREAS SURGERY     REMOVAL OF STONES  10/30/2019   Procedure: REMOVAL OF STONES;  Surgeon: Teressa Toribio SQUIBB, MD;  [facility];  Service: Endoscopy;;   REMOVAL OF STONES  02/05/2020   Procedure: REMOVAL OF STONES;  Surgeon: Teressa Toribio SQUIBB, MD;  [facility];  Service: Endoscopy;;   REMOVAL OF STONES  07/14/2021   Procedure: REMOVAL OF SLUDGE;  Surgeon: Teressa Toribio SQUIBB, MD;  [facility];  Service: Endoscopy;;   REMOVAL OF STONES  11/14/2021   Procedure: REMOVAL OF SLUDGE;  Surgeon: Wilhelmenia Aloha Raddle., MD;  Location: [facility];  Service: Gastroenterology;;   ANNETT  10/30/2019   Procedure: ANNETT;  Surgeon: Teressa Toribio SQUIBB, MD;  Location: [facility];  Service: Endoscopy;;   SPHINCTEROTOMY  02/05/2020   Procedure: ANNETT;  Surgeon: Teressa Toribio SQUIBB, MD;  [facility];  Service: Endoscopy;;   SPYGLASS CHOLANGIOSCOPY N/A 02/05/2020   Procedure: DEBHOJDD CHOLANGIOSCOPY;  Surgeon: Teressa Toribio SQUIBB, MD;  [facility];  Service: Endoscopy;  Laterality: N/A;   STENT REMOVAL  02/05/2020   Procedure: STENT REMOVAL;  Surgeon: Teressa Toribio SQUIBB, MD;  [facility];  Service: Endoscopy;;   STENT REMOVAL  11/14/2021   Procedure: CLEDA REMOVAL;  Surgeon: Wilhelmenia Aloha Raddle., MD;  Location: [facility];  Service: Gastroenterology;;    Family History  Problem Relation Age of Onset   Heart disease Father    Colon cancer Neg Hx    Gastric cancer Neg Hx    Esophageal cancer Neg Hx     Social History   Socioeconomic History   Marital status: Single    Spouse name: Not on file   Number of children: Not on file   Years of education: Not on file   Highest education level: Not on file  Occupational History   Not on file  Tobacco Use   Smoking status: Former    Current packs/day: 0.00    Types: Cigarettes    Start date: 05/24/1967    Quit date: 06/2020    Years since quitting: 3.3   Smokeless tobacco: Never  Vaping Use   Vaping status: Never Used  Substance and Sexual Activity   Alcohol use: Not Currently   Drug use: Not Currently    Types: Marijuana   Sexual activity: Yes  Other Topics Concern   Not on file  Social History Narrative   Not on file   Social Drivers of Health   Financial Resource Strain: Low Risk  (06/06/2023)   Overall Financial Resource Strain (CARDIA)    Difficulty of Paying Living Expenses: Not hard at all  Food Insecurity: No Food Insecurity (  06/06/2023)   Hunger Vital Sign    Worried About Running Out of Food in the Last Year: Never true    Ran Out of Food in the Last Year: Never true  Transportation Needs: No Transportation Needs (06/06/2023)   PRAPARE - Administrator, Civil Service (Medical): No    Lack of Transportation (Non-Medical): No  Physical Activity: Insufficiently Active (06/06/2023)   Exercise Vital Sign    Days of Exercise per Week: 3 days    Minutes of Exercise per Session: 30 min  Stress: No Stress Concern Present (06/06/2023)   [employer] of Occupational Health - Occupational Stress Questionnaire    Feeling of Stress : Not at all  Social Connections: Moderately Integrated (06/06/2023)   Social Connection and Isolation Panel    Frequency of Communication with Friends and Family: More than three times a  week    Frequency of Social Gatherings with Friends and Family: More than three times a week    Attends Religious Services: More than 4 times per year    Active Member of [employer] or Organizations: Yes    Attends Banker Meetings: More than 4 times per year    Marital Status: Never married  Intimate Partner Violence: Not At Risk (06/06/2023)   Humiliation, Afraid, Rape, and Kick questionnaire    Fear of Current or Ex-Partner: No    Emotionally Abused: No    Physically Abused: No    Sexually Abused: No    Outpatient Medications Prior to Visit  Medication Sig Dispense Refill   acetaminophen  (TYLENOL ) 500 MG tablet Take 500 mg by mouth every 6 (six) hours as needed for mild pain.     carbamide peroxide (DEBROX) 6.5 % OTIC solution Place 5 drops into the left ear 2 (two) times daily. 15 mL 0   feeding supplement (ENSURE ENLIVE / ENSURE PLUS) LIQD Take 237 mLs by mouth daily. 7110 mL 3   lactose free nutrition (BOOST) LIQD Take 237 mLs by mouth 2 (two) times daily between meals. 237 mL 10   lidocaine  (XYLOCAINE ) 2 % solution Patient: Mix 1part 2% viscous lidocaine , 1part H20. Swish & swallow 10mL of diluted mixture, 30min before meals and at bedtime, up to QID 300 mL 2   sodium fluoride  (SODIUM FLUORIDE  5000 PPM) 1.1 % GEL dental gel Place 1 pea-size drop into each tooth space of fluoride  trays once a day at bedtime.  Leave trays in for 5 minutes and then remove.  Spit out excess fluoride , but DO NOT rinse with water, eat or drink for at least 30 minutes after use. 100 mL 11   Sodium Fluoride  1.1 % PSTE Place 1 pea-size drop into each tooth space of fluoride  trays once a day at bedtime.  Leave trays in for 5 minutes and then remove.  Spit out excess fluoride , but DO NOT rinse with water, eat or drink for at least 30 minutes after use. 100 mL 6   VITAMIN E PO Take 1 tablet by mouth in the morning.     amoxicillin -clavulanate (AUGMENTIN ) 875-125 MG tablet Take 1 tablet by mouth 2 (two)  times daily. 14 tablet 0   celecoxib  (CELEBREX ) 100 MG capsule Take 1 capsule (100 mg total) by mouth daily. 30 capsule 3   mirtazapine  (REMERON ) 7.5 MG tablet Take 1 tablet (7.5 mg total) by mouth at bedtime. 30 tablet 5   No facility-administered medications prior to visit.    No Known Allergies  ROS Review of Systems  Constitutional:  Positive for appetite change. Negative for chills and fever.  HENT:  Negative for congestion and sore throat.   Eyes:  Negative for pain and discharge.  Respiratory:  Negative for cough and shortness of breath.   Cardiovascular:  Negative for chest pain and palpitations.  Gastrointestinal:  Negative for constipation, diarrhea, nausea and vomiting.  Endocrine: Negative for polydipsia and polyuria.  Genitourinary:  Negative for dysuria and hematuria.  Musculoskeletal:  Positive for arthralgias. Negative for neck pain and neck stiffness.  Skin:  Negative for rash.  Neurological:  Negative for dizziness, weakness, numbness and headaches.  Psychiatric/Behavioral:  Negative for agitation and behavioral problems.       Objective:    Physical Exam Vitals reviewed.  Constitutional:      General: He is not in acute distress.    Appearance: He is cachectic. He is not diaphoretic.     Comments: Muscle wasting  HENT:     Head: Normocephalic and atraumatic.     Nose: Nose normal.     Mouth/Throat:     Mouth: Mucous membranes are moist.     Comments: S/p removal of lower teeth Eyes:     General: No scleral icterus.    Extraocular Movements: Extraocular movements intact.  Neck:     Comments: Neck incision site - C/D/I Cardiovascular:     Rate and Rhythm: Normal rate and regular rhythm.     Pulses: Normal pulses.     Heart sounds: Normal heart sounds. No murmur heard. Pulmonary:     Breath sounds: Normal breath sounds. No wheezing or rales.  Abdominal:     Palpations: Abdomen is soft.     Tenderness: There is no abdominal tenderness.   Musculoskeletal:     Cervical back: Neck supple. No tenderness.     Right knee: Effusion present. Decreased range of motion. Tenderness present.     Right lower leg: No edema.     Left lower leg: No edema.  Skin:    General: Skin is warm.     Findings: No rash.  Neurological:     General: No focal deficit present.     Mental Status: He is alert and oriented to person, place, and time.     Sensory: No sensory deficit.     Motor: No weakness.  Psychiatric:        Mood and Affect: Mood normal.        Behavior: Behavior normal.     BP 102/64   Pulse 92   Ht 5' 10 (1.778 m)   Wt 125 lb 6.4 oz (56.9 kg)   SpO2 96%   BMI 17.99 kg/m  Wt Readings from Last 3 Encounters:  11/05/23 125 lb 6.4 oz (56.9 kg)  07/11/23 127 lb 13.9 oz (58 kg)  06/06/23 127 lb 12.8 oz (58 kg)    Lab Results  Component Value Date   TSH 3.820 11/04/2021   Lab Results  Component Value Date   WBC 4.9 05/03/2021   HGB 13.0 05/03/2021   HCT 38.6 05/03/2021   MCV 95 05/03/2021   PLT 144 (L) 05/03/2021   Lab Results  Component Value Date   NA 140 05/03/2021   K 4.9 05/03/2021   CO2 19 (L) 05/03/2021   GLUCOSE 88 05/03/2021   BUN 6 (L) 05/03/2021   CREATININE 0.75 (L) 05/03/2021   BILITOT 0.4 05/03/2021   ALKPHOS 83 05/03/2021   AST 65 (H) 05/03/2021   ALT 23 05/03/2021   PROT  7.6 05/03/2021   ALBUMIN 4.4 05/03/2021   CALCIUM 9.0 05/03/2021   ANIONGAP 12 01/17/2021   EGFR 101 05/03/2021   Lab Results  Component Value Date   CHOL 138 03/11/2019   Lab Results  Component Value Date   HDL 44 03/11/2019   Lab Results  Component Value Date   LDLCALC 76 03/11/2019   Lab Results  Component Value Date   TRIG 88 03/11/2019   Lab Results  Component Value Date   CHOLHDL 3.1 03/11/2019   Lab Results  Component Value Date   HGBA1C 5.8 (H) 03/11/2019      Assessment & Plan:   Problem List Items Addressed This Visit       Digestive   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 [facility])   S/p surgery and radiotherapy Followed by ENT currently Surgical site well-healed Follows up with dentist for poor dentition as well        Musculoskeletal and Integument   Primary osteoarthritis of right knee   Has had orthopedic surgery evaluation, had knee aspiration- 80 cc Was advised for right TKA, but he preferred to wait at that time and also needs to gain weight to avoid muscle wasting Celebrex  as needed for pain, also alternate with Tylenol  arthritis      Relevant Medications   celecoxib  (CELEBREX ) 100 MG capsule     Other   Severe protein-calorie malnutrition (HCC) - Primary   BMI Readings from Last 3 Encounters:  11/05/23 17.99 kg/m  09/13/23 18.35 kg/m  07/11/23 18.88 kg/m   Had severe weight loss, likely due to poor dentition - improving slowly Now has upper dentures placed, still can not have lower dentures, advised to eat as tolerated - needs to see Dentist regularly Advised to take protein supplements Needs to take Remeron  regularly to improve appetite Sent Remeron  again      Relevant Medications   mirtazapine  (REMERON ) 7.5 MG tablet   Screening for lung cancer   Has > 20-pack-year smoking history Referred to Pulmonology program for low-dose CT chest after discussing with the patient.      Relevant Orders   Ambulatory Referral Lung Cancer Screening [facility]    Meds ordered this encounter  Medications   mirtazapine  (REMERON ) 7.5 MG tablet    Sig: Take 1 tablet (7.5 mg total) by mouth at bedtime.    Dispense:  30 tablet    Refill:  5   celecoxib  (CELEBREX ) 100 MG capsule    Sig: Take 1 capsule (100 mg total) by mouth daily.    Dispense:  30 capsule    Refill:  3    Follow-up: Return in about 5 months (around 04/06/2024).    Suzzane MARLA Blanch, MD

## 2023-11-05 NOTE — Assessment & Plan Note (Addendum)
 BMI Readings from Last 3 Encounters:  11/05/23 17.99 kg/m  09/13/23 18.35 kg/m  07/11/23 18.88 kg/m   Had severe weight loss, likely due to poor dentition - improving slowly Now has upper dentures placed, still can not have lower dentures, advised to eat as tolerated - needs to see Dentist regularly Advised to take protein supplements Needs to take Remeron  regularly to improve appetite Sent Remeron  again  He has been denying routine blood tests, but agrees now - ordered CBC, CMP, TSH, Vit D

## 2023-11-05 NOTE — Patient Instructions (Signed)
 Please start taking Mirtazepine as prescribed for weight gain.  Please take Celecoxib  as needed for knee pain.  Please continue taking protein supplements.

## 2023-11-05 NOTE — Assessment & Plan Note (Signed)
 Has had orthopedic surgery evaluation, had knee aspiration- 80 cc Was advised for right TKA, but he preferred to wait at that time and also needs to gain weight to avoid muscle wasting Celebrex  as needed for pain, also alternate with Tylenol  arthritis

## 2023-11-05 NOTE — Assessment & Plan Note (Signed)
 Has > 20-pack-year smoking history Referred to Pulmonology program for low-dose CT chest after discussing with the patient.

## 2023-11-08 ENCOUNTER — Telehealth: Payer: Self-pay | Admitting: Acute Care

## 2023-11-08 DIAGNOSIS — Z87891 Personal history of nicotine dependence: Secondary | ICD-10-CM

## 2023-11-08 DIAGNOSIS — F17210 Nicotine dependence, cigarettes, uncomplicated: Secondary | ICD-10-CM

## 2023-11-08 DIAGNOSIS — Z122 Encounter for screening for malignant neoplasm of respiratory organs: Secondary | ICD-10-CM

## 2023-11-08 NOTE — Telephone Encounter (Signed)
 Lung Cancer Screening Narrative/Criteria Questionnaire (Cigarette Smokers Only- No Cigars/Pipes/vapes)   Tanner Thomas   SDMV:11/15/23 at 2pm/KRISTEN                                            1955/09/26               LDCT: 11/28/23 at 430p/[facility]  68 y.o.   Phone: [phone] *sister may help with call but pt will be there  Lung Screening Narrative (confirm age 64-77 yrs Medicare / 50-80 yrs Private pay insurance)   Insurance information:humama   Referring Provider:Patel   This screening involves an initial phone call with a team member from our program. It is called a shared decision making visit. The initial meeting is required by insurance and Medicare to make sure you understand the program. This appointment takes about 15-20 minutes to complete. The CT scan will completed at a separate date/time. This scan takes about 5-10 minutes to complete and you may eat and drink before and after the scan.  Criteria questions for Lung Cancer Screening:   Are you a current or former smoker? Current Age began smoking: 15y   If you are a former smoker, what year did you quit smoking? NA   To calculate your smoking history, I need an accurate estimate of how many packs of cigarettes you smoked per day and for how many years. (Not just the number of PPD you are now smoking)   Years smoking 52 x Packs per day 1/2pack = Pack years 91 *smokes less now   (Make sure they understand that we need to know how much they have smoked in the past, not just the number of PPD they are smoking now)  Do you have a personal history of cancer?  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 Yes  (cancer type and and relative) pat aunt/breast  Are you coughing up blood?  No  Have you had unexplained weight loss of 15 lbs or more in the last 6 months? No  It looks like you meet all  criteria.     Additional information: N/A  *pt has lost weight since his oral surgery d/t eats soft foods only

## 2023-11-14 ENCOUNTER — Encounter

## 2023-11-15 ENCOUNTER — Encounter: Payer: Self-pay | Admitting: *Deleted

## 2023-11-15 ENCOUNTER — Ambulatory Visit: Admitting: *Deleted

## 2023-11-15 DIAGNOSIS — F17210 Nicotine dependence, cigarettes, uncomplicated: Secondary | ICD-10-CM

## 2023-11-15 NOTE — Progress Notes (Signed)
 Virtual Visit via Telephone Note  I connected with Tanner Thomas on 11/15/23 at  2:00 PM EDT by telephone and verified that I am speaking with the correct person using two identifiers.  Location: Patient: in home Provider: [address], [place], [place], [address]     Shared Decision Making Visit Lung Cancer Screening Program [phone])   Eligibility: Age 68 y.o. Pack Years Smoking History Calculation 26 (# packs/per year x # years smoked) Recent History of coughing up blood  no Unexplained weight loss? no ( >Than 15 pounds within the last 6 months ) Prior History Lung / other cancer no (Diagnosis within the last 5 years already requiring surveillance chest CT Scans). Smoking Status Current Smoker Former Smokers: Years since quit: NA  Quit Date: NA  Visit Components: Discussion included one or more decision making aids. yes Discussion included risk/benefits of screening. yes Discussion included potential follow up diagnostic testing for abnormal scans. yes Discussion included meaning and risk of over diagnosis. yes Discussion included meaning and risk of False Positives. yes Discussion included meaning of total radiation exposure. yes  Counseling Included: Importance of adherence to annual lung cancer LDCT screening. yes Impact of comorbidities on ability to participate in the program. yes Ability and willingness to under diagnostic treatment. yes  Smoking Cessation Counseling: Current Smokers:  Discussed importance of smoking cessation. yes Information about tobacco cessation classes and interventions provided to patient. yes Patient provided with ticket for LDCT Scan. yes Symptomatic Patient. no  Counseling NA Diagnosis Code: Tobacco Use Z72.0 Asymptomatic Patient yes  Counseling (Intermediate counseling: > three minutes counseling) [id number] Former Smokers:  Discussed the importance of maintaining cigarette abstinence. yes Diagnosis Code: Personal History of  Nicotine  Dependence. S12.108 Information about tobacco cessation classes and interventions provided to patient. Yes Patient provided with ticket for LDCT Scan. yes Written Order for Lung Cancer Screening with LDCT placed in Epic. Yes (CT Chest Lung Cancer Screening Low Dose W/O CM) [id number] Z12.2-Screening of respiratory organs Z87.891-Personal history of nicotine  dependence   Josette Ranger, RN 11/15/23

## 2023-11-15 NOTE — Patient Instructions (Signed)

## 2023-11-28 ENCOUNTER — Ambulatory Visit (HOSPITAL_COMMUNITY)
Admission: RE | Admit: 2023-11-28 | Discharge: 2023-11-28 | Disposition: A | Discharge status: Home / Self Care | Source: Ambulatory Visit | Attending: Internal Medicine | Admitting: Internal Medicine

## 2023-11-28 DIAGNOSIS — F17210 Nicotine dependence, cigarettes, uncomplicated: Secondary | ICD-10-CM | POA: Insufficient documentation

## 2023-11-28 DIAGNOSIS — Z87891 Personal history of nicotine dependence: Secondary | ICD-10-CM | POA: Diagnosis not present

## 2023-11-28 DIAGNOSIS — Z122 Encounter for screening for malignant neoplasm of respiratory organs: Secondary | ICD-10-CM | POA: Diagnosis not present

## 2023-12-05 NOTE — Progress Notes (Addendum)
 Counseled patient 4 minutes regarding tobacco use.

## 2023-12-14 ENCOUNTER — Telehealth: Payer: Self-pay | Admitting: Acute Care

## 2023-12-14 ENCOUNTER — Encounter: Payer: Self-pay | Admitting: Radiology

## 2023-12-14 NOTE — Telephone Encounter (Signed)
 This patient had head and neck cancer in 2021. He is not cancer free x 5 years. We should have waited a year. Please call him and let him know he needs a 6 month follow up with that history. Please fax results to PCP and let them know plan for care. Thanks so much.

## 2023-12-17 ENCOUNTER — Other Ambulatory Visit: Payer: Self-pay

## 2023-12-17 DIAGNOSIS — Z87891 Personal history of nicotine dependence: Secondary | ICD-10-CM

## 2023-12-17 DIAGNOSIS — R911 Solitary pulmonary nodule: Secondary | ICD-10-CM

## 2023-12-17 DIAGNOSIS — Z122 Encounter for screening for malignant neoplasm of respiratory organs: Secondary | ICD-10-CM

## 2023-12-17 DIAGNOSIS — F17210 Nicotine dependence, cigarettes, uncomplicated: Secondary | ICD-10-CM

## 2023-12-17 NOTE — Telephone Encounter (Addendum)
 Called and spoke with patient's sister Donzell. Reviewed recent CT results. Advised she will discuss them with him. She is in agreement to complete a 6 month follow up scan in early February. She requested a call back closer to the due date as she schedules and transports him to his appointments. Results and plan to PCP. Order placed. Reminder set. NFN.

## 2024-01-14 DIAGNOSIS — C049 Malignant neoplasm of floor of mouth, unspecified: Secondary | ICD-10-CM | POA: Diagnosis not present

## 2024-02-25 ENCOUNTER — Encounter: Payer: Self-pay | Admitting: Radiology

## 2024-03-03 DIAGNOSIS — W208XXA Other cause of strike by thrown, projected or falling object, initial encounter: Secondary | ICD-10-CM | POA: Diagnosis not present

## 2024-03-03 DIAGNOSIS — F17210 Nicotine dependence, cigarettes, uncomplicated: Secondary | ICD-10-CM | POA: Diagnosis not present

## 2024-03-03 DIAGNOSIS — S92424A Nondisplaced fracture of distal phalanx of right great toe, initial encounter for closed fracture: Secondary | ICD-10-CM | POA: Diagnosis not present

## 2024-03-03 DIAGNOSIS — M79671 Pain in right foot: Secondary | ICD-10-CM | POA: Diagnosis not present

## 2024-03-03 DIAGNOSIS — S9030XA Contusion of unspecified foot, initial encounter: Secondary | ICD-10-CM | POA: Diagnosis not present

## 2024-03-04 DIAGNOSIS — R636 Underweight: Secondary | ICD-10-CM | POA: Diagnosis not present

## 2024-03-04 DIAGNOSIS — F17210 Nicotine dependence, cigarettes, uncomplicated: Secondary | ICD-10-CM | POA: Diagnosis not present

## 2024-03-04 DIAGNOSIS — Z681 Body mass index (BMI) 19 or less, adult: Secondary | ICD-10-CM | POA: Diagnosis not present

## 2024-03-04 DIAGNOSIS — F329 Major depressive disorder, single episode, unspecified: Secondary | ICD-10-CM | POA: Diagnosis not present

## 2024-03-04 DIAGNOSIS — Z85818 Personal history of malignant neoplasm of other sites of lip, oral cavity, and pharynx: Secondary | ICD-10-CM | POA: Diagnosis not present

## 2024-03-12 DIAGNOSIS — M79671 Pain in right foot: Secondary | ICD-10-CM | POA: Diagnosis not present

## 2024-03-12 DIAGNOSIS — S82891A Other fracture of right lower leg, initial encounter for closed fracture: Secondary | ICD-10-CM | POA: Diagnosis not present

## 2024-03-12 DIAGNOSIS — M25571 Pain in right ankle and joints of right foot: Secondary | ICD-10-CM | POA: Diagnosis not present

## 2024-03-14 DIAGNOSIS — S82891A Other fracture of right lower leg, initial encounter for closed fracture: Secondary | ICD-10-CM | POA: Diagnosis not present

## 2024-03-14 DIAGNOSIS — S8251XA Displaced fracture of medial malleolus of right tibia, initial encounter for closed fracture: Secondary | ICD-10-CM | POA: Diagnosis not present

## 2024-03-14 DIAGNOSIS — S82431A Displaced oblique fracture of shaft of right fibula, initial encounter for closed fracture: Secondary | ICD-10-CM | POA: Diagnosis not present

## 2024-03-14 DIAGNOSIS — M25571 Pain in right ankle and joints of right foot: Secondary | ICD-10-CM | POA: Diagnosis not present

## 2024-03-14 DIAGNOSIS — S82841A Displaced bimalleolar fracture of right lower leg, initial encounter for closed fracture: Secondary | ICD-10-CM | POA: Diagnosis not present

## 2024-03-17 DIAGNOSIS — Z8781 Personal history of (healed) traumatic fracture: Secondary | ICD-10-CM | POA: Diagnosis not present

## 2024-03-17 DIAGNOSIS — M79671 Pain in right foot: Secondary | ICD-10-CM | POA: Diagnosis not present

## 2024-03-17 DIAGNOSIS — S82891D Other fracture of right lower leg, subsequent encounter for closed fracture with routine healing: Secondary | ICD-10-CM | POA: Diagnosis not present

## 2024-03-19 DIAGNOSIS — F17210 Nicotine dependence, cigarettes, uncomplicated: Secondary | ICD-10-CM | POA: Diagnosis not present

## 2024-03-19 DIAGNOSIS — S82841A Displaced bimalleolar fracture of right lower leg, initial encounter for closed fracture: Secondary | ICD-10-CM | POA: Diagnosis not present

## 2024-03-19 DIAGNOSIS — N189 Chronic kidney disease, unspecified: Secondary | ICD-10-CM | POA: Diagnosis not present

## 2024-04-07 ENCOUNTER — Ambulatory Visit: Admitting: Internal Medicine

## 2024-04-22 NOTE — Discharge Summary (Signed)
 " Discharge Summary  Admit date: 04/09/2024  Discharge date and time: 04/22/24  Discharge to:  Home  Discharge Service: Orthopedics [facility])  Discharge Attending Physician: Manus Dawn Stetler, DO  Discharge  Diagnoses: Right ankle fracture status post revision ORIF  Secondary Diagnosis: Active Problems:   * No active hospital problems. * Resolved Problems:   * No resolved hospital problems. *   OR Procedures:   Right - REPAIR OF FIBULA NONUNION AND/OR MALUNION WITH INTERNAL FIXATION **REVISION** Date 04/09/2024 -------------------   Ancillary Procedures: Right knee aspirate x 2 with no growth to date at time of discharge.  Discharge Day Services:  Subjective  No acute events overnight. Pain Controlled. No fever or chills.  Objective Patient Vitals for the past 8 hrs:  BP Temp Temp src Pulse Resp SpO2 Weight  04/22/24 0911 -- -- -- -- -- 92 % --  04/22/24 0815 114/77 36.5 C (97.7 F) Oral 60 15 100 % --  04/22/24 0615 -- -- -- -- -- -- 53.6 kg (118 lb 2.7 oz)   I/O this shift: In: 834 [P.O.:834] Out: -   Physical Exam: General:  Alert and oriented, No acute distress Extremity/wound location: right lower  Incision: dressing clean, dry, and intact  Neurovascular: Distal affected extremity is well perfused. Intact sensation to the affected foot in the superficial and deep peroneal, sural, saphenous and tibial nerve distribution.  Hospital Course: Patient is a pleasant 68 year old male who was admitted to the hospital on 04/09/2024 after undergoing revision right ankle ORIF for displacement of his fracture and hardware failure from noncompliance to his weightbearing restrictions.  She remained in the hospital for ongoing monitoring and evaluation by therapy for consideration of placement in a rehabilitation/skilled nursing facility in the postoperative period.  He had increased knee pain and a large effusion for which 2 separate aspirations were performed on 04/14/2024  and 04/16/2024 demonstrating no bacterial culture growth and no crystals.  Inflammatory labs had improved during the time of his stay as had his knee and ankle pain.  He did not meet qualifications for skilled nursing placement and had progressed well with therapy while being able to maintain his nonweightbearing restrictions.  He was ultimately discharged on 04/22/2024 with nonweightbearing to his operative extremity and plan follow-up with orthopedics for 1 week.  Condition at Discharge: Improved Discharge Medications:    Your Medication List     START taking these medications    docusate sodium  100 MG capsule Commonly known as: COLACE Take 1 capsule (100 mg total) by mouth two (2) times a day as needed for constipation.       CONTINUE taking these medications    acetaminophen  500 MG tablet Commonly known as: TYLENOL  EXTRA STRENGTH Take 2 tablets (1,000 mg total) by mouth every six (6) hours as needed for up to 14 days.   aspirin 81 MG tablet Commonly known as: ECOTRIN Take 1 tablet (81 mg total) by mouth two (2) times a day.   methocarbamol 500 MG tablet Commonly known as: ROBAXIN Take 2 tablets (1,000 mg total) by mouth Three (3) times a day as needed (muscle spasms).   oxyCODONE  5 MG immediate release tablet Commonly known as: ROXICODONE  Take 1 tablet (5 mg total) by mouth every four (4) hours as needed for pain for up to 5 days.        Pending Test Results:   Discharge Instructions:   Other Instructions     Discharge instructions     Discharge instructions: -  Non-weightbearing to the operative extremity.  Maintain your immobilization at all times except for gentle ankle range of motion exercises as demonstrated in the hospital. - Ice and elevate for pain and swelling (20 minutes on, 20 minutes off). Do not place ice directly on the skin. Keep a cloth barrier between skin and ice to avoid cold-induced injury. Check the skin periodically. - Take medication as  prescribed - If you have a splint/dressing and it gets wet or comes off, do not attempt to reapply or dry, call the office so you can be seen as soon as possible - You may perform daily sterile dressing changes to your incisions.  Do not get the incisions wet until cleared by orthopedic team. - Monitor for signs and symptoms of infection (redness, swelling, drainage, fevers, or chills) and call the office or go to the ER if they occur - Call the office or go to the ER if there are any signs/symptoms of compartment syndrome (firm/tight extremity, loss of color to extremity, increasing numbness/tingling, cool extremity with no capillary refill - as demonstrated, pain that is not controlled with pain medication)  - Follow-up in clinic in approximately 1 week. Call the office to schedule or confirm your appointment - Call the office [phone]) or Dr. 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 Suzzane Blanch   Disciplines requested: Physical Therapy   Physical Therapy requested: Evaluate and treat   Requested [facility] Date: 04/23/2024   Requested follow up plan: You would evaluate and manage.   Discharge instructions        "

## 2024-04-30 ENCOUNTER — Inpatient Hospital Stay: Payer: Self-pay

## 2024-05-06 ENCOUNTER — Inpatient Hospital Stay: Payer: Self-pay | Admitting: Internal Medicine

## 2024-05-08 ENCOUNTER — Telehealth: Payer: Self-pay

## 2024-05-08 NOTE — Telephone Encounter (Signed)
 LVM to schedule 6 month follow up CT.

## 2024-05-09 ENCOUNTER — Inpatient Hospital Stay: Admitting: Family Medicine

## 2024-05-14 ENCOUNTER — Ambulatory Visit: Admitting: Internal Medicine

## 2024-05-31 ENCOUNTER — Ambulatory Visit (HOSPITAL_COMMUNITY): Admission: RE | Admit: 2024-05-31

## 2024-06-10 ENCOUNTER — Ambulatory Visit

## 2024-06-17 ENCOUNTER — Ambulatory Visit: Admitting: Internal Medicine

## 2024-07-14 ENCOUNTER — Inpatient Hospital Stay: Payer: Self-pay | Admitting: Internal Medicine
